# Patient Record
Sex: Male | Born: 1935 | Race: White | Hispanic: No | Smoking: Former smoker
Health system: Southern US, Community
[De-identification: ages and names within clinical notes are randomized; demographics above are authoritative.]

## PROBLEM LIST (undated history)

## (undated) DIAGNOSIS — M1990 Unspecified osteoarthritis, unspecified site: Secondary | ICD-10-CM

## (undated) DIAGNOSIS — M25511 Pain in right shoulder: Secondary | ICD-10-CM

## (undated) DIAGNOSIS — I4891 Unspecified atrial fibrillation: Secondary | ICD-10-CM

## (undated) DIAGNOSIS — C801 Malignant (primary) neoplasm, unspecified: Secondary | ICD-10-CM

## (undated) DIAGNOSIS — I10 Essential (primary) hypertension: Secondary | ICD-10-CM

## (undated) HISTORY — PX: CATARACT EXTRACTION W/ INTRAOCULAR LENS IMPLANT: SHX1309

## (undated) HISTORY — PX: OTHER SURGICAL HISTORY: SHX169

---

## 2012-09-23 ENCOUNTER — Other Ambulatory Visit (HOSPITAL_BASED_OUTPATIENT_CLINIC_OR_DEPARTMENT_OTHER): Payer: Self-pay | Admitting: Family Medicine

## 2012-09-23 DIAGNOSIS — M5412 Radiculopathy, cervical region: Secondary | ICD-10-CM

## 2012-09-26 ENCOUNTER — Ambulatory Visit (HOSPITAL_BASED_OUTPATIENT_CLINIC_OR_DEPARTMENT_OTHER)
Admission: RE | Admit: 2012-09-26 | Discharge: 2012-09-26 | Disposition: A | Discharge status: Home / Self Care | Payer: Medicare Other | Source: Ambulatory Visit | Attending: Family Medicine | Admitting: Family Medicine

## 2012-09-26 DIAGNOSIS — M5412 Radiculopathy, cervical region: Secondary | ICD-10-CM

## 2012-09-30 ENCOUNTER — Encounter (HOSPITAL_BASED_OUTPATIENT_CLINIC_OR_DEPARTMENT_OTHER): Payer: Self-pay | Admitting: *Deleted

## 2012-09-30 ENCOUNTER — Observation Stay (HOSPITAL_COMMUNITY)
Admission: EM | Admit: 2012-09-30 | Discharge: 2012-10-01 | Disposition: A | Discharge status: Home / Self Care | Payer: Medicare Other | Attending: Internal Medicine | Admitting: Internal Medicine

## 2012-09-30 ENCOUNTER — Emergency Department (HOSPITAL_BASED_OUTPATIENT_CLINIC_OR_DEPARTMENT_OTHER)
Admission: EM | Admit: 2012-09-30 | Discharge: 2012-09-30 | Disposition: A | Discharge status: Home / Self Care | Payer: Medicare Other | Attending: Emergency Medicine | Admitting: Emergency Medicine

## 2012-09-30 ENCOUNTER — Encounter (HOSPITAL_COMMUNITY): Payer: Self-pay | Admitting: Emergency Medicine

## 2012-09-30 DIAGNOSIS — I4891 Unspecified atrial fibrillation: Secondary | ICD-10-CM | POA: Insufficient documentation

## 2012-09-30 DIAGNOSIS — R000 Tachycardia, unspecified: Secondary | ICD-10-CM | POA: Insufficient documentation

## 2012-09-30 DIAGNOSIS — Z8679 Personal history of other diseases of the circulatory system: Secondary | ICD-10-CM | POA: Insufficient documentation

## 2012-09-30 DIAGNOSIS — R040 Epistaxis: Secondary | ICD-10-CM | POA: Insufficient documentation

## 2012-09-30 DIAGNOSIS — Z7901 Long term (current) use of anticoagulants: Secondary | ICD-10-CM | POA: Insufficient documentation

## 2012-09-30 DIAGNOSIS — E871 Hypo-osmolality and hyponatremia: Secondary | ICD-10-CM | POA: Insufficient documentation

## 2012-09-30 DIAGNOSIS — R42 Dizziness and giddiness: Secondary | ICD-10-CM | POA: Insufficient documentation

## 2012-09-30 DIAGNOSIS — I10 Essential (primary) hypertension: Secondary | ICD-10-CM | POA: Insufficient documentation

## 2012-09-30 DIAGNOSIS — Z79899 Other long term (current) drug therapy: Secondary | ICD-10-CM | POA: Insufficient documentation

## 2012-09-30 DIAGNOSIS — E860 Dehydration: Secondary | ICD-10-CM | POA: Diagnosis present

## 2012-09-30 DIAGNOSIS — E875 Hyperkalemia: Secondary | ICD-10-CM | POA: Diagnosis present

## 2012-09-30 DIAGNOSIS — M25511 Pain in right shoulder: Secondary | ICD-10-CM

## 2012-09-30 HISTORY — DX: Pain in right shoulder: M25.511

## 2012-09-30 HISTORY — DX: Unspecified atrial fibrillation: I48.91

## 2012-09-30 HISTORY — DX: Unspecified osteoarthritis, unspecified site: M19.90

## 2012-09-30 HISTORY — DX: Malignant (primary) neoplasm, unspecified: C80.1

## 2012-09-30 HISTORY — DX: Essential (primary) hypertension: I10

## 2012-09-30 LAB — BASIC METABOLIC PANEL
GFR calc Af Amer: 84 mL/min — ABNORMAL LOW (ref >90)
GFR calc non Af Amer: 72 mL/min — ABNORMAL LOW (ref >90)
Potassium: 5.3 mEq/L — ABNORMAL HIGH (ref 3.5–5.1)
Sodium: 133 mEq/L — ABNORMAL LOW (ref 135–145)

## 2012-09-30 LAB — PROTIME-INR
INR: 1.79 — ABNORMAL HIGH (ref 0.00–1.49)
Prothrombin Time: 20.2 seconds — ABNORMAL HIGH (ref 11.6–15.2)

## 2012-09-30 LAB — CBC WITH DIFFERENTIAL/PLATELET
Basophils Relative: 0 % (ref 0–1)
Eosinophils Absolute: 0.1 10*3/uL (ref 0.0–0.7)
Hemoglobin: 14.6 g/dL (ref 13.0–17.0)
Lymphs Abs: 2.1 10*3/uL (ref 0.7–4.0)
Lymphs Abs: 2.7 10*3/uL (ref 0.7–4.0)
MCH: 31.1 pg (ref 26.0–34.0)
Monocytes Relative: 10 % (ref 3–12)
Neutro Abs: 9.2 10*3/uL — ABNORMAL HIGH (ref 1.7–7.7)
Neutrophils Relative %: 65 % (ref 43–77)
Neutrophils Relative %: 69 % (ref 43–77)
Platelets: 206 10*3/uL (ref 150–400)
RBC: 4.7 MIL/uL (ref 4.22–5.81)
RBC: 4.84 MIL/uL (ref 4.22–5.81)

## 2012-09-30 MED ORDER — SODIUM CHLORIDE 0.9 % IV SOLN: INTRAVENOUS | Status: DC

## 2012-09-30 MED ORDER — METOPROLOL SUCCINATE ER 25 MG PO TB24
25.0000 mg | ORAL_TABLET | Freq: Every day | ORAL | Status: DC
  Administered 2012-10-01: 25 mg via ORAL
  Filled 2012-09-30: qty 1

## 2012-09-30 MED ORDER — ACETAMINOPHEN 650 MG RE SUPP: 650.0000 mg | Freq: Four times a day (QID) | RECTAL | Status: DC | PRN

## 2012-09-30 MED ORDER — SODIUM CHLORIDE 0.9 % IV SOLN
INTRAVENOUS | Status: DC
  Administered 2012-09-30 – 2012-10-01 (×2): via INTRAVENOUS

## 2012-09-30 MED ORDER — SODIUM CHLORIDE 0.9 % IV SOLN
INTRAVENOUS | Status: DC
  Administered 2012-09-30: 20:00:00 via INTRAVENOUS

## 2012-09-30 MED ORDER — TRAMADOL HCL 50 MG PO TABS: 50.0000 mg | ORAL_TABLET | Freq: Four times a day (QID) | ORAL | Status: DC | PRN

## 2012-09-30 MED ORDER — ONDANSETRON HCL 4 MG/2ML IJ SOLN: 4.0000 mg | Freq: Four times a day (QID) | INTRAMUSCULAR | Status: DC | PRN

## 2012-09-30 MED ORDER — ACETAMINOPHEN 325 MG PO TABS: 650.0000 mg | ORAL_TABLET | Freq: Four times a day (QID) | ORAL | Status: DC | PRN

## 2012-09-30 MED ORDER — SODIUM CHLORIDE 0.9 % IJ SOLN
3.0000 mL | Freq: Two times a day (BID) | INTRAMUSCULAR | Status: DC
  Administered 2012-09-30: 3 mL via INTRAVENOUS

## 2012-09-30 MED ORDER — ONDANSETRON HCL 4 MG PO TABS: 4.0000 mg | ORAL_TABLET | Freq: Four times a day (QID) | ORAL | Status: DC | PRN

## 2012-09-30 MED ORDER — ALBUTEROL SULFATE (5 MG/ML) 0.5% IN NEBU: 2.5000 mg | INHALATION_SOLUTION | RESPIRATORY_TRACT | Status: DC | PRN

## 2012-09-30 MED ORDER — SODIUM CHLORIDE 0.9 % IV SOLN
INTRAVENOUS | Status: DC
  Administered 2012-09-30: 22:00:00 via INTRAVENOUS

## 2012-09-30 MED ORDER — OXYMETAZOLINE HCL 0.05 % NA SOLN
NASAL | Status: AC
  Filled 2012-09-30: qty 15

## 2012-09-30 MED ORDER — OXYMETAZOLINE HCL 0.05 % NA SOLN
3.0000 | Freq: Once | NASAL | Status: AC
  Administered 2012-09-30: 3 via NASAL

## 2012-09-30 MED ORDER — EZETIMIBE-SIMVASTATIN 10-80 MG PO TABS
1.0000 | ORAL_TABLET | Freq: Every day | ORAL | Status: DC
  Administered 2012-10-01: 1 via ORAL
  Filled 2012-09-30 (×2): qty 1

## 2012-09-30 MED ORDER — SODIUM CHLORIDE 0.9 % IV BOLUS (SEPSIS)
500.0000 mL | Freq: Once | INTRAVENOUS | Status: AC
  Administered 2012-09-30: 500 mL via INTRAVENOUS

## 2012-09-30 NOTE — ED Notes (Signed)
MD at bedside. 

## 2012-09-30 NOTE — ED Notes (Signed)
Rhino rocket inserted into left nare by Dr Read Drivers, pt tolerated well.

## 2012-09-30 NOTE — ED Notes (Signed)
Unit Floor RN (Rm [id number]) unavailable for report at this time-- will be available in 15 minutes.

## 2012-09-30 NOTE — ED Notes (Signed)
Pt rec'd Afrin nasal spray by EMS prior to arrival.

## 2012-09-30 NOTE — ED Provider Notes (Signed)
History     CSN: [id number]  Arrival date & time 09/30/12  0555   First MD Initiated Contact with Patient 09/30/12 [phone]      Chief Complaint  Patient presents with  . Epistaxis    (Consider location/radiation/quality/duration/timing/severity/associated sxs/prior treatment) HPI This is an 78 year old male who awoke from sleep about 2 hours ago with left-sided epistaxis. He is not aware of any trauma to his nose. He states the bleeding was severe earlier but it is improved. EMS sprayed Afrin into his nose without relief. He continues to bleed on arrival. He denies pain. He states he is lightheaded but denies chest pain or shortness of breath. He denies nausea or vomiting.  Past Medical History  Diagnosis Date  . Hypertension   . Atrial fibrillation     History reviewed. No pertinent past surgical history.  History reviewed. No pertinent family history.  History  Substance Use Topics  . Smoking status: Not on file  . Smokeless tobacco: Not on file  . Alcohol Use: No      Review of Systems  All other systems reviewed and are negative.    Allergies  Penicillins  Home Medications   Current Outpatient Rx  Name Route Sig Dispense Refill  . CELECOXIB 100 MG PO CAPS Oral Take 100 mg by mouth 2 (two) times daily.    Marland Kitchen EZETIMIBE-SIMVASTATIN 10-10 MG PO TABS Oral Take 1 tablet by mouth at bedtime.    Marland Kitchen METOPROLOL SUCCINATE ER 100 MG PO TB24 Oral Take 100 mg by mouth daily. Take with or immediately following a meal.    . RIVAROXABAN 10 MG PO TABS Oral Take by mouth daily.    . TRAMADOL HCL 50 MG PO TABS Oral Take 50 mg by mouth every 6 (six) hours as needed.    . WARFARIN SODIUM 5 MG PO TABS Oral Take 5 mg by mouth daily.      BP 126/91  Pulse 73  Temp 98.6 F (37 C) (Oral)  Resp 22  SpO2 98%  Physical Exam General: Well-developed, well-nourished male in no acute distress; appearance consistent with age of record HENT: normocephalic, atraumatic; large clot in left  naris with active bleeding Eyes: pupils equal round and reactive to light; extraocular muscles intact; lens implant Neck: supple Heart: regular rate and rhythm; no murmurs Lungs: clear to auscultation bilaterally Abdomen: soft; nondistended; nontender Extremities: No deformity; full range of motion Neurologic: Awake, alert and oriented; motor function intact in all extremities and symmetric; no facial droop Skin: Warm and dry Psychiatric: Normal mood and affect    ED Course  Procedures (including critical care time)   NASAL PACKING After verbal informed consent was obtained a 5.5 cm Rapid Rhino was soaked in normal saline for a minute then placed into the left nostril and the balloon inflated with 5 mL of air. The patient tolerated this well and there were no complications. He appears hemostatic at this time (6:10 AM). Patient coughed up a large clot after placement of packing.   MDM   Nursing notes and vitals signs, including pulse oximetry, reviewed.  Summary of this visit's results, reviewed by myself:  Labs:  Results for orders placed during the hospital encounter of 09/30/12  PROTIME-INR      Component Value Range   Prothrombin Time 20.2 (*) 11.6 - 15.2 seconds   INR 1.79 (*) 0.00 - 1.49  CBC WITH DIFFERENTIAL      Component Value Range   WBC 9.3  4.0 -  10.5 K/uL   RBC 4.70  4.22 - 5.81 MIL/uL   Hemoglobin 14.6  13.0 - 17.0 g/dL   HCT 65.7  84.6 - 96.2 %   MCV 92.8  78.0 - 100.0 fL   MCH 31.1  26.0 - 34.0 pg   MCHC 33.5  30.0 - 36.0 g/dL   RDW 95.2  84.1 - 32.4 %   Platelets 206  150 - 400 K/uL   Neutrophils Relative 65  43 - 77 %   Neutro Abs 6.0  1.7 - 7.7 K/uL   Lymphocytes Relative 23  12 - 46 %   Lymphs Abs 2.1  0.7 - 4.0 K/uL   Monocytes Relative 11  3 - 12 %   Monocytes Absolute 1.0  0.1 - 1.0 K/uL   Eosinophils Relative 1  0 - 5 %   Eosinophils Absolute 0.1  0.0 - 0.7 K/uL   Basophils Relative 0  0 - 1 %   Basophils Absolute 0.0  0.0 - 0.1 K/uL    6:39 AM Hemostatic. Patient states his right nostril feels stuffed up, will administer Afrin.          Hanley Seamen, MD 09/30/12 [phone]

## 2012-09-30 NOTE — H&P (Addendum)
[facility] History and Physical  [name] [name] QMV:[id number] DOB: 07/25/1923 DOA: 09/30/2012  Referring physician: Dr. Cheri Guppy PCP: [name] Balls, MD  Specialists: None  Chief Complaint: Nosebleeding  HPI: [name] [name] is a 78 y.o. male with history of HTN, HDL, A. fib on Xarelto, colon cancer surgery presented to the ED for the second time with nosebleeding. He woke up at approximately 4 AM with bleeding from his left nostril. He described this as spurts of significant amount of blood. This was not controlled by squeezing nose with napkin. No history of trauma or use of heater at home. He presented to [facility]. Left nostril was packed with a Rhino Rocket and patient was discharged home. On returning home he continued to have mild trickle of blood from both nostrils but significantly improved compared to prior ED visit. However he also noticed trickling of blood in his throat with associated gagging. He called the ED and was advised to return and presented to the [facility] long emergency department. He continues to have mild blood on napkin when he sneezes. He complains of headache and lightheadedness. He has not eaten well all day. The hospitalist service is requested to admit him for further evaluation and management. He gives history of similar complaints approximately 3 weeks ago that subsided spontaneously at home.   Review of Systems: All systems reviewed and apart from history of presenting illness, are negative  Past Medical History  Diagnosis Date  . Hypertension   . Atrial fibrillation   . Cancer     colon ca - surgery, no chemo or radiation  . Arthritis   . Right shoulder pain 09/30/12    recently   Past Surgical History  Procedure Date  . Colon surgery for colon cancer approx. 2006  . Cataract extraction w/ intraocular lens implant     b/l   Social History:  reports that he quit smoking about 23 years ago. His smoking use included Cigarettes. He quit  after 35 years of use. He has never used smokeless tobacco. He reports that he does not drink alcohol or use illicit drugs. Patient is widowed. He lives alone and is independent of activities of daily living.  Allergies  Allergen Reactions  . Penicillins     Family History  Problem Relation Age of Onset  . Heart attack Father     Prior to Admission medications   Medication Sig Start Date End Date Taking? Authorizing Provider  CALCIUM PO Take 1 tablet by mouth daily.   Yes Historical Provider, MD  ezetimibe-simvastatin (VYTORIN) 10-80 MG per tablet Take 1 tablet by mouth at bedtime.   Yes Historical Provider, MD  losartan-hydrochlorothiazide (HYZAAR) 100-25 MG per tablet Take 1 tablet by mouth daily. E, calcium, zinc, fish oil   Yes Historical Provider, MD  metoprolol succinate (TOPROL-XL) 25 MG 24 hr tablet Take 25 mg by mouth daily.   Yes Historical Provider, MD  Multiple Vitamins-Minerals (ZINC PO) Take 1 tablet by mouth daily.   Yes Historical Provider, MD  Omega-3 Fatty Acids (FISH OIL PO) Take 1 capsule by mouth daily.   Yes Historical Provider, MD  Rivaroxaban (XARELTO) 20 MG TABS Take 20 mg by mouth daily.   Yes Historical Provider, MD  traMADol (ULTRAM) 50 MG tablet Take 50-100 mg by mouth every 6 (six) hours as needed. For pain   Yes Historical Provider, MD  VITAMIN E PO Take 1 capsule by mouth daily.   Yes Historical Provider, MD   Physical  Exam: Filed Vitals:   09/30/12 1638 09/30/12 2213  BP: 138/59 136/66  Pulse:  117  Temp: 98.2 F (36.8 C) 98.4 F (36.9 C)  TempSrc: Oral Oral  Resp: 18 22  SpO2: 100% 94%     General exam: Moderately built and nourished male patient was lying comfortably on the gurney and is in no obvious distress.  Head, eyes and ENT: Nontraumatic and normocephalic. Left nostril is packed with the Rhino Rocket-mildly soaked with blood. Mild dried blood in the left nostril. No blood-dried or fresh seen in the posterior pharyngeal wall. No  obvious active bleeding visible at this time oral mucosa is dry.   Neck: Supple. No JVD, carotid bruit or thyromegaly.  Lymphatics: No lymphadenopathy.  Respiratory system: Clear. No increased work of breathing.  Cardiovascular system: First and second heart sounds heard, regular. Mildly tachycardic. No JVD, murmurs or gallops.  Gastrointestinal system: Abdomen is nondistended, soft and nontender. Normal bowel sounds heard. No organomegaly or masses appreciated. Midline laparotomy scar.  Central nervous system: Alert and oriented. No focal neurological deficits.  Extremities: Symmetrical 5 x 5 power.  Skin exam: Negative for acute findings.  Musculoskeletal system: Negative exam.  Psychiatry: Pleasant and communicative.   Labs on Admission:  Basic Metabolic Panel:  Lab 09/30/12 1478  NA 133*  K 5.3*  CL 95*  CO2 24  GLUCOSE 117*  BUN 25*  CREATININE 0.97  CALCIUM 9.2  MG --  PHOS --   Liver Function Tests: No results found for this basename: AST:5,ALT:5,ALKPHOS:5,BILITOT:5,PROT:5,ALBUMIN:5 in the last 168 hours No results found for this basename: LIPASE:5,AMYLASE:5 in the last 168 hours No results found for this basename: AMMONIA:5 in the last 168 hours CBC:  Lab 09/30/12 1935 09/30/12 0608  WBC 13.4* 9.3  NEUTROABS 9.2* 6.0  HGB 14.6 14.6  HCT 44.9 43.6  MCV 92.8 92.8  PLT 201 206   Cardiac Enzymes: No results found for this basename: CKTOTAL:5,CKMB:5,CKMBINDEX:5,TROPONINI:5 in the last 168 hours  BNP (last 3 results) No results found for this basename: PROBNP:3 in the last 8760 hours CBG: No results found for this basename: GLUCAP:5 in the last 168 hours  Radiological Exams on Admission: No results found.  EKG: Shows baseline artifact. Possible sinus rhythm (p waves visible in lead V1) with frequent PACs. LAD. Left bundle branch block morphology. QTC 445 ms. No acute changes. No previous EKG to compare.  Assessment/Plan Principal Problem:  *Acute  anterior epistaxis Active Problems:  Dehydration  Hyperkalemia  Hyponatremia  HTN (hypertension)  A-fib   1. Acute anterior epistaxis, bilateral left greater than right: in the context of anticoagulation with Xarelto. Admit to telemetry bed for 23 hour observation and monitor for recurrent bleeding. Keep left nostril Rhino Rocket. Hold Xarelto. Consider ENT consultation on 10/31 AM to determine if he has any underlying cause for epistaxis which can be fixed so that he can resume his anticoagulation. May need to consult emergently if epistaxis worsens. Currently epistaxis seems to have diminished significantly. Hemodynamically stable and H&H stable. Will check orthostatics. 2. Dehydration: Secondary to poor oral intake secondary to problem #1. Brief IV fluid hydration and hold diuretics. 3. Mild hyponatremia and hyperkalemia: Likely secondary to ARB and HCTZ with associated mild dehydration. Hold those medications and briefly hydrate with IV fluids. Followup BMP in a.m. 4. Atrial fibrillation, with mild RVR: Monitor on telemetry. Continue Toprol. IV fluids. Check EKG. Hold anticoagulation secondary to problem #1. 5. Hypertension: Controlled. Continue Toprol.  Code Status: full  Family Communication:  discussed with patient and Ms. Celesta Aver, patient's friend and health care power of attorney-at the bedside  Disposition Plan:  DC home, possibly 10/31   Time spent:  45 minutes   [facility] Pager [phone]  If 7PM-7AM, please contact night-coverage [email or web address] Password [email or web address] 09/30/2012, 10:36 PM

## 2012-09-30 NOTE — ED Notes (Signed)
Pt's room assignment has just been changed--- awaiting approval.

## 2012-09-30 NOTE — ED Notes (Signed)
Pt's sister, Steward Drone, cell # [phone].

## 2012-09-30 NOTE — ED Provider Notes (Addendum)
History     CSN: [id number]  Arrival date & time 09/30/12  1515   First MD Initiated Contact with Patient 09/30/12 1847      Chief Complaint  Patient presents with  . Epistaxis    (Consider location/radiation/quality/duration/timing/severity/associated sxs/prior treatment) Patient is a 78 y.o. male presenting with nosebleeds. The history is provided by the patient and medical records.  Epistaxis    78 year old, male, on xerelto for atrial fibrillation, presents to emergency department complaining of lightheadedness , and weakness, and bleeding from his left nostril.  He had similar symptoms earlier today and was seen in the emergency department.  A Rhino Rocket was placed in his left nostril.  He denies shortness of breath.  He is no other symptoms.  Past Medical History  Diagnosis Date  . Hypertension   . Atrial fibrillation     History reviewed. No pertinent past surgical history.  History reviewed. No pertinent family history.  History  Substance Use Topics  . Smoking status: Not on file  . Smokeless tobacco: Not on file  . Alcohol Use: No      Review of Systems  HENT: Positive for nosebleeds.   Respiratory: Negative for shortness of breath.   Neurological: Positive for weakness and light-headedness.  Hematological: Bruises/bleeds easily.    Allergies  Penicillins  Home Medications   Current Outpatient Rx  Name Route Sig Dispense Refill  . CALCIUM PO Oral Take 1 tablet by mouth daily.    Marland Kitchen EZETIMIBE-SIMVASTATIN 10-80 MG PO TABS Oral Take 1 tablet by mouth at bedtime.    Marland Kitchen LOSARTAN POTASSIUM-HCTZ 100-25 MG PO TABS Oral Take 1 tablet by mouth daily. E, calcium, zinc, fish oil    . METOPROLOL SUCCINATE ER 25 MG PO TB24 Oral Take 25 mg by mouth daily.    Marland Kitchen ZINC PO Oral Take 1 tablet by mouth daily.    Marland Kitchen FISH OIL PO Oral Take 1 capsule by mouth daily.    Marland Kitchen RIVAROXABAN 20 MG PO TABS Oral Take 20 mg by mouth daily.    . TRAMADOL HCL 50 MG PO TABS Oral Take  50-100 mg by mouth every 6 (six) hours as needed. For pain    . VITAMIN E PO Oral Take 1 capsule by mouth daily.      BP 138/59  Temp 98.2 F (36.8 C) (Oral)  Resp 18  SpO2 100%  Physical Exam  Nursing note and vitals reviewed. Constitutional: He is oriented to person, place, and time. He appears well-developed and well-nourished. No distress.  HENT:  Head: Normocephalic and atraumatic.  Mouth/Throat: Oropharynx is clear and moist. No oropharyngeal exudate.       rhinorhocket in left nare with surrounding crusted dark blood.   Op clear.  No bleeding from right nare  Eyes: Conjunctivae normal and EOM are normal.  Neck: Normal range of motion.  Cardiovascular: Normal rate and intact distal pulses.   No murmur heard. Pulmonary/Chest: Effort normal and breath sounds normal. He has no rales.  Abdominal: He exhibits no distension.  Musculoskeletal: Normal range of motion.  Neurological: He is alert and oriented to person, place, and time.  Skin: Skin is warm and dry.  Psychiatric: He has a normal mood and affect. Thought content normal.    ED Course  Procedures (including critical care time) history of epistaxis from left nare treated with Rhino Rocket.  No evidence of active bleeding, male.  He does complain of lightheadedness, or we will perform another CBC, and monitor  him   Labs Reviewed  BASIC METABOLIC PANEL  CBC WITH DIFFERENTIAL   No results found.   No diagnosis found.  9:08 PM Say he is still dizzy and weak with ha.  Says he lives alone.  Does not feel as if he can go home.  9:49 PM Spoke with Dr. Bennie Pierini.  He will admit for symptomatic epistaxis in 36 y male who lives alone.  MDM  Epistaxis, with lightheadedness        Cheri Guppy, MD 09/30/12 1930  Cheri Guppy, MD 09/30/12 2111  Cheri Guppy, MD 09/30/12 2149

## 2012-09-30 NOTE — ED Notes (Signed)
Call placed to [facility] for pt transport home.

## 2012-09-30 NOTE — ED Notes (Addendum)
Pt states that he has had a nosebleed since about 4 am this morning.  Went to [facility] this morning.  They packed it but it was still bleeding and they recommended that he come over here so we could cauderize it.  Pt not currently bleeding.  Has nose tampon in place with dried blood.

## 2012-09-30 NOTE — ED Notes (Signed)
Nosebleed since 4am, denies injury

## 2012-10-01 DIAGNOSIS — I10 Essential (primary) hypertension: Secondary | ICD-10-CM

## 2012-10-01 LAB — BASIC METABOLIC PANEL
GFR calc Af Amer: 74 mL/min — ABNORMAL LOW (ref >90)
GFR calc non Af Amer: 64 mL/min — ABNORMAL LOW (ref >90)
Glucose, Bld: 113 mg/dL — ABNORMAL HIGH (ref 70–99)
Potassium: 3.2 mEq/L — ABNORMAL LOW (ref 3.5–5.1)
Sodium: 136 mEq/L (ref 135–145)

## 2012-10-01 LAB — CBC
Hemoglobin: 13.6 g/dL (ref 13.0–17.0)
RBC: 4.3 MIL/uL (ref 4.22–5.81)

## 2012-10-01 MED ORDER — METOPROLOL SUCCINATE ER 50 MG PO TB24: 50.0000 mg | ORAL_TABLET | Freq: Every day | ORAL | Status: DC

## 2012-10-01 MED ORDER — RIVAROXABAN 20 MG PO TABS: 20.0000 mg | ORAL_TABLET | Freq: Every day | ORAL | Status: DC

## 2012-10-01 MED ORDER — CEPHALEXIN 500 MG PO CAPS
500.0000 mg | ORAL_CAPSULE | Freq: Two times a day (BID) | ORAL | Status: DC
  Administered 2012-10-01: 500 mg via ORAL
  Filled 2012-10-01 (×2): qty 1

## 2012-10-01 MED ORDER — METOPROLOL SUCCINATE ER 25 MG PO TB24
25.0000 mg | ORAL_TABLET | Freq: Once | ORAL | Status: AC
  Administered 2012-10-01: 25 mg via ORAL
  Filled 2012-10-01: qty 1

## 2012-10-01 MED ORDER — POTASSIUM CHLORIDE CRYS ER 20 MEQ PO TBCR
20.0000 meq | EXTENDED_RELEASE_TABLET | Freq: Once | ORAL | Status: AC
  Administered 2012-10-01: 20 meq via ORAL
  Filled 2012-10-01: qty 1

## 2012-10-01 MED ORDER — CEPHALEXIN 500 MG PO CAPS: 500.0000 mg | ORAL_CAPSULE | Freq: Two times a day (BID) | ORAL | Status: DC

## 2012-10-01 NOTE — Discharge Summary (Signed)
Physician Discharge Summary  [name] [name] GMW:[id number] DOB: 1923/03/30 DOA: 09/30/2012  PCP: Almedia Balls, MD  Admit date: 09/30/2012 Discharge date: 10/01/2012  Recommendations for Outpatient Follow-up:  Followup with Almedia Balls, MD (PCP) in 1 week for nose bleed and afib (metoprolol dose increased).  Please followup with Dr. Pollyann Kennedy (ENT) as already arranged on 10/05/2012.  Do not start Xarelto (Rivaroxaban) until 10/05/2012, please discuss with Dr. Pollyann Kennedy if it is safe to resume the medication that day.  Discharge Diagnoses:  Principal Problem:  *Acute anterior epistaxis Active Problems:  Dehydration  Hyperkalemia  Hyponatremia  HTN (hypertension)  A-fib   Discharge Condition: Stable  Diet recommendation: Heart healthy diet.  Filed Weights   09/30/12 2336  Weight: 131.588 kg (290 lb 1.6 oz)    History of present illness:  On admission: "[name] [name] is a 78 y.o. male with history of HTN, HDL, A. fib on Xarelto, colon cancer surgery presented to the ED for the second time with nosebleeding. He woke up at approximately 4 AM with bleeding from his left nostril. He described this as spurts of significant amount of blood. This was not controlled by squeezing nose with napkin. No history of trauma or use of heater at home. [facility]. Left nostril was packed with a Rhino Rocket and patient was discharged home. On returning home he continued to have mild trickle of blood from both nostrils but significantly improved compared to prior ED visit. However he also noticed trickling of blood in his throat with associated gagging. He called the ED and was advised to return and presented to the [facility] long emergency department. He continues to have mild blood on napkin when he sneezes. He complains of headache and lightheadedness. He has not eaten well all day. The hospitalist service is requested to admit him for further evaluation and management. He gives history of  similar complaints approximately 3 weeks ago that subsided spontaneously at home."  Hospital Course:  Acute epistaxis, bilateral left greater than right in the setting of anticoagulation with Xarelto. Discussed with Dr. Pollyann Kennedy, ENT, recommended leaving the [facility] for atleast 5 days and antibiotics covering for staph to prevent toxic shock syndrome while on the [employer]. Start empiric Keflex for 5 days. Patient to hold Xarelto until 10/05/2012, to be resumed after being evaluated by Dr. Pollyann Kennedy. Patient not orthostatic. Hemoglobin has been stable over 24 hours. Patient to followup with Dr. Pollyann Kennedy on 10/05/2012 at 1:00 PM.  Dehydration Secondary to poor oral intake secondary epistaxis. Resolved with IV hydration, resume home diuretics.   Mild hyponatremia and hyperkalemia Resolved with IV hydration. Patient now hypokalemic. Will replace as needed.   Atrial fibrillation, with mild RVR Patient's heart rate not well controlled with activity, HR goes up to 180s. Doubled the dose of metoprolol. Resume anticoagulation after ENT clinic evaluation by Dr. Pollyann Kennedy. Further titration of metoprolol as per PCP.  Hypertension Stable. Continue Toprol and home medications.  Procedures:  As above.  Consultations:  Dr. Pollyann Kennedy, ENT on telephone.  Discharge Exam: Filed Vitals:   10/01/12 1100 10/01/12 1118 10/01/12 1119 10/01/12 1120  BP: 116/70 134/72 127/68 143/91  Pulse: 79   127  Temp: 98.1 F (36.7 C)   98 F (36.7 C)  TempSrc: Oral   Oral  Resp: 16 18 18 24   Height:      Weight:      SpO2: 99% 95% 93% 94%    Discharge Instructions  Discharge Orders    Future  Orders Please Complete By Expires   Diet - low sodium heart healthy      Increase activity slowly      Discharge instructions      Comments:   Followup with KELLY,SAM, MD (PCP) in 1 week for nose bleed and afib (metoprolol dose increased).  Please followup with Dr. Pollyann Kennedy (ENT) as already arranged on 10/05/2012.  Do not start  Xarelto (Rivaroxaban) until 10/05/2012, please discuss with Dr. Pollyann Kennedy if it is safe to resume the medication that day.       Medication List     As of 10/01/2012 12:35 PM    TAKE these medications         CALCIUM PO   Take 1 tablet by mouth daily.      cephALEXin 500 MG capsule   Commonly known as: KEFLEX   Take 1 capsule (500 mg total) by mouth every 12 (twelve) hours.      ezetimibe-simvastatin 10-80 MG per tablet   Commonly known as: VYTORIN   Take 1 tablet by mouth at bedtime.      FISH OIL PO   Take 1 capsule by mouth daily.      losartan-hydrochlorothiazide 100-25 MG per tablet   Commonly known as: HYZAAR   Take 1 tablet by mouth daily. E, calcium, zinc, fish oil      metoprolol succinate 50 MG 24 hr tablet   Commonly known as: TOPROL-XL   Take 1 tablet (50 mg total) by mouth daily. Take with or immediately following a meal.      Rivaroxaban 20 MG Tabs   Commonly known as: XARELTO   Take 1 tablet (20 mg total) by mouth daily. Please resume on 10/05/2012 after ENT evaluation by Dr. Pollyann Kennedy, please discuss with Dr. Pollyann Kennedy prior to taking this medication.      traMADol 50 MG tablet   Commonly known as: ULTRAM   Take 50-100 mg by mouth every 6 (six) hours as needed. For pain      VITAMIN E PO   Take 1 capsule by mouth daily.      ZINC PO   Take 1 tablet by mouth daily.           Follow-up Information    Follow up with Serena Colonel, MD. On 10/05/2012. (1:00 PM, Call Dr. Pollyann Kennedy if any further bleeding.)    Contact information:   [address], [address] [place] [address] [phone]       Follow up with Almedia Balls, MD. Schedule an appointment as soon as possible for a visit today.   Contact information:   [address] [place] [address] [phone]           The results of significant diagnostics from this hospitalization (including imaging, microbiology, ancillary and laboratory) are listed below for  reference.    Significant Diagnostic Studies: No results found.  Microbiology: No results found for this or any previous visit (from the past 240 hour(s)).   Labs: Basic Metabolic Panel:  Lab 10/01/12 6606 09/30/12 1935  NA 136 133*  K 3.2* 5.3*  CL 98 95*  CO2 25 24  GLUCOSE 113* 117*  BUN 22 25*  CREATININE 1.02 0.97  CALCIUM 8.8 9.2  MG -- --  PHOS -- --   Liver Function Tests: No results found for this basename: AST:5,ALT:5,ALKPHOS:5,BILITOT:5,PROT:5,ALBUMIN:5 in the last 168 hours No results found for this basename: LIPASE:5,AMYLASE:5 in the last 168 hours No results  found for this basename: AMMONIA:5 in the last 168 hours CBC:  Lab 10/01/12 0514 09/30/12 1935 09/30/12 0608  WBC 11.6* 13.4* 9.3  NEUTROABS -- 9.2* 6.0  HGB 13.6 14.6 14.6  HCT 40.1 44.9 43.6  MCV 93.3 92.8 92.8  PLT 199 201 206   Cardiac Enzymes: No results found for this basename: CKTOTAL:5,CKMB:5,CKMBINDEX:5,TROPONINI:5 in the last 168 hours BNP: BNP (last 3 results) No results found for this basename: PROBNP:3 in the last 8760 hours CBG: No results found for this basename: GLUCAP:5 in the last 168 hours   Time spent: 25 minutes  Signed:  Elowyn Raupp A  [facility] 10/01/2012, 12:35 PM

## 2012-10-01 NOTE — Progress Notes (Signed)
Up to bathroom, heart rate up to 183.  Dr Betti Cruz informed and new orders noted.

## 2012-10-01 NOTE — Care Management Note (Signed)
    Page 1 of 1   10/01/2012     1:46:44 PM   CARE MANAGEMENT NOTE 10/01/2012  Patient:  [name] [name]   Account Number:  [id number]  Date Initiated:  10/01/2012  Documentation initiated by:  Lorenda Ishihara  Subjective/Objective Assessment:   78 yo male admitted over night with epitaxis. PTA lived at home alone.     Action/Plan:   home when stable   Anticipated DC Date:  10/01/2012   Anticipated DC Plan:  HOME/SELF CARE      DC Planning Services  CM consult      Choice offered to / List presented to:             Status of service:  Completed, signed off Medicare Important Message given?   (If response is "NO", the following Medicare IM given date fields will be blank) Date Medicare IM given:   Date Additional Medicare IM given:    Discharge Disposition:  HOME/SELF CARE  Per UR Regulation:  Reviewed for med. necessity/level of care/duration of stay  If discussed at Long Length of Stay Meetings, dates discussed:    Comments:

## 2012-10-01 NOTE — Progress Notes (Signed)
Pt and POA were given discharge instructions, prescriptions, and follow-up appt. Info. Pt and POA verbalized understanding of all discharge instructions and proper care of rhino rocket. Pt and POA also verbalized understanding of his new medication and administration instructions. Pt and POA had no questions or concerns. Orson Ape D 10/01/2012

## 2012-10-01 NOTE — Progress Notes (Signed)
ekg at 0630 afib with avr (heart rate 101) seen by rapid response Rn,  Asymptomatic vss afebrile.  Pt rate at 170 during ambuation/during bowl movement prior to ekg.  No recommendations from rapid response rn

## 2012-10-01 NOTE — Progress Notes (Signed)
Subjective: No further epistaxis. Patient tachycardic with HR of 180s while ambulating, but improved with rest.  Objective: Vital signs in last 24 hours: Filed Vitals:   09/30/12 2242 09/30/12 2244 09/30/12 2336 10/01/12 0624  BP: 135/65 125/97 156/90 129/66  Pulse: 107 126 116 94  Temp:   99.1 F (37.3 C) 98.5 F (36.9 C)  TempSrc:   Oral Oral  Resp:   18 16  Height:   6' (1.829 m)   Weight:   131.588 kg (290 lb 1.6 oz)   SpO2:   95% 93%   Weight change:   Intake/Output Summary (Last 24 hours) at 10/01/12 1027 Last data filed at 10/01/12 0500  Gross per 24 hour  Intake 396.25 ml  Output      0 ml  Net 396.25 ml    Physical Exam: General: Awake, Oriented, No acute distress. HEENT: EOMI, rhino rocket in place. Neck: Supple CV: Irregularly irregular. Lungs: Clear to ascultation bilaterally Abdomen: Soft, Nontender, Nondistended, +bowel sounds. Ext: Good pulses. Trace edema.  Lab Results: Basic Metabolic Panel:  Lab 10/01/12 1610 09/30/12 1935  NA 136 133*  K 3.2* 5.3*  CL 98 95*  CO2 25 24  GLUCOSE 113* 117*  BUN 22 25*  CREATININE 1.02 0.97  CALCIUM 8.8 9.2  MG -- --  PHOS -- --   Liver Function Tests: No results found for this basename: AST:5,ALT:5,ALKPHOS:5,BILITOT:5,PROT:5,ALBUMIN:5 in the last 168 hours No results found for this basename: LIPASE:5,AMYLASE:5 in the last 168 hours No results found for this basename: AMMONIA:5 in the last 168 hours CBC:  Lab 10/01/12 0514 09/30/12 1935 09/30/12 0608  WBC 11.6* 13.4* 9.3  NEUTROABS -- 9.2* 6.0  HGB 13.6 14.6 14.6  HCT 40.1 44.9 43.6  MCV 93.3 92.8 92.8  PLT 199 201 206   Cardiac Enzymes: No results found for this basename: CKTOTAL:5,CKMB:5,CKMBINDEX:5,TROPONINI:5 in the last 168 hours BNP (last 3 results) No results found for this basename: PROBNP:3 in the last 8760 hours CBG: No results found for this basename: GLUCAP:5 in the last 168 hours No results found for this basename: HGBA1C:5 in the  last 72 hours Other Labs: No components found with this basename: POCBNP:3 No results found for this basename: DDIMER:2 in the last 168 hours No results found for this basename: CHOL:2,HDL:2,LDLCALC:2,TRIG:2,CHOLHDL:2,LDLDIRECT:2 in the last 168 hours No results found for this basename: TSH,T4TOTAL,FREET3,T3FREE,FREET4,THYROIDAB in the last 168 hours No results found for this basename: VITAMINB12:2,FOLATE:2,FERRITIN:2,TIBC:2,IRON:2,RETICCTPCT:2 in the last 168 hours  Micro Results: No results found for this or any previous visit (from the past 240 hour(s)).  Studies/Results: No results found.  Medications: I have reviewed the patient's current medications. Scheduled Meds:   . ezetimibe-simvastatin  1 tablet Oral QHS  . metoprolol succinate  25 mg Oral Daily  . sodium chloride  500 mL Intravenous Once  . sodium chloride  3 mL Intravenous Q12H  . DISCONTD: sodium chloride   Intravenous STAT   Continuous Infusions:   . sodium chloride 75 mL/hr at 09/30/12 2343  . DISCONTD: sodium chloride Stopped (09/30/12 2137)  . DISCONTD: sodium chloride 150 mL/hr at 09/30/12 2218   PRN Meds:.acetaminophen, acetaminophen, albuterol, ondansetron (ZOFRAN) IV, ondansetron, traMADol  Assessment/Plan: Acute anterior epistaxis, bilateral left greater than right in the setting of anticoagulation with Xarelto. Discussed with Dr. Pollyann Kennedy, ENT, recommended leaving the [facility] for atleast 5 days and antibiotics covering for staph to prevent toxic shock syndrome while on the [employer]. Start empiric Keflex. Patient to hold Xarelto until 10/05/2012,  to be resumed after being evaluated by Dr. Pollyann Kennedy. Patient not orthostatic. Hemoglobin has been stable over 24 hours.  Dehydration Secondary to poor oral intake secondary to problem #1. Resolved with IV hydration, resume home diuretics.   Mild hyponatremia and hyperkalemia Resolved with IV hydration. Patient now hypokalemic. Will replace as needed.    Atrial fibrillation, with mild RVR Patient's heart rate not well controlled with activity, HR goes up to 180s. Doubled the dose of metoprolol. Resume anticoagulation after ENT clinic evaluation by Dr. Pollyann Kennedy. Further titration of metoprolol as per PCP.  Hypertension Stable. Continue Toprol and home medications.  Code Status: full  Family Communication: Discussed with patient. Disposition Plan: DC home today.    LOS: 1 day  Corey Caulfield A, MD 10/01/2012, 10:27 AM

## 2019-09-15 ENCOUNTER — Encounter (HOSPITAL_COMMUNITY): Payer: Self-pay | Admitting: Emergency Medicine

## 2019-09-15 ENCOUNTER — Emergency Department (HOSPITAL_COMMUNITY): Payer: Medicare HMO

## 2019-09-15 ENCOUNTER — Other Ambulatory Visit: Payer: Self-pay

## 2019-09-15 ENCOUNTER — Inpatient Hospital Stay (HOSPITAL_COMMUNITY)
Admission: EM | Admit: 2019-09-15 | Discharge: 2019-09-28 | DRG: 674 | Disposition: A | Discharge status: Skilled Nursing Facility | Payer: Medicare HMO | Attending: Surgery | Admitting: Surgery

## 2019-09-15 DIAGNOSIS — Z8249 Family history of ischemic heart disease and other diseases of the circulatory system: Secondary | ICD-10-CM | POA: Diagnosis not present

## 2019-09-15 DIAGNOSIS — S2249XA Multiple fractures of ribs, unspecified side, initial encounter for closed fracture: Secondary | ICD-10-CM

## 2019-09-15 DIAGNOSIS — S37022A Major contusion of left kidney, initial encounter: Secondary | ICD-10-CM | POA: Diagnosis present

## 2019-09-15 DIAGNOSIS — H9190 Unspecified hearing loss, unspecified ear: Secondary | ICD-10-CM | POA: Diagnosis present

## 2019-09-15 DIAGNOSIS — S2242XA Multiple fractures of ribs, left side, initial encounter for closed fracture: Secondary | ICD-10-CM | POA: Diagnosis present

## 2019-09-15 DIAGNOSIS — Z9049 Acquired absence of other specified parts of digestive tract: Secondary | ICD-10-CM

## 2019-09-15 DIAGNOSIS — S2239XA Fracture of one rib, unspecified side, initial encounter for closed fracture: Secondary | ICD-10-CM

## 2019-09-15 DIAGNOSIS — I959 Hypotension, unspecified: Secondary | ICD-10-CM | POA: Diagnosis present

## 2019-09-15 DIAGNOSIS — R58 Hemorrhage, not elsewhere classified: Secondary | ICD-10-CM | POA: Diagnosis present

## 2019-09-15 DIAGNOSIS — Z66 Do not resuscitate: Secondary | ICD-10-CM | POA: Diagnosis present

## 2019-09-15 DIAGNOSIS — Z7901 Long term (current) use of anticoagulants: Secondary | ICD-10-CM

## 2019-09-15 DIAGNOSIS — R159 Full incontinence of feces: Secondary | ICD-10-CM | POA: Diagnosis present

## 2019-09-15 DIAGNOSIS — Z87891 Personal history of nicotine dependence: Secondary | ICD-10-CM

## 2019-09-15 DIAGNOSIS — E785 Hyperlipidemia, unspecified: Secondary | ICD-10-CM | POA: Diagnosis present

## 2019-09-15 DIAGNOSIS — R0600 Dyspnea, unspecified: Secondary | ICD-10-CM | POA: Diagnosis not present

## 2019-09-15 DIAGNOSIS — M1990 Unspecified osteoarthritis, unspecified site: Secondary | ICD-10-CM | POA: Diagnosis present

## 2019-09-15 DIAGNOSIS — S37032A Laceration of left kidney, unspecified degree, initial encounter: Secondary | ICD-10-CM

## 2019-09-15 DIAGNOSIS — Z79891 Long term (current) use of opiate analgesic: Secondary | ICD-10-CM | POA: Diagnosis not present

## 2019-09-15 DIAGNOSIS — N280 Ischemia and infarction of kidney: Secondary | ICD-10-CM

## 2019-09-15 DIAGNOSIS — I4821 Permanent atrial fibrillation: Secondary | ICD-10-CM | POA: Diagnosis present

## 2019-09-15 DIAGNOSIS — D62 Acute posthemorrhagic anemia: Secondary | ICD-10-CM | POA: Diagnosis present

## 2019-09-15 DIAGNOSIS — Z880 Allergy status to penicillin: Secondary | ICD-10-CM | POA: Diagnosis not present

## 2019-09-15 DIAGNOSIS — I714 Abdominal aortic aneurysm, without rupture: Secondary | ICD-10-CM | POA: Diagnosis present

## 2019-09-15 DIAGNOSIS — W19XXXA Unspecified fall, initial encounter: Secondary | ICD-10-CM

## 2019-09-15 DIAGNOSIS — S37062A Major laceration of left kidney, initial encounter: Principal | ICD-10-CM | POA: Diagnosis present

## 2019-09-15 DIAGNOSIS — I10 Essential (primary) hypertension: Secondary | ICD-10-CM | POA: Diagnosis present

## 2019-09-15 DIAGNOSIS — K439 Ventral hernia without obstruction or gangrene: Secondary | ICD-10-CM | POA: Diagnosis present

## 2019-09-15 DIAGNOSIS — S0990XA Unspecified injury of head, initial encounter: Secondary | ICD-10-CM

## 2019-09-15 DIAGNOSIS — Z20828 Contact with and (suspected) exposure to other viral communicable diseases: Secondary | ICD-10-CM | POA: Diagnosis present

## 2019-09-15 DIAGNOSIS — W010XXA Fall on same level from slipping, tripping and stumbling without subsequent striking against object, initial encounter: Secondary | ICD-10-CM | POA: Diagnosis present

## 2019-09-15 DIAGNOSIS — N179 Acute kidney failure, unspecified: Secondary | ICD-10-CM | POA: Diagnosis not present

## 2019-09-15 DIAGNOSIS — Z79899 Other long term (current) drug therapy: Secondary | ICD-10-CM | POA: Diagnosis not present

## 2019-09-15 DIAGNOSIS — R0602 Shortness of breath: Secondary | ICD-10-CM

## 2019-09-15 DIAGNOSIS — Z85038 Personal history of other malignant neoplasm of large intestine: Secondary | ICD-10-CM | POA: Diagnosis not present

## 2019-09-15 DIAGNOSIS — S161XXA Strain of muscle, fascia and tendon at neck level, initial encounter: Secondary | ICD-10-CM

## 2019-09-15 HISTORY — PX: IR EMBO ART  VEN HEMORR LYMPH EXTRAV  INC GUIDE ROADMAPPING: IMG5450

## 2019-09-15 HISTORY — PX: IR US GUIDE VASC ACCESS RIGHT: IMG2390

## 2019-09-15 HISTORY — PX: IR RENAL SUPRASEL UNI S&I MOD SED: IMG655

## 2019-09-15 HISTORY — PX: IR FLUORO GUIDE CV LINE RIGHT: IMG2283

## 2019-09-15 LAB — CBC
HCT: 44.5 % (ref 39.0–52.0)
HCT: 44.9 % (ref 39.0–52.0)
Hemoglobin: 14.8 g/dL (ref 13.0–17.0)
Hemoglobin: 15.1 g/dL (ref 13.0–17.0)
MCH: 32.2 pg (ref 26.0–34.0)
MCH: 32.6 pg (ref 26.0–34.0)
MCHC: 33.3 g/dL (ref 30.0–36.0)
MCHC: 33.6 g/dL (ref 30.0–36.0)
MCV: 96.7 fL (ref 80.0–100.0)
MCV: 97 fL (ref 80.0–100.0)
Platelets: 207 10*3/uL (ref 150–400)
Platelets: 196 10*3/uL (ref 150–400)
RBC: 4.6 MIL/uL (ref 4.22–5.81)
RBC: 4.63 MIL/uL (ref 4.22–5.81)
RDW: 15 % (ref 11.5–15.5)
RDW: 15.3 % (ref 11.5–15.5)
WBC: 13.4 10*3/uL — ABNORMAL HIGH (ref 4.0–10.5)
WBC: 13.7 10*3/uL — ABNORMAL HIGH (ref 4.0–10.5)
nRBC: 0 % (ref 0.0–0.2)
nRBC: 0.1 % (ref 0.0–0.2)

## 2019-09-15 LAB — COMPREHENSIVE METABOLIC PANEL
ALT: 15 U/L (ref 0–44)
AST: 31 U/L (ref 15–41)
Albumin: 3.1 g/dL — ABNORMAL LOW (ref 3.5–5.0)
Alkaline Phosphatase: 51 U/L (ref 38–126)
Anion gap: 13 (ref 5–15)
BUN: 24 mg/dL — ABNORMAL HIGH (ref 8–23)
CO2: 23 mmol/L (ref 22–32)
Calcium: 9.1 mg/dL (ref 8.9–10.3)
Chloride: 101 mmol/L (ref 98–111)
Creatinine, Ser: 1.46 mg/dL — ABNORMAL HIGH (ref 0.61–1.24)
GFR calc Af Amer: 47 mL/min — ABNORMAL LOW (ref >60)
GFR calc non Af Amer: 41 mL/min — ABNORMAL LOW (ref >60)
Glucose, Bld: 150 mg/dL — ABNORMAL HIGH (ref 70–99)
Potassium: 4.2 mmol/L (ref 3.5–5.1)
Sodium: 137 mmol/L (ref 135–145)
Total Bilirubin: 1.2 mg/dL (ref 0.3–1.2)
Total Protein: 6.2 g/dL — ABNORMAL LOW (ref 6.5–8.1)

## 2019-09-15 LAB — CBC WITH DIFFERENTIAL/PLATELET
Abs Immature Granulocytes: 0.07 10*3/uL (ref 0.00–0.07)
Basophils Absolute: 0.1 10*3/uL (ref 0.0–0.1)
Basophils Relative: 1 %
Eosinophils Absolute: 0.1 10*3/uL (ref 0.0–0.5)
Eosinophils Relative: 1 %
HCT: 44.4 % (ref 39.0–52.0)
Hemoglobin: 14.7 g/dL (ref 13.0–17.0)
Immature Granulocytes: 1 %
Lymphocytes Relative: 16 %
Lymphs Abs: 1.6 10*3/uL (ref 0.7–4.0)
MCH: 32.7 pg (ref 26.0–34.0)
MCHC: 33.1 g/dL (ref 30.0–36.0)
MCV: 98.7 fL (ref 80.0–100.0)
Monocytes Absolute: 0.8 10*3/uL (ref 0.1–1.0)
Monocytes Relative: 8 %
Neutro Abs: 7.2 10*3/uL (ref 1.7–7.7)
Neutrophils Relative %: 73 %
Platelets: 231 10*3/uL (ref 150–400)
RBC: 4.5 MIL/uL (ref 4.22–5.81)
RDW: 13.6 % (ref 11.5–15.5)
WBC: 9.8 10*3/uL (ref 4.0–10.5)
nRBC: 0 % (ref 0.0–0.2)

## 2019-09-15 LAB — URINALYSIS, ROUTINE W REFLEX MICROSCOPIC
Bilirubin Urine: NEGATIVE
Glucose, UA: NEGATIVE mg/dL
Ketones, ur: 15 mg/dL — AB
Nitrite: POSITIVE — AB
Protein, ur: 100 mg/dL — AB
Specific Gravity, Urine: 1.01 (ref 1.005–1.030)
pH: 7 (ref 5.0–8.0)

## 2019-09-15 LAB — CK: Total CK: 90 U/L (ref 49–397)

## 2019-09-15 LAB — MAGNESIUM: Magnesium: 2 mg/dL (ref 1.7–2.4)

## 2019-09-15 LAB — LACTIC ACID, PLASMA: Lactic Acid, Venous: 2.2 mmol/L (ref 0.5–1.9)

## 2019-09-15 LAB — SARS CORONAVIRUS 2 BY RT PCR (HOSPITAL ORDER, PERFORMED IN ~~LOC~~ HOSPITAL LAB): SARS Coronavirus 2: NEGATIVE

## 2019-09-15 LAB — TROPONIN I (HIGH SENSITIVITY)
Troponin I (High Sensitivity): 11 ng/L (ref <18)
Troponin I (High Sensitivity): 11 ng/L (ref <18)

## 2019-09-15 LAB — ABO/RH: ABO/RH(D): O POS

## 2019-09-15 LAB — URINALYSIS, MICROSCOPIC (REFLEX): RBC / HPF: >50 RBC/hpf (ref 0–5)

## 2019-09-15 LAB — PROTIME-INR
INR: 2 — ABNORMAL HIGH (ref 0.8–1.2)
Prothrombin Time: 22.8 seconds — ABNORMAL HIGH (ref 11.4–15.2)

## 2019-09-15 LAB — PREPARE RBC (CROSSMATCH)

## 2019-09-15 MED ORDER — IOHEXOL 300 MG/ML  SOLN
100.0000 mL | Freq: Once | INTRAMUSCULAR | Status: AC | PRN
  Administered 2019-09-15: 15 mL via INTRA_ARTERIAL

## 2019-09-15 MED ORDER — PANTOPRAZOLE SODIUM 40 MG IV SOLR
40.0000 mg | Freq: Every day | INTRAVENOUS | Status: DC
  Administered 2019-09-15 – 2019-09-16 (×2): 40 mg via INTRAVENOUS
  Filled 2019-09-15 (×2): qty 40

## 2019-09-15 MED ORDER — BISACODYL 10 MG RE SUPP: 10.0000 mg | Freq: Every day | RECTAL | Status: DC | PRN

## 2019-09-15 MED ORDER — METHOCARBAMOL 1000 MG/10ML IJ SOLN
500.0000 mg | Freq: Four times a day (QID) | INTRAVENOUS | Status: DC | PRN
  Filled 2019-09-15: qty 5

## 2019-09-15 MED ORDER — FENTANYL CITRATE (PF) 100 MCG/2ML IJ SOLN
12.5000 ug | INTRAMUSCULAR | Status: DC | PRN
  Administered 2019-09-16: 03:00:00 12.5 ug via INTRAVENOUS
  Filled 2019-09-15: qty 2

## 2019-09-15 MED ORDER — GELATIN ABSORBABLE 12-7 MM EX MISC
CUTANEOUS | Status: AC
  Filled 2019-09-15: qty 1

## 2019-09-15 MED ORDER — ACETAMINOPHEN 325 MG PO TABS: 650.0000 mg | ORAL_TABLET | ORAL | Status: DC | PRN

## 2019-09-15 MED ORDER — ONDANSETRON HCL 4 MG/2ML IJ SOLN: 4.0000 mg | Freq: Four times a day (QID) | INTRAMUSCULAR | Status: DC | PRN

## 2019-09-15 MED ORDER — ONDANSETRON 4 MG PO TBDP: 4.0000 mg | ORAL_TABLET | Freq: Four times a day (QID) | ORAL | Status: DC | PRN

## 2019-09-15 MED ORDER — SODIUM CHLORIDE 0.9 % IV SOLN
10.0000 mL/h | Freq: Once | INTRAVENOUS | Status: AC
  Administered 2019-09-15: 10 mL/h via INTRAVENOUS

## 2019-09-15 MED ORDER — IOHEXOL 300 MG/ML  SOLN
100.0000 mL | Freq: Once | INTRAMUSCULAR | Status: AC | PRN
  Administered 2019-09-15: 60 mL via INTRA_ARTERIAL

## 2019-09-15 MED ORDER — FENTANYL CITRATE (PF) 100 MCG/2ML IJ SOLN
INTRAMUSCULAR | Status: AC | PRN
  Administered 2019-09-15 (×4): 25 ug via INTRAVENOUS

## 2019-09-15 MED ORDER — DOCUSATE SODIUM 100 MG PO CAPS
100.0000 mg | ORAL_CAPSULE | Freq: Two times a day (BID) | ORAL | Status: DC
  Administered 2019-09-18 – 2019-09-25 (×12): 100 mg via ORAL
  Filled 2019-09-15 (×16): qty 1

## 2019-09-15 MED ORDER — IOHEXOL 300 MG/ML  SOLN
80.0000 mL | Freq: Once | INTRAMUSCULAR | Status: AC | PRN
  Administered 2019-09-15: 12:00:00 80 mL via INTRAVENOUS

## 2019-09-15 MED ORDER — SODIUM CHLORIDE 0.9 % IV SOLN
INTRAVENOUS | Status: AC | PRN
  Administered 2019-09-15: 1000 mL via INTRAVENOUS

## 2019-09-15 MED ORDER — LIDOCAINE HCL 1 % IJ SOLN
INTRAMUSCULAR | Status: AC
  Filled 2019-09-15: qty 20

## 2019-09-15 MED ORDER — TRAMADOL HCL 50 MG PO TABS
50.0000 mg | ORAL_TABLET | Freq: Four times a day (QID) | ORAL | Status: DC | PRN
  Administered 2019-09-16 – 2019-09-23 (×3): 50 mg via ORAL
  Filled 2019-09-15 (×3): qty 1

## 2019-09-15 MED ORDER — METOPROLOL TARTRATE 5 MG/5ML IV SOLN: 5.0000 mg | Freq: Four times a day (QID) | INTRAVENOUS | Status: DC | PRN

## 2019-09-15 MED ORDER — PANTOPRAZOLE SODIUM 40 MG PO TBEC
40.0000 mg | DELAYED_RELEASE_TABLET | Freq: Every day | ORAL | Status: DC
  Administered 2019-09-17 – 2019-09-22 (×6): 40 mg via ORAL
  Filled 2019-09-15 (×6): qty 1

## 2019-09-15 MED ORDER — METOPROLOL TARTRATE 5 MG/5ML IV SOLN
5.0000 mg | Freq: Four times a day (QID) | INTRAVENOUS | Status: DC | PRN
  Administered 2019-09-15 – 2019-09-23 (×2): 5 mg via INTRAVENOUS
  Filled 2019-09-15 (×2): qty 5

## 2019-09-15 MED ORDER — MIDAZOLAM HCL 2 MG/2ML IJ SOLN
INTRAMUSCULAR | Status: AC | PRN
  Administered 2019-09-15 (×4): 0.5 mg via INTRAVENOUS

## 2019-09-15 MED ORDER — LIDOCAINE HCL (PF) 1 % IJ SOLN
INTRAMUSCULAR | Status: AC | PRN
  Administered 2019-09-15: 5 mL

## 2019-09-15 MED ORDER — DEXTROSE-NACL 5-0.9 % IV SOLN
INTRAVENOUS | Status: DC
  Administered 2019-09-15 – 2019-09-19 (×6): via INTRAVENOUS

## 2019-09-15 MED ORDER — FENTANYL CITRATE (PF) 100 MCG/2ML IJ SOLN
INTRAMUSCULAR | Status: AC
  Filled 2019-09-15: qty 2

## 2019-09-15 MED ORDER — MIDAZOLAM HCL 2 MG/2ML IJ SOLN
INTRAMUSCULAR | Status: AC
  Filled 2019-09-15: qty 2

## 2019-09-15 MED ORDER — SODIUM CHLORIDE 0.9 % IV BOLUS
500.0000 mL | Freq: Once | INTRAVENOUS | Status: AC
  Administered 2019-09-15: 10:00:00 500 mL via INTRAVENOUS

## 2019-09-15 MED ORDER — NOREPINEPHRINE 4 MG/250ML-% IV SOLN: 0.0000 ug/min | INTRAVENOUS | Status: DC

## 2019-09-15 NOTE — ED Notes (Addendum)
Dressing clean, dry and intact to R groin. R pedal pulse +2.

## 2019-09-15 NOTE — ED Notes (Signed)
R groin bandage intact. +2 pedal pulses.

## 2019-09-15 NOTE — ED Notes (Signed)
Called for hospital bed so pt may be more comfortable 

## 2019-09-15 NOTE — Procedures (Signed)
Pre-procedure Diagnosis: Left sided renal laceration Post-procedure Diagnosis: Same  Success image guided placement of a R IJ approach non-tunneled triple lumen CVC.   Post left renal arteriogram and percutaneous coil embolization of two segmental branches supplying ill defined areas of contrast extravasation and vessel irregularity of the mid pole of the left kidney.    Complications: None Immediate EBL: None  Keep right leg straight for 4 hrs.    Signed: Iroh Jeschke [id number] 09/15/2019, 4:53 PM

## 2019-09-15 NOTE — ED Notes (Signed)
R groin bandage clean, dry and intact. R pedal pulse remains +2.

## 2019-09-15 NOTE — ED Notes (Signed)
Called CT for update on patient exam.

## 2019-09-15 NOTE — ED Triage Notes (Signed)
Patient arrived via EMS. EDP at bedside. Fall,. Trip over rug, down for approx 1.5 hours. Reports pain in left ribs. Skin tear present bilateral elbow. Manual BP 82/52. Alert and oriented.

## 2019-09-15 NOTE — ED Notes (Signed)
R groin bandage intact. +2 pedal pulses. Pt continues to have frequent PVC's.

## 2019-09-15 NOTE — ED Notes (Signed)
Patient transported to CT 

## 2019-09-15 NOTE — H&P (Addendum)
[facility] Admission Note  [name] [name] 1923-01-18  [id number].    Requesting MD: Veryl Speak Chief Complaint/Reason for Consult: fall  HPI:  [name] [name] is a 85yo male PMH atrial fibrillation on xarelto (last dose 10/13 in PM), who presented to [facility] after suffering a ground level fall this morning around 0700. States that he tripped over his shoes and fell onto a clothes hamper. He struck his left chest then rolled onto the ground. He did also hit his head on the ground but denies headache or LOC. Unable to ambulate after fall. He was found shortly afterwards by a caregiver that checks on him a few times weekly and called EMS.  Patient was worked up by [place] and found to have left rib fractures 7-11 and a left renal laceration with possible active extravasation. CT head and neck negative for acute injury. Per patient his blood pressure typically runs a little low, but now he is persistently with SBP 70-80s. Complaining only of left sided chest pain and mild abdominal pain. Denies nausea, vomiting, SOB. Pain is worse with deep inspiration. Trauma surgery called for admission.  Lives at home alone, has multiple different caregivers that check on him periodically Ambulates with a walker  ROS: Review of Systems  Constitutional: Negative.   HENT: Positive for hearing loss.   Eyes: Negative.   Respiratory: Negative for shortness of breath and wheezing.   Cardiovascular: Negative.   Gastrointestinal: Positive for abdominal pain. Negative for nausea and vomiting.  Genitourinary: Negative.   Musculoskeletal: Positive for back pain.  Skin: Negative.   Neurological: Negative.  Negative for loss of consciousness and headaches.    All systems reviewed and otherwise negative except for as above  Family History  Problem Relation Age of Onset  . Heart attack Father     Past Medical History:  Diagnosis Date  . Arthritis   . Atrial fibrillation ([place])   . Cancer [facility])    colon ca -  surgery, no chemo or radiation  . Hypertension   . Right shoulder pain 09/30/12   recently    Past Surgical History:  Procedure Laterality Date  . CATARACT EXTRACTION W/ INTRAOCULAR LENS IMPLANT     b/l  . colon surgery for colon cancer  approx. 2006    Social History:  reports that he quit smoking about 29 years ago. His smoking use included cigarettes. He quit after 35.00 years of use. He has never used smokeless tobacco. He reports that he does not drink alcohol or use drugs.  Allergies:  Allergies  Allergen Reactions  . Penicillins     Pt reports that he has tolerated oral PCN before but reports that his arm swelled where he received a PCN shot.    (Not in a hospital admission)   Prior to Admission medications   Medication Sig Start Date End Date Taking? Authorizing Provider  CALCIUM PO Take 1 tablet by mouth daily.    [provider]  cephALEXin (KEFLEX) 500 MG capsule Take 1 capsule (500 mg total) by mouth every 12 (twelve) hours. 10/01/12   Bynum Bellows, MD  ezetimibe-simvastatin (VYTORIN) 10-80 MG per tablet Take 1 tablet by mouth at bedtime.    [provider]  losartan-hydrochlorothiazide (HYZAAR) 100-25 MG per tablet Take 1 tablet by mouth daily. E, calcium, zinc, fish oil    [provider]  metoprolol succinate (TOPROL-XL) 50 MG 24 hr tablet Take 1 tablet (50 mg total) by mouth daily. Take with or immediately  following a meal. 10/01/12   Bynum Bellows, MD  Multiple Vitamins-Minerals (ZINC PO) Take 1 tablet by mouth daily.    [provider]  Omega-3 Fatty Acids (FISH OIL PO) Take 1 capsule by mouth daily.    [provider]  Rivaroxaban (XARELTO) 20 MG TABS Take 1 tablet (20 mg total) by mouth daily. Please resume on 10/05/2012 after ENT evaluation by Dr. Constance Holster, please discuss with Dr. Constance Holster prior to taking this medication. 10/01/12   Bynum Bellows, MD  traMADol (ULTRAM) 50 MG tablet Take 50-100 mg by mouth every 6  (six) hours as needed. For pain    [provider]  VITAMIN E PO Take 1 capsule by mouth daily.    [provider]    Blood pressure (!) 76/46, pulse 88, temperature 97.8 F (36.6 C), resp. rate 18, height 6' (1.829 m), weight 131.6 kg, SpO2 97 %. Physical Exam: Physical Exam  Constitutional: He is oriented to person, place, and time and well-developed, well-nourished, and in no distress. No distress.  HENT:  Head: Normocephalic and atraumatic.  Right Ear: External ear normal.  Left Ear: External ear normal.  Nose: Nose normal.  Mouth/Throat: Oropharynx is clear and moist.  Eyes: Pupils are equal, round, and reactive to light. Conjunctivae and EOM are normal. No scleral icterus.  Neck: Normal range of motion. Neck supple. No tracheal deviation present.  Cardiovascular: Normal rate. An irregular rhythm present.  Pulses:      Radial pulses are 2+ on the right side and 2+ on the left side.       Dorsalis pedis pulses are 1+ on the right side and 1+ on the left side.  Pulmonary/Chest: Effort normal and breath sounds normal. No stridor. No respiratory distress. He has no wheezes. He exhibits tenderness.  Left lateral chest wall tender to palpation  Abdominal: Soft. Bowel sounds are normal. He exhibits no distension. There is no hepatosplenomegaly. A hernia is present. Hernia confirmed positive in the ventral area.  Mild diffuse tenderness mostly in the upper abdomen L>R  Musculoskeletal:     Comments: Calves soft and nontender without edema. Small abrasion anterior left knee, nontender. No pain with passive knee and hip ROM bilaterally  Neurological: He is alert and oriented to person, place, and time. No cranial nerve deficit. GCS score is 15.  Moving all 4 extremities, no gross motor deficits  Skin: Skin is warm and dry. He is not diaphoretic.  Psychiatric: Affect normal.  Nursing note and vitals reviewed.    Results for orders placed or performed during the hospital  encounter of 09/15/19 (from the past 48 hour(s))  CBC with Differential     Status: None   Collection Time: 09/15/19  9:22 AM  Result Value Ref Range   WBC 9.8 4.0 - 10.5 K/uL   RBC 4.50 4.22 - 5.81 MIL/uL   Hemoglobin 14.7 13.0 - 17.0 g/dL   HCT 44.4 39.0 - 52.0 %   MCV 98.7 80.0 - 100.0 fL   MCH 32.7 26.0 - 34.0 pg   MCHC 33.1 30.0 - 36.0 g/dL   RDW 13.6 11.5 - 15.5 %   Platelets 231 150 - 400 K/uL   nRBC 0.0 0.0 - 0.2 %   Neutrophils Relative % 73 %   Neutro Abs 7.2 1.7 - 7.7 K/uL   Lymphocytes Relative 16 %   Lymphs Abs 1.6 0.7 - 4.0 K/uL   Monocytes Relative 8 %   Monocytes Absolute 0.8 0.1 -  1.0 K/uL   Eosinophils Relative 1 %   Eosinophils Absolute 0.1 0.0 - 0.5 K/uL   Basophils Relative 1 %   Basophils Absolute 0.1 0.0 - 0.1 K/uL   Immature Granulocytes 1 %   Abs Immature Granulocytes 0.07 0.00 - 0.07 K/uL    Comment: Performed at [facility], [place] [address], [place], [place] [address]  Troponin I (High Sensitivity)     Status: None   Collection Time: 09/15/19  9:22 AM  Result Value Ref Range   Troponin I (High Sensitivity) 11 <18 ng/L    Comment: (NOTE) Elevated high sensitivity troponin I (hsTnI) values and significant  changes across serial measurements may suggest ACS but many other  chronic and acute conditions are known to elevate hsTnI results.  Refer to the "Links" section for chest pain algorithms and additional  guidance. Performed at [facility], [place] [address], [place], [place] [address]   Comprehensive metabolic panel     Status: Abnormal   Collection Time: 09/15/19  9:22 AM  Result Value Ref Range   Sodium 137 135 - 145 mmol/L   Potassium 4.2 3.5 - 5.1 mmol/L   Chloride 101 98 - 111 mmol/L   CO2 23 22 - 32 mmol/L   Glucose, Bld 150 (H) 70 - 99 mg/dL   BUN 24 (H) 8 - 23 mg/dL   Creatinine, Ser 1.46 (H) 0.61 - 1.24 mg/dL   Calcium 9.1 8.9 - 10.3 mg/dL   Total Protein 6.2 (L) 6.5 - 8.1 g/dL   Albumin 3.1 (L) 3.5 - 5.0 g/dL   AST 31  15 - 41 U/L   ALT 15 0 - 44 U/L   Alkaline Phosphatase 51 38 - 126 U/L   Total Bilirubin 1.2 0.3 - 1.2 mg/dL   GFR calc non Af Amer 41 (L) >60 mL/min   GFR calc Af Amer 47 (L) >60 mL/min   Anion gap 13 5 - 15    Comment: Performed at [facility] [address], [place], [place] [address]  Protime-INR     Status: Abnormal   Collection Time: 09/15/19  9:22 AM  Result Value Ref Range   Prothrombin Time 22.8 (H) 11.4 - 15.2 seconds   INR 2.0 (H) 0.8 - 1.2    Comment: (NOTE) INR goal varies based on device and disease states. Performed at [facility], [place] [address] [place], [place] [address]   CK     Status: None   Collection Time: 09/15/19  9:22 AM  Result Value Ref Range   Total CK 90 49 - 397 U/L    Comment: Performed at [facility], [place] [address] [place], [place] [address]  Lactic acid, plasma     Status: Abnormal   Collection Time: 09/15/19  9:24 AM  Result Value Ref Range   Lactic Acid, Venous 2.2 (HH) 0.5 - 1.9 mmol/L    Comment: CRITICAL RESULT CALLED TO, READ BACK BY AND VERIFIED WITH: Jenene Slicker RN [id number] BY A BENNETT Performed at [facility], [place] [address] [place], [place] [address]   Type and screen     Status: None (Preliminary result)   Collection Time: 09/15/19  9:48 AM  Result Value Ref Range   ABO/RH(D) O POS    Antibody Screen NEG    Sample Expiration 09/18/2019,2359    Unit Number [id number]    Blood Component Type RED CELLS,LR    Unit division 00  Status of Unit ISSUED    Transfusion Status OK TO TRANSFUSE    Crossmatch Result Compatible    Unit Number [id number]    Blood Component Type RED CELLS,LR    Unit division 00    Status of Unit ISSUED    Transfusion Status OK TO TRANSFUSE    Crossmatch Result      Compatible Performed at [facility], [address] [place], [place] [address]   ABO/Rh     Status: None   Collection Time: 09/15/19  9:48 AM  Result Value Ref Range   ABO/RH(D)      O  POS Performed at [facility] [address], [place], [place] [address]   Urinalysis, Routine w reflex microscopic     Status: Abnormal   Collection Time: 09/15/19 10:21 AM  Result Value Ref Range   Color, Urine RED (A) YELLOW    Comment: BIOCHEMICALS MAY BE AFFECTED BY COLOR   APPearance TURBID (A) CLEAR   Specific Gravity, Urine 1.010 1.005 - 1.030   pH 7.0 5.0 - 8.0   Glucose, UA NEGATIVE NEGATIVE mg/dL   Hgb urine dipstick LARGE (A) NEGATIVE   Bilirubin Urine NEGATIVE NEGATIVE   Ketones, ur 15 (A) NEGATIVE mg/dL   Protein, ur 100 (A) NEGATIVE mg/dL   Nitrite POSITIVE (A) NEGATIVE   Leukocytes,Ua TRACE (A) NEGATIVE    Comment: Performed at [facility], [place] [address], [place], [place] [address]  Urinalysis, Microscopic (reflex)     Status: Abnormal   Collection Time: 09/15/19 10:21 AM  Result Value Ref Range   RBC / HPF >50 0 - 5 RBC/hpf   WBC, UA 6-10 0 - 5 WBC/hpf   Bacteria, UA RARE (A) NONE SEEN   Squamous Epithelial / LPF 0-5 0 - 5    Comment: Performed at [facility], [place] [address] [place], [place] [address]  Troponin I (High Sensitivity)     Status: None   Collection Time: 09/15/19 11:22 AM  Result Value Ref Range   Troponin I (High Sensitivity) 11 <18 ng/L    Comment: (NOTE) Elevated high sensitivity troponin I (hsTnI) values and significant  changes across serial measurements may suggest ACS but many other  chronic and acute conditions are known to elevate hsTnI results.  Refer to the "Links" section for chest pain algorithms and additional  guidance. Performed at [facility], [place] [address], [place], [place] [address]   Prepare RBC     Status: None   Collection Time: 09/15/19  1:30 PM  Result Value Ref Range   Order Confirmation      ORDER PROCESSED BY BLOOD BANK Performed at [facility], [place] [address] [place], [place] [address]    Ct Head Wo Contrast  Result Date: 09/15/2019 CLINICAL DATA:  Trauma, fall EXAM: CT  HEAD WITHOUT CONTRAST CT CERVICAL SPINE WITHOUT CONTRAST TECHNIQUE: Multidetector CT imaging of the head and cervical spine was performed following the standard protocol without intravenous contrast. Multiplanar CT image reconstructions of the cervical spine were also generated. COMPARISON:  None. FINDINGS: CT HEAD FINDINGS Brain: No evidence of acute infarction, hemorrhage, hydrocephalus, extra-axial collection or mass lesion/mass effect. This periventricular and deep white matter hypodensity with global volume loss. Vascular: No hyperdense vessel or unexpected calcification. Skull: Normal. Negative for fracture or focal lesion. Sinuses/Orbits: No acute finding. Other: None. CT CERVICAL SPINE FINDINGS Alignment: Degenerative straightening of the normal cervical lordosis. Skull base and vertebrae:  No acute fracture. No primary bone lesion or focal pathologic process. Soft tissues and spinal canal: No prevertebral fluid or swelling. No visible canal hematoma. Disc levels: Severe multilevel disc degenerative disease and partial ankylosis of the cervical spine. Upper chest: Negative. Other: Aortic atherosclerosis. IMPRESSION: 1. No acute intracranial pathology. Small-vessel white matter disease and global volume loss in keeping with advanced patient age. 2. No fracture or static subluxation of the cervical spine. Severe multilevel disc degenerative disease. 3.  Aortic Atherosclerosis (ICD10-I70.0). Electronically Signed   By: Eddie Candle M.D.   On: 09/15/2019 12:20   Ct Chest W Contrast  Result Date: 09/15/2019 CLINICAL DATA:  Chest trauma, fall, pain in ribs and back, history of colon cancer EXAM: CT CHEST, ABDOMEN, AND PELVIS WITH CONTRAST TECHNIQUE: Multidetector CT imaging of the chest, abdomen and pelvis was performed following the standard protocol during bolus administration of intravenous contrast. CONTRAST:  43mL OMNIPAQUE IOHEXOL 300 MG/ML  SOLN COMPARISON:  None. FINDINGS: CT CHEST FINDINGS  Cardiovascular: Aortic atherosclerosis. Cardiomegaly. Three-vessel coronary artery calcifications. No pericardial effusion. Mediastinum/Nodes: No enlarged mediastinal, hilar, or axillary lymph nodes. Thyroid gland, trachea, and esophagus demonstrate no significant findings. Lungs/Pleura: Lungs are clear. Large left-sided diaphragmatic eventration. Trace left pleural effusion. Musculoskeletal: No chest wall mass or suspicious bone lesions identified. There are minimally displaced fractures of the lateral and posterior left seventh through eleventh ribs. CT ABDOMEN PELVIS FINDINGS Hepatobiliary: No solid liver abnormality is seen. Gallstones. No gallbladder wall thickening, or biliary dilatation. Pancreas: Unremarkable. No pancreatic ductal dilatation or surrounding inflammatory changes. Spleen: Normal in size without significant abnormality. Adrenals/Urinary Tract: Adrenal glands are unremarkable. There is a laceration and large hematoma of the inferior pole of the left kidney with multiple internal foci of contrast attenuation concerning for active extravasation (series 3, image 63, 69). There is extensive fluid and contrast in the perirenal fascia and retroperitoneal soft tissues overlying the psoas. Bladder is unremarkable. Stomach/Bowel: Stomach is within normal limits. No evidence of bowel wall thickening, distention, or inflammatory changes. Evidence of prior sigmoid resection and reanastomosis Vascular/Lymphatic: Aortic atherosclerosis. Infrarenal abdominal aortic aneurysm measuring 4.2 x 4.1 cm. No enlarged abdominal or pelvic lymph nodes. Reproductive: No mass or other abnormality. Other: There is a midline ventral hernia containing nonobstructed loops of small bowel, hernia sac measuring 11.2 x 3.7 cm and neck measuring 4.8 cm (series 3, image 93, series 7, image 112). No abdominopelvic ascites. Musculoskeletal: No acute or significant osseous findings. IMPRESSION: 1. There is a laceration and large  hematoma of the inferior pole of the left kidney with multiple internal foci of contrast attenuation concerning for active extravasation (series 3, image 63, 69). There is extensive fluid and contrast in the perirenal fascia and retroperitoneal soft tissues overlying the psoas. 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 2. There are minimally displaced fractures of the overlying lateral and posterior left seventh through eleventh ribs. 3.  Trace left pleural effusion. 4. 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 5. There is a midline ventral hernia containing nonobstructed loops of small bowel, hernia sac measuring 11.2 x 3.7 cm and neck measuring 4.8 cm (series 3, image 93, series 7, image 112). 6. Infrarenal abdominal aortic aneurysm measuring 4.2 x 4.1 cm. Aortic Atherosclerosis (ICD10-I70.0). 7.  Cardiomegaly and coronary artery disease. 8.  Evidence of prior sigmoid colon resection and reanastomosis. 9.  Cholelithiasis. These results were  called by telephone at the time of interpretation on 09/15/2019 at 12:44 pm to provider Veryl Speak , who verbally acknowledged these results. Electronically Signed   By: Eddie Candle M.D.   On: 09/15/2019 12:49   Ct Cervical Spine Wo Contrast  Result Date: 09/15/2019 CLINICAL DATA:  Trauma, fall EXAM: CT HEAD WITHOUT CONTRAST CT CERVICAL SPINE WITHOUT CONTRAST TECHNIQUE: Multidetector CT imaging of the head and cervical spine was performed following the standard protocol without intravenous contrast. Multiplanar CT image reconstructions of the cervical spine were also generated. COMPARISON:  None. FINDINGS: CT HEAD FINDINGS Brain: No evidence of acute infarction, hemorrhage, hydrocephalus, extra-axial collection or mass lesion/mass effect. This periventricular and deep white matter hypodensity with global volume loss.  Vascular: No hyperdense vessel or unexpected calcification. Skull: Normal. Negative for fracture or focal lesion. Sinuses/Orbits: No acute finding. Other: None. CT CERVICAL SPINE FINDINGS Alignment: Degenerative straightening of the normal cervical lordosis. Skull base and vertebrae: No acute fracture. No primary bone lesion or focal pathologic process. Soft tissues and spinal canal: No prevertebral fluid or swelling. No visible canal hematoma. Disc levels: Severe multilevel disc degenerative disease and partial ankylosis of the cervical spine. Upper chest: Negative. Other: Aortic atherosclerosis. IMPRESSION: 1. No acute intracranial pathology. Small-vessel white matter disease and global volume loss in keeping with advanced patient age. 2. No fracture or static subluxation of the cervical spine. Severe multilevel disc degenerative disease. 3.  Aortic Atherosclerosis (ICD10-I70.0). Electronically Signed   By: Eddie Candle M.D.   On: 09/15/2019 12:20   Ct Abdomen Pelvis W Contrast  Result Date: 09/15/2019 CLINICAL DATA:  Chest trauma, fall, pain in ribs and back, history of colon cancer EXAM: CT CHEST, ABDOMEN, AND PELVIS WITH CONTRAST TECHNIQUE: Multidetector CT imaging of the chest, abdomen and pelvis was performed following the standard protocol during bolus administration of intravenous contrast. CONTRAST:  37mL OMNIPAQUE IOHEXOL 300 MG/ML  SOLN COMPARISON:  None. FINDINGS: CT CHEST FINDINGS Cardiovascular: Aortic atherosclerosis. Cardiomegaly. Three-vessel coronary artery calcifications. No pericardial effusion. Mediastinum/Nodes: No enlarged mediastinal, hilar, or axillary lymph nodes. Thyroid gland, trachea, and esophagus demonstrate no significant findings. Lungs/Pleura: Lungs are clear. Large left-sided diaphragmatic eventration. Trace left pleural effusion. Musculoskeletal: No chest wall mass or suspicious bone lesions identified. There are minimally displaced fractures of the lateral and posterior  left seventh through eleventh ribs. CT ABDOMEN PELVIS FINDINGS Hepatobiliary: No solid liver abnormality is seen. Gallstones. No gallbladder wall thickening, or biliary dilatation. Pancreas: Unremarkable. No pancreatic ductal dilatation or surrounding inflammatory changes. Spleen: Normal in size without significant abnormality. Adrenals/Urinary Tract: Adrenal glands are unremarkable. There is a laceration and large hematoma of the inferior pole of the left kidney with multiple internal foci of contrast attenuation concerning for active extravasation (series 3, image 63, 69). There is extensive fluid and contrast in the perirenal fascia and retroperitoneal soft tissues overlying the psoas. Bladder is unremarkable. Stomach/Bowel: Stomach is within normal limits. No evidence of bowel wall thickening, distention, or inflammatory changes. Evidence of prior sigmoid resection and reanastomosis Vascular/Lymphatic: Aortic atherosclerosis. Infrarenal abdominal aortic aneurysm measuring 4.2 x 4.1 cm. No enlarged abdominal or pelvic lymph nodes. Reproductive: No mass or other abnormality. Other: There is a midline ventral hernia containing nonobstructed loops of small bowel, hernia sac measuring 11.2 x 3.7 cm and neck measuring 4.8 cm (series 3, image 93, series 7, image 112). No abdominopelvic ascites. Musculoskeletal: No acute or significant osseous findings. IMPRESSION: 1. There is a laceration and large hematoma of the inferior pole of the  left kidney with multiple internal foci of contrast attenuation concerning for active extravasation (series 3, image 63, 69). There is extensive fluid and contrast in the perirenal fascia and retroperitoneal soft tissues overlying the psoas. 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 2. There are minimally displaced fractures of the overlying lateral and posterior left seventh through eleventh ribs. 3.  Trace left pleural effusion. 4. Large eventration of the left  hemidiaphragm, likely chronic and incidental, traumatic elevation of the diaphragm for example due to phrenic nerve injury less favored. 5. There is a midline ventral hernia containing nonobstructed loops of small bowel, hernia sac measuring 11.2 x 3.7 cm and neck measuring 4.8 cm (series 3, image 93, series 7, image 112). 6. Infrarenal abdominal aortic aneurysm measuring 4.2 x 4.1 cm. Aortic Atherosclerosis (ICD10-I70.0). 7.  Cardiomegaly and coronary artery disease. 8.  Evidence of prior sigmoid colon resection and reanastomosis. 9.  Cholelithiasis. These results were called by telephone at the time of interpretation on 09/15/2019 at 12:44 pm to provider Veryl Speak , who verbally acknowledged these results. Electronically Signed   By: Eddie Candle M.D.   On: 09/15/2019 12:49   Dg Chest Port 1 View  Result Date: 09/15/2019 CLINICAL DATA:  Fall with chest pain EXAM: PORTABLE CHEST 1 VIEW COMPARISON:  Chest CT report 12/26/2009 FINDINGS: Low volume chest with generalized interstitial coarsening. There is asymmetric elevation of the left diaphragm not described on prior study. Borderline heart size given portable technique and rightward rotation. Aortic tortuosity. IMPRESSION: 1. Low volume chest interstitial coarsening, question pulmonary fibrosis. 2. Prominent elevation of the left diaphragm. 3. No definite acute finding. Electronically Signed   By: Monte Fantasia M.D.   On: 09/15/2019 10:06      Assessment/Plan Fall L rib fxs 7-11 - pain control and pulm toilet L kidney lac with possible active extravasation and large hematoma - IR for possible embolization Hypotension - likely 2/2 blood loss from above, although initial Hgb 14.7. Getting 2 units PRBCs, monitor Elevated creatinine - Cr 1.46, unsure of baseline. IVF, monitor Atrial fibrillation - on xarelto (last dose 10/13 in PM) - hold xarelto HTN HLD H/o colon cancer s/p partial colectomy ~2006 Ventral hernia Infrarenal AAA  4.2x4.1cm Arthritis - take tramadol PRN at home  ID - none VTE - SCDs FEN - IVF, NPO Foley - none Follow up - TBD  Plan - Admit to ICU. IR for possible embolization. Hold xarelto.  Wellington Hampshire, [facility] 09/15/2019, 1:23 PM Please see Amion for pager number during day hours 7:00am-4:30pm

## 2019-09-15 NOTE — ED Notes (Signed)
R groin dressing intact. +2 pedal pulse RLE.

## 2019-09-15 NOTE — ED Notes (Addendum)
Dr. Bobbye Morton at bedside explaining procedure.

## 2019-09-15 NOTE — ED Notes (Signed)
Pt saturation ~77% RA sleeping. Pt put on supplemental O2 @ 2LPM via [place].

## 2019-09-15 NOTE — ED Notes (Signed)
Patient returned from [place]. No distress observed.

## 2019-09-15 NOTE — ED Notes (Signed)
EDP at bedside with trauma consult MD.

## 2019-09-15 NOTE — ED Notes (Signed)
Patient request that family friend Iran Planas sign blood consent after verbal consent. Reports being unable to see signature pad.

## 2019-09-15 NOTE — ED Notes (Signed)
R groin site clean, dry and intact. Dressing in place. Pedal pulse RLE +2.

## 2019-09-15 NOTE — Consult Note (Signed)
Chief Complaint: Patient was seen in consultation today for renal embolization.  Supervising Physician: Sandi Mariscal  Patient Status: [facility] - ED  History of Present Illness: [name] [name] is a 85 y.o. male with a past medical history significant for arthritis, colon cancer, HTN and a.fib on Xarelto (last dose 10/13 PM) who presented to [facility] ED via EMS this morning after a fall at home. Per patient he tripped over a rug and hit his left chest on a laundry basket around 7 am, he fell to the ground and hit his head. He was unable to get up for approximately 1.5 hours and was found by a caregiver who called EMS.   In the ED he was found to have left rib fractures 7-11 and a left renal laceration with possible active extravasation, CT head/neck negative for acute injury. He was also noted to be significantly hypotensive. He was seen by trauma surgery and IR has been consulted for a possible renal angiogram with embolization.  Patient seen with family friend [name] [name] at bedside, he is hard of hearing but is otherwise a good historian. He is able to tell me that he is having left sided chest pain after hitting his chest on a laundry basket and some minimal abdominal pain on the left side as well, but denies any other complaints at this time. He states understanding of the procedure and gives verbal consent to proceed, he requests that family friend [name] [name] sign his consent form as he has trouble seeing and holding a pen.   Past Medical History:  Diagnosis Date   Arthritis    Atrial fibrillation ([place])    Cancer ([place])    colon ca - surgery, no chemo or radiation   Hypertension    Right shoulder pain 09/30/12   recently    Past Surgical History:  Procedure Laterality Date   CATARACT EXTRACTION W/ INTRAOCULAR LENS IMPLANT     b/l   colon surgery for colon cancer  approx. 2006    Allergies: Penicillins  Medications: Prior to Admission medications   Medication Sig Start Date End Date  Taking? Authorizing Provider  CALCIUM PO Take 1 tablet by mouth daily.    [provider]  cephALEXin (KEFLEX) 500 MG capsule Take 1 capsule (500 mg total) by mouth every 12 (twelve) hours. 10/01/12   Bynum Bellows, MD  ezetimibe-simvastatin (VYTORIN) 10-80 MG per tablet Take 1 tablet by mouth at bedtime.    [provider]  losartan-hydrochlorothiazide (HYZAAR) 100-25 MG per tablet Take 1 tablet by mouth daily. E, calcium, zinc, fish oil    [provider]  metoprolol succinate (TOPROL-XL) 50 MG 24 hr tablet Take 1 tablet (50 mg total) by mouth daily. Take with or immediately following a meal. 10/01/12   Bynum Bellows, MD  Multiple Vitamins-Minerals (ZINC PO) Take 1 tablet by mouth daily.    [provider]  Omega-3 Fatty Acids (FISH OIL PO) Take 1 capsule by mouth daily.    [provider]  Rivaroxaban (XARELTO) 20 MG TABS Take 1 tablet (20 mg total) by mouth daily. Please resume on 10/05/2012 after ENT evaluation by Dr. Constance Holster, please discuss with Dr. Constance Holster prior to taking this medication. 10/01/12   Bynum Bellows, MD  traMADol (ULTRAM) 50 MG tablet Take 50-100 mg by mouth every 6 (six) hours as needed. For pain    [provider]  VITAMIN E PO Take 1 capsule by mouth daily.    [provider]  Family History  Problem Relation Age of Onset   Heart attack Father     Social History   Socioeconomic History   Marital status: Widowed    Spouse name: Not on file   Number of children: Not on file   Years of education: Not on file   Highest education level: Not on file  Occupational History   Not on file  Social Needs   Financial resource strain: Not on file   Food insecurity    Worry: Not on file    Inability: Not on file   Transportation needs    Medical: Not on file    Non-medical: Not on file  Tobacco Use   Smoking status: Former Smoker    Years: 35.00    Types: Cigarettes    Quit date: 09/30/1989      Years since quitting: 29.9   Smokeless tobacco: Never Used  Substance and Sexual Activity   Alcohol use: No    Comment: quit drinking about 27 years ago   Drug use: No   Sexual activity: Not on file  Lifestyle   Physical activity    Days per week: Not on file    Minutes per session: Not on file   Stress: Not on file  Relationships   Social connections    Talks on phone: Not on file    Gets together: Not on file    Attends religious service: Not on file    Active member of club or organization: Not on file    Attends meetings of clubs or organizations: Not on file    Relationship status: Not on file  Other Topics Concern   Not on file  Social History Narrative   Not on file     Review of Systems: A 12 point ROS discussed and pertinent positives are indicated in the HPI above.  All other systems are negative.  Review of Systems  Respiratory: Negative for cough and shortness of breath.   Cardiovascular: Positive for chest pain.  Gastrointestinal: Positive for abdominal pain. Negative for diarrhea, nausea and vomiting.  Musculoskeletal: Positive for back pain.  Skin: Positive for wound.  Neurological: Negative for dizziness, light-headedness and headaches.    Vital Signs: BP (!) 88/61    Pulse 87    Temp 97.8 F (36.6 C)    Resp (!) 26    Ht 6' (1.829 m)    Wt 290 lb 2 oz (131.6 kg)    SpO2 97%    BMI 39.35 kg/m   Physical Exam Vitals signs and nursing note reviewed.  Constitutional:      General: He is not in acute distress.    Comments: Alert, able to provide good history, interactive.   HENT:     Head: Normocephalic.     Mouth/Throat:     Mouth: Mucous membranes are moist.     Pharynx: Oropharynx is clear. No oropharyngeal exudate or posterior oropharyngeal erythema.  Cardiovascular:     Rate and Rhythm: Normal rate and regular rhythm.  Pulmonary:     Effort: Pulmonary effort is normal.     Breath sounds: Normal breath sounds.  Abdominal:      General: There is no distension.     Palpations: Abdomen is soft.     Tenderness: There is abdominal tenderness (Minimal over LUQ).  Skin:    General: Skin is warm and dry.     Coloration: Skin is pale.  Neurological:     Mental Status: He is  alert and oriented to person, place, and time.     Motor: Weakness (baseline per patient) present.  Psychiatric:        Mood and Affect: Mood normal.        Behavior: Behavior normal.        Thought Content: Thought content normal.        Judgment: Judgment normal.      MD Evaluation Airway: WNL Heart: WNL Abdomen: WNL Chest/ Lungs: WNL ASA  Classification: 3 Mallampati/Airway Score: Two   Imaging: Ct Head Wo Contrast  Result Date: 09/15/2019 CLINICAL DATA:  Trauma, fall EXAM: CT HEAD WITHOUT CONTRAST CT CERVICAL SPINE WITHOUT CONTRAST TECHNIQUE: Multidetector CT imaging of the head and cervical spine was performed following the standard protocol without intravenous contrast. Multiplanar CT image reconstructions of the cervical spine were also generated. COMPARISON:  None. FINDINGS: CT HEAD FINDINGS Brain: No evidence of acute infarction, hemorrhage, hydrocephalus, extra-axial collection or mass lesion/mass effect. This periventricular and deep white matter hypodensity with global volume loss. Vascular: No hyperdense vessel or unexpected calcification. Skull: Normal. Negative for fracture or focal lesion. Sinuses/Orbits: No acute finding. Other: None. CT CERVICAL SPINE FINDINGS Alignment: Degenerative straightening of the normal cervical lordosis. Skull base and vertebrae: No acute fracture. No primary bone lesion or focal pathologic process. Soft tissues and spinal canal: No prevertebral fluid or swelling. No visible canal hematoma. Disc levels: Severe multilevel disc degenerative disease and partial ankylosis of the cervical spine. Upper chest: Negative. Other: Aortic atherosclerosis. IMPRESSION: 1. No acute intracranial pathology. Small-vessel  white matter disease and global volume loss in keeping with advanced patient age. 2. No fracture or static subluxation of the cervical spine. Severe multilevel disc degenerative disease. 3.  Aortic Atherosclerosis (ICD10-I70.0). Electronically Signed   By: Eddie Candle M.D.   On: 09/15/2019 12:20   Ct Chest W Contrast  Result Date: 09/15/2019 CLINICAL DATA:  Chest trauma, fall, pain in ribs and back, history of colon cancer EXAM: CT CHEST, ABDOMEN, AND PELVIS WITH CONTRAST TECHNIQUE: Multidetector CT imaging of the chest, abdomen and pelvis was performed following the standard protocol during bolus administration of intravenous contrast. CONTRAST:  80mL OMNIPAQUE IOHEXOL 300 MG/ML  SOLN COMPARISON:  None. FINDINGS: CT CHEST FINDINGS Cardiovascular: Aortic atherosclerosis. Cardiomegaly. Three-vessel coronary artery calcifications. No pericardial effusion. Mediastinum/Nodes: No enlarged mediastinal, hilar, or axillary lymph nodes. Thyroid gland, trachea, and esophagus demonstrate no significant findings. Lungs/Pleura: Lungs are clear. Large left-sided diaphragmatic eventration. Trace left pleural effusion. Musculoskeletal: No chest wall mass or suspicious bone lesions identified. There are minimally displaced fractures of the lateral and posterior left seventh through eleventh ribs. CT ABDOMEN PELVIS FINDINGS Hepatobiliary: No solid liver abnormality is seen. Gallstones. No gallbladder wall thickening, or biliary dilatation. Pancreas: Unremarkable. No pancreatic ductal dilatation or surrounding inflammatory changes. Spleen: Normal in size without significant abnormality. Adrenals/Urinary Tract: Adrenal glands are unremarkable. There is a laceration and large hematoma of the inferior pole of the left kidney with multiple internal foci of contrast attenuation concerning for active extravasation (series 3, image 63, 69). There is extensive fluid and contrast in the perirenal fascia and retroperitoneal soft tissues  overlying the psoas. Bladder is unremarkable. Stomach/Bowel: Stomach is within normal limits. No evidence of bowel wall thickening, distention, or inflammatory changes. Evidence of prior sigmoid resection and reanastomosis Vascular/Lymphatic: Aortic atherosclerosis. Infrarenal abdominal aortic aneurysm measuring 4.2 x 4.1 cm. No enlarged abdominal or pelvic lymph nodes. Reproductive: No mass or other abnormality. Other: There is a midline ventral hernia containing nonobstructed  loops of small bowel, hernia sac measuring 11.2 x 3.7 cm and neck measuring 4.8 cm (series 3, image 93, series 7, image 112). No abdominopelvic ascites. Musculoskeletal: No acute or significant osseous findings. IMPRESSION: 1. There is a laceration and large hematoma of the inferior pole of the left kidney with multiple internal foci of contrast attenuation concerning for active extravasation (series 3, image 63, 69). There is extensive fluid and contrast in the perirenal fascia and retroperitoneal soft tissues overlying the psoas. 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 2. There are minimally displaced fractures of the overlying lateral and posterior left seventh through eleventh ribs. 3.  Trace left pleural effusion. 4. 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 5. There is a midline ventral hernia containing nonobstructed loops of small bowel, hernia sac measuring 11.2 x 3.7 cm and neck measuring 4.8 cm (series 3, image 93, series 7, image 112). 6. Infrarenal abdominal aortic aneurysm measuring 4.2 x 4.1 cm. Aortic Atherosclerosis (ICD10-I70.0). 7.  Cardiomegaly and coronary artery disease. 8.  Evidence of prior sigmoid colon resection and reanastomosis. 9.  Cholelithiasis. These results were called by telephone at the time of interpretation on 09/15/2019 at 12:44 pm to provider Veryl Speak ,  who verbally acknowledged these results. Electronically Signed   By: Eddie Candle M.D.   On: 09/15/2019 12:49   Ct Cervical Spine Wo Contrast  Result Date: 09/15/2019 CLINICAL DATA:  Trauma, fall EXAM: CT HEAD WITHOUT CONTRAST CT CERVICAL SPINE WITHOUT CONTRAST TECHNIQUE: Multidetector CT imaging of the head and cervical spine was performed following the standard protocol without intravenous contrast. Multiplanar CT image reconstructions of the cervical spine were also generated. COMPARISON:  None. FINDINGS: CT HEAD FINDINGS Brain: No evidence of acute infarction, hemorrhage, hydrocephalus, extra-axial collection or mass lesion/mass effect. This periventricular and deep white matter hypodensity with global volume loss. Vascular: No hyperdense vessel or unexpected calcification. Skull: Normal. Negative for fracture or focal lesion. Sinuses/Orbits: No acute finding. Other: None. CT CERVICAL SPINE FINDINGS Alignment: Degenerative straightening of the normal cervical lordosis. Skull base and vertebrae: No acute fracture. No primary bone lesion or focal pathologic process. Soft tissues and spinal canal: No prevertebral fluid or swelling. No visible canal hematoma. Disc levels: Severe multilevel disc degenerative disease and partial ankylosis of the cervical spine. Upper chest: Negative. Other: Aortic atherosclerosis. IMPRESSION: 1. No acute intracranial pathology. Small-vessel white matter disease and global volume loss in keeping with advanced patient age. 2. No fracture or static subluxation of the cervical spine. Severe multilevel disc degenerative disease. 3.  Aortic Atherosclerosis (ICD10-I70.0). Electronically Signed   By: Eddie Candle M.D.   On: 09/15/2019 12:20   Ct Abdomen Pelvis W Contrast  Result Date: 09/15/2019 CLINICAL DATA:  Chest trauma, fall, pain in ribs and back, history of colon cancer EXAM: CT CHEST, ABDOMEN, AND PELVIS WITH CONTRAST TECHNIQUE: Multidetector CT imaging of the chest, abdomen  and pelvis was performed following the standard protocol during bolus administration of intravenous contrast. CONTRAST:  48mL OMNIPAQUE IOHEXOL 300 MG/ML  SOLN COMPARISON:  None. FINDINGS: CT CHEST FINDINGS Cardiovascular: Aortic atherosclerosis. Cardiomegaly. Three-vessel coronary artery calcifications. No pericardial effusion. Mediastinum/Nodes: No enlarged mediastinal, hilar, or axillary lymph nodes. Thyroid gland, trachea, and esophagus demonstrate no significant findings. Lungs/Pleura: Lungs are clear. Large left-sided diaphragmatic eventration. Trace left pleural effusion. Musculoskeletal: No chest wall mass or suspicious bone lesions identified. There are  minimally displaced fractures of the lateral and posterior left seventh through eleventh ribs. CT ABDOMEN PELVIS FINDINGS Hepatobiliary: No solid liver abnormality is seen. Gallstones. No gallbladder wall thickening, or biliary dilatation. Pancreas: Unremarkable. No pancreatic ductal dilatation or surrounding inflammatory changes. Spleen: Normal in size without significant abnormality. Adrenals/Urinary Tract: Adrenal glands are unremarkable. There is a laceration and large hematoma of the inferior pole of the left kidney with multiple internal foci of contrast attenuation concerning for active extravasation (series 3, image 63, 69). There is extensive fluid and contrast in the perirenal fascia and retroperitoneal soft tissues overlying the psoas. Bladder is unremarkable. Stomach/Bowel: Stomach is within normal limits. No evidence of bowel wall thickening, distention, or inflammatory changes. Evidence of prior sigmoid resection and reanastomosis Vascular/Lymphatic: Aortic atherosclerosis. Infrarenal abdominal aortic aneurysm measuring 4.2 x 4.1 cm. No enlarged abdominal or pelvic lymph nodes. Reproductive: No mass or other abnormality. Other: There is a midline ventral hernia containing nonobstructed loops of small bowel, hernia sac measuring 11.2 x 3.7 cm  and neck measuring 4.8 cm (series 3, image 93, series 7, image 112). No abdominopelvic ascites. Musculoskeletal: No acute or significant osseous findings. IMPRESSION: 1. There is a laceration and large hematoma of the inferior pole of the left kidney with multiple internal foci of contrast attenuation concerning for active extravasation (series 3, image 63, 69). There is extensive fluid and contrast in the perirenal fascia and retroperitoneal soft tissues overlying the psoas. 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 2. There are minimally displaced fractures of the overlying lateral and posterior left seventh through eleventh ribs. 3.  Trace left pleural effusion. 4. 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 5. There is a midline ventral hernia containing nonobstructed loops of small bowel, hernia sac measuring 11.2 x 3.7 cm and neck measuring 4.8 cm (series 3, image 93, series 7, image 112). 6. Infrarenal abdominal aortic aneurysm measuring 4.2 x 4.1 cm. Aortic Atherosclerosis (ICD10-I70.0). 7.  Cardiomegaly and coronary artery disease. 8.  Evidence of prior sigmoid colon resection and reanastomosis. 9.  Cholelithiasis. These results were called by telephone at the time of interpretation on 09/15/2019 at 12:44 pm to provider Veryl Speak , who verbally acknowledged these results. Electronically Signed   By: Eddie Candle M.D.   On: 09/15/2019 12:49   Dg Chest Port 1 View  Result Date: 09/15/2019 CLINICAL DATA:  Fall with chest pain EXAM: PORTABLE CHEST 1 VIEW COMPARISON:  Chest CT report 12/26/2009 FINDINGS: Low volume chest with generalized interstitial coarsening. There is asymmetric elevation of the left diaphragm not described on prior study. Borderline heart size given portable technique and rightward rotation. Aortic tortuosity. IMPRESSION: 1. Low volume chest  interstitial coarsening, question pulmonary fibrosis. 2. Prominent elevation of the left diaphragm. 3. No definite acute finding. Electronically Signed   By: Monte Fantasia M.D.   On: 09/15/2019 10:06    Labs:  CBC: Recent Labs    09/15/19 0922  WBC 9.8  HGB 14.7  HCT 44.4  PLT 231    COAGS: Recent Labs    09/15/19 0922  INR 2.0*    BMP: Recent Labs    09/15/19 0922  NA 137  K 4.2  CL 101  CO2 23  GLUCOSE 150*  BUN 24*  CALCIUM 9.1  CREATININE 1.46*  GFRNONAA 41*  GFRAA 47*    LIVER FUNCTION TESTS: Recent Labs  09/15/19 0922  BILITOT 1.2  AST 31  ALT 15  ALKPHOS 51  PROT 6.2*  ALBUMIN 3.1*    TUMOR MARKERS: No results for input(s): AFPTM, CEA, CA199, CHROMGRNA in the last 8760 hours.  Assessment and Plan:  85 y/o M with history of a.fib on Xarelto (LD 10/31 PM) who presented to [facility] ED via EMS s/p trip and fall with strike to left chest from laundry basket, down ~2 hours before being discovered by caregiver and being brought to ED. Noted to have left rib fractures 7-11 and a left renal laceration with possible active extravasation - IR has been consulted for renal angiogram with possible embolization which has been approved to be performed emergently today by Dr. Pascal Lux.  Patient significantly hypotensive in ED and upon arrival to IR, BP initially 88/61 mmHg and most recently improved to 116/83 mmHg. Per trauma surgery note patient to receive 2 units PRBCs which has not yet been started per ED RN. Per discussion with Dr. Pascal Lux we will place line for access during procedure and trauma surgery with plans to likely begin pressors.  The Risks and benefits of embolization were discussed with the patient and family friend, [name] [name], including, but not limited to bleeding, infection, vascular injury, post operative pain, or contrast induced renal failure.  This procedure involves the use of X-rays and because of the nature of the planned procedure, it is possible  that we will have prolonged use of X-ray fluoroscopy.  Potential radiation risks to you include (but are not limited to) the following: - A slightly elevated risk for cancer several years later in life. This risk is typically less than 0.5% percent. This risk is low in comparison to the normal incidence of human cancer, which is 33% for women and 50% for men according to the [employer]. - Radiation induced injury can include skin redness, resembling a rash, tissue breakdown / ulcers and hair loss (which can be temporary or permanent).  The likelihood of either of these occurring depends on the difficulty of the procedure and whether you are sensitive to radiation due to previous procedures, disease, or genetic conditions.  IF your procedure requires a prolonged use of radiation, you will be notified and given written instructions for further action. It is your responsibility to monitor the irradiated area for the 2 weeks following the procedure and to notify your physician if you are concerned that you have suffered a radiation induced injury.   All of the patient's questions were answered, patient is agreeable to proceed. Consent signed and in chart.  Thank you for this interesting consult.  I greatly enjoyed meeting Keatin Shorey and look forward to participating in their care.  A copy of this report was sent to the requesting provider on this date.  Electronically Signed: Joaquim Nam, PA-C 09/15/2019, 2:01 PM   I spent a total of 39 Minutesin face to face in clinical consultation, greater than 50% of which was counseling/coordinating care for renal angiogram with possible embolization.

## 2019-09-15 NOTE — Sedation Documentation (Signed)
SPO2 unable to be monitored. Patient in afib and monitor not reading 02 sats because of it

## 2019-09-15 NOTE — ED Notes (Signed)
Mr Sabra Heck  family friend) on phone with patients son to decide on options of treatment. Bobbye Morton, MD at bedside.

## 2019-09-15 NOTE — ED Provider Notes (Signed)
[facility] Provider Note   CSN: [id number] Arrival date & time: 09/15/19  [id number]     History   Chief Complaint Chief Complaint  Patient presents with  . Fall    HPI Ganon Brisbin is a 85 y.o. male.     Patient is a 85 year old male with past medical history of atrial fibrillation on Xarelto, hypertension, and colon cancer.  Patient brought here for evaluation of fall.  Patient states that he tripped over a rug in his home this morning when he fell forward and injured his elbows and left ribs.  He does report striking his head but denies any loss of consciousness or bleeding.  He was found by his caregivers who come to the house twice daily.  Patient states his fall occurred at approximately 7:00 this morning and was on the ground until approximately 830.  He was found by EMS to be hypotensive.  The history is provided by the patient.  Fall This is a new problem. The current episode started 1 to 2 hours ago. The problem occurs constantly. The problem has not changed since onset.Associated symptoms include chest pain. Pertinent negatives include no abdominal pain, no headaches and no shortness of breath. Nothing aggravates the symptoms. Nothing relieves the symptoms. He has tried nothing for the symptoms.    Past Medical History:  Diagnosis Date  . Arthritis   . Atrial fibrillation   . Cancer    colon ca - surgery, no chemo or radiation  . Hypertension   . Right shoulder pain 09/30/12   recently    Patient Active Problem List   Diagnosis Date Noted  . Acute anterior epistaxis 09/30/2012  . Dehydration 09/30/2012  . Hyperkalemia 09/30/2012  . Hyponatremia 09/30/2012  . HTN (hypertension) 09/30/2012  . A-fib ([employer]) 09/30/2012    Past Surgical History:  Procedure Laterality Date  . CATARACT EXTRACTION W/ INTRAOCULAR LENS IMPLANT     b/l  . colon surgery for colon cancer  approx. 2006        Home Medications    Prior to  Admission medications   Medication Sig Start Date End Date Taking? Authorizing Provider  CALCIUM PO Take 1 tablet by mouth daily.    [provider]  cephALEXin (KEFLEX) 500 MG capsule Take 1 capsule (500 mg total) by mouth every 12 (twelve) hours. 10/01/12   Bynum Bellows, MD  ezetimibe-simvastatin (VYTORIN) 10-80 MG per tablet Take 1 tablet by mouth at bedtime.    [provider]  losartan-hydrochlorothiazide (HYZAAR) 100-25 MG per tablet Take 1 tablet by mouth daily. E, calcium, zinc, fish oil    [provider]  metoprolol succinate (TOPROL-XL) 50 MG 24 hr tablet Take 1 tablet (50 mg total) by mouth daily. Take with or immediately following a meal. 10/01/12   Bynum Bellows, MD  Multiple Vitamins-Minerals (ZINC PO) Take 1 tablet by mouth daily.    [provider]  Omega-3 Fatty Acids (FISH OIL PO) Take 1 capsule by mouth daily.    [provider]  Rivaroxaban (XARELTO) 20 MG TABS Take 1 tablet (20 mg total) by mouth daily. Please resume on 10/05/2012 after ENT evaluation by Dr. Constance Holster, please discuss with Dr. Constance Holster prior to taking this medication. 10/01/12   Bynum Bellows, MD  traMADol (ULTRAM) 50 MG tablet Take 50-100 mg by mouth every 6 (six) hours as needed. For pain    [provider]  VITAMIN E PO Take 1 capsule by  mouth daily.    [provider]    Family History Family History  Problem Relation Age of Onset  . Heart attack Father     Social History Social History   Tobacco Use  . Smoking status: Former Smoker    Years: 35.00    Types: Cigarettes    Quit date: 09/30/1989    Years since quitting: 29.9  . Smokeless tobacco: Never Used  Substance Use Topics  . Alcohol use: No    Comment: quit drinking about 27 years ago  . Drug use: No     Allergies   Penicillins   Review of Systems Review of Systems  Respiratory: Negative for shortness of breath.   Cardiovascular: Positive for chest pain.   Gastrointestinal: Negative for abdominal pain.  Neurological: Negative for headaches.  All other systems reviewed and are negative.    Physical Exam Updated Vital Signs BP (!) 82/59 (BP Location: Right Arm)   Temp 97.6 F (36.4 C) (Oral)   Resp 16   Physical Exam Vitals signs and nursing note reviewed.  Constitutional:      General: He is not in acute distress.    Appearance: He is well-developed. He is not diaphoretic.  HENT:     Head: Normocephalic and atraumatic.  Neck:     Musculoskeletal: Normal range of motion and neck supple.  Cardiovascular:     Rate and Rhythm: Normal rate and regular rhythm.     Heart sounds: No murmur. No friction rub.  Pulmonary:     Effort: Pulmonary effort is normal. No respiratory distress.     Breath sounds: Normal breath sounds. No wheezing or rales.     Comments: There is ttp to the left lateral ribs, but no crepitus.  Breath sounds are equal. Abdominal:     General: Bowel sounds are normal. There is no distension.     Palpations: Abdomen is soft.     Tenderness: There is no abdominal tenderness.  Musculoskeletal: Normal range of motion.  Skin:    General: Skin is warm and dry.  Neurological:     Mental Status: He is alert and oriented to person, place, and time.     Coordination: Coordination normal.      ED Treatments / Results  Labs (all labs ordered are listed, but only abnormal results are displayed) Labs Reviewed  CBC WITH DIFFERENTIAL/PLATELET  COMPREHENSIVE METABOLIC PANEL  PROTIME-INR  LACTIC ACID, PLASMA  LACTIC ACID, PLASMA  TROPONIN I (HIGH SENSITIVITY)    EKG EKG Interpretation  Date/Time:  Wednesday September 15 2019 09:52:37 EDT Ventricular Rate:  77 PR Interval:    QRS Duration: 121 QT Interval:  411 QTC Calculation: 466 R Axis:   -55 Text Interpretation:  Atrial fibrillation Nonspecific IVCD with LAD Confirmed by Veryl Speak [phone]) on 09/15/2019 9:55:35 AM   Radiology No results found.   Procedures Procedures (including critical care time)  Medications Ordered in ED Medications  sodium chloride 0.9 % bolus 500 mL (has no administration in time range)     Initial Impression / Assessment and Plan / ED Course  I have reviewed the triage vital signs and the nursing notes.  Pertinent labs & imaging results that were available during my care of the patient were reviewed by me and considered in my medical decision making (see chart for details).  Patient is a 85 year old male brought by EMS after a fall from standing height.  Patient tells me he tripped over a rug this morning and  fell to the ground.  He was found by a caretaker on the floor then brought here.    Patient was somewhat hypotensive during transport and was also hypotensive upon arrival.  His physical examination was essentially unremarkable with the exception of tenderness to the left lateral rib cage.  There was no crepitus and breath sounds were present bilaterally.  Portable chest x-ray obtained revealed no evidence for pneumothorax or obvious thoracic injury.  Patient then hydrated with normal saline and blood pressure initially improved to the [id number] systolic.  Laboratory studies returned essentially unremarkable including white count and hemoglobin.  Due to the fact the patient was taking Xarelto, CT scans were obtained of the head, cervical spine, chest, abdomen, and pelvis.  This revealed multiple left-sided rib fractures as well as a renal laceration that appeared to be actively bleeding based on the results of the CT scan.  Arrangements were made for transfusion.  I then spoke with trauma surgery who has come to evaluate the patient.  They are making arrangements for the patient to go to interventional radiology for possible embolization of the renal laceration.  CRITICAL CARE Performed by: Veryl Speak Total critical care time: 45 minutes Critical care time was exclusive of separately billable procedures  and treating other patients. Critical care was necessary to treat or prevent imminent or life-threatening deterioration. Critical care was time spent personally by me on the following activities: development of treatment plan with patient and/or surrogate as well as nursing, discussions with consultants, evaluation of patient's response to treatment, examination of patient, obtaining history from patient or surrogate, ordering and performing treatments and interventions, ordering and review of laboratory studies, ordering and review of radiographic studies, pulse oximetry and re-evaluation of patient's condition.   Final Clinical Impressions(s) / ED Diagnoses   Final diagnoses:  None    ED Discharge Orders    None       Veryl Speak, MD 09/15/19 [id number]

## 2019-09-15 NOTE — ED Notes (Signed)
Pt transported to IR 

## 2019-09-15 NOTE — ED Notes (Signed)
[place] [phone] Dr. Stark Jock paged by Levada Dy

## 2019-09-15 NOTE — Sedation Documentation (Signed)
SPO2 still unable to be monitored due to low perfusion possibly from patient being in afib.

## 2019-09-16 ENCOUNTER — Inpatient Hospital Stay (HOSPITAL_COMMUNITY): Payer: Medicare HMO

## 2019-09-16 LAB — TYPE AND SCREEN
ABO/RH(D): O POS
Antibody Screen: NEGATIVE
Unit division: 0
Unit division: 0

## 2019-09-16 LAB — CBC
HCT: 38.6 % — ABNORMAL LOW (ref 39.0–52.0)
HCT: 38.6 % — ABNORMAL LOW (ref 39.0–52.0)
HCT: 35.5 % — ABNORMAL LOW (ref 39.0–52.0)
HCT: 35.3 % — ABNORMAL LOW (ref 39.0–52.0)
Hemoglobin: 13.4 g/dL (ref 13.0–17.0)
Hemoglobin: 12.8 g/dL — ABNORMAL LOW (ref 13.0–17.0)
Hemoglobin: 11.9 g/dL — ABNORMAL LOW (ref 13.0–17.0)
Hemoglobin: 11.7 g/dL — ABNORMAL LOW (ref 13.0–17.0)
MCH: 32.5 pg (ref 26.0–34.0)
MCH: 31.5 pg (ref 26.0–34.0)
MCH: 31.6 pg (ref 26.0–34.0)
MCH: 31.6 pg (ref 26.0–34.0)
MCHC: 34.7 g/dL (ref 30.0–36.0)
MCHC: 33.2 g/dL (ref 30.0–36.0)
MCHC: 33.5 g/dL (ref 30.0–36.0)
MCHC: 33.1 g/dL (ref 30.0–36.0)
MCV: 93.7 fL (ref 80.0–100.0)
MCV: 95.1 fL (ref 80.0–100.0)
MCV: 94.4 fL (ref 80.0–100.0)
MCV: 95.4 fL (ref 80.0–100.0)
Platelets: 189 10*3/uL (ref 150–400)
Platelets: 200 10*3/uL (ref 150–400)
Platelets: 181 10*3/uL (ref 150–400)
Platelets: 177 10*3/uL (ref 150–400)
RBC: 4.12 MIL/uL — ABNORMAL LOW (ref 4.22–5.81)
RBC: 4.06 MIL/uL — ABNORMAL LOW (ref 4.22–5.81)
RBC: 3.76 MIL/uL — ABNORMAL LOW (ref 4.22–5.81)
RBC: 3.7 MIL/uL — ABNORMAL LOW (ref 4.22–5.81)
RDW: 15.2 % (ref 11.5–15.5)
RDW: 15.1 % (ref 11.5–15.5)
RDW: 15.2 % (ref 11.5–15.5)
RDW: 15.2 % (ref 11.5–15.5)
WBC: 14.7 10*3/uL — ABNORMAL HIGH (ref 4.0–10.5)
WBC: 17.2 10*3/uL — ABNORMAL HIGH (ref 4.0–10.5)
WBC: 17.5 10*3/uL — ABNORMAL HIGH (ref 4.0–10.5)
WBC: 17.5 10*3/uL — ABNORMAL HIGH (ref 4.0–10.5)
nRBC: 0 % (ref 0.0–0.2)
nRBC: 0 % (ref 0.0–0.2)
nRBC: 0 % (ref 0.0–0.2)
nRBC: 0 % (ref 0.0–0.2)

## 2019-09-16 LAB — BASIC METABOLIC PANEL
Anion gap: 12 (ref 5–15)
BUN: 29 mg/dL — ABNORMAL HIGH (ref 8–23)
CO2: 21 mmol/L — ABNORMAL LOW (ref 22–32)
Calcium: 8.5 mg/dL — ABNORMAL LOW (ref 8.9–10.3)
Chloride: 103 mmol/L (ref 98–111)
Creatinine, Ser: 1.86 mg/dL — ABNORMAL HIGH (ref 0.61–1.24)
GFR calc Af Amer: 35 mL/min — ABNORMAL LOW (ref >60)
GFR calc non Af Amer: 30 mL/min — ABNORMAL LOW (ref >60)
Glucose, Bld: 155 mg/dL — ABNORMAL HIGH (ref 70–99)
Potassium: 3.6 mmol/L (ref 3.5–5.1)
Sodium: 136 mmol/L (ref 135–145)

## 2019-09-16 LAB — BPAM RBC
Blood Product Expiration Date: 202011172359
Blood Product Expiration Date: 202011172359
ISSUE DATE / TIME: 202010141318
ISSUE DATE / TIME: 202010141318
Unit Type and Rh: 5100
Unit Type and Rh: 5100

## 2019-09-16 LAB — GLUCOSE, CAPILLARY: Glucose-Capillary: 122 mg/dL — ABNORMAL HIGH (ref 70–99)

## 2019-09-16 LAB — MRSA PCR SCREENING: MRSA by PCR: POSITIVE — AB

## 2019-09-16 MED ORDER — ATORVASTATIN CALCIUM 80 MG PO TABS
80.0000 mg | ORAL_TABLET | Freq: Every day | ORAL | Status: DC
  Administered 2019-09-17 – 2019-09-27 (×11): 80 mg via ORAL
  Filled 2019-09-16 (×13): qty 1

## 2019-09-16 MED ORDER — B COMPLEX-C PO TABS
1.0000 | ORAL_TABLET | Freq: Every day | ORAL | Status: DC
  Administered 2019-09-16 – 2019-09-28 (×13): 1 via ORAL
  Filled 2019-09-16 (×13): qty 1

## 2019-09-16 MED ORDER — LOSARTAN POTASSIUM 50 MG PO TABS
100.0000 mg | ORAL_TABLET | Freq: Every day | ORAL | Status: DC
  Administered 2019-09-16: 100 mg via ORAL
  Filled 2019-09-16: qty 2

## 2019-09-16 MED ORDER — METOPROLOL SUCCINATE ER 25 MG PO TB24
25.0000 mg | ORAL_TABLET | Freq: Every day | ORAL | Status: DC
  Administered 2019-09-16 – 2019-09-28 (×13): 25 mg via ORAL
  Filled 2019-09-16 (×13): qty 1

## 2019-09-16 MED ORDER — CHLORHEXIDINE GLUCONATE CLOTH 2 % EX PADS
6.0000 | MEDICATED_PAD | Freq: Every day | CUTANEOUS | Status: DC
  Administered 2019-09-16 – 2019-09-28 (×10): 6 via TOPICAL

## 2019-09-16 MED ORDER — METHOCARBAMOL 500 MG PO TABS: 500.0000 mg | ORAL_TABLET | Freq: Three times a day (TID) | ORAL | Status: DC

## 2019-09-16 MED ORDER — METHOCARBAMOL 500 MG PO TABS
500.0000 mg | ORAL_TABLET | Freq: Three times a day (TID) | ORAL | Status: DC | PRN
  Administered 2019-09-16: 500 mg via ORAL
  Filled 2019-09-16: qty 1

## 2019-09-16 MED ORDER — MUPIROCIN 2 % EX OINT
1.0000 "application " | TOPICAL_OINTMENT | Freq: Two times a day (BID) | CUTANEOUS | Status: AC
  Administered 2019-09-16 – 2019-09-20 (×10): 1 via NASAL
  Filled 2019-09-16 (×4): qty 22

## 2019-09-16 MED ORDER — HYDROCHLOROTHIAZIDE 25 MG PO TABS
25.0000 mg | ORAL_TABLET | Freq: Every day | ORAL | Status: DC
  Filled 2019-09-16: qty 1

## 2019-09-16 MED ORDER — LOSARTAN POTASSIUM-HCTZ 100-25 MG PO TABS: 1.0000 | ORAL_TABLET | Freq: Every day | ORAL | Status: DC

## 2019-09-16 NOTE — Progress Notes (Signed)
[facility] Progress Note     Subjective: CC: pain in left side Patient with some pain in left ribs and flank. Denies nausea. Wanting to get on the bedpan to have a BM. Denies SOB, no IS in room.   Objective: Vital signs in last 24 hours: Temp:  [96 F (35.6 C)-98.6 F (37 C)] 97.8 F (36.6 C) (10/15 0800) Pulse Rate:  [54-124] 124 (10/15 0800) Resp:  [10-32] 28 (10/15 0800) BP: (73-139)/(46-107) 137/88 (10/15 0700) SpO2:  [85 %-100 %] 93 % (10/15 0800) Weight:  [112.3 kg-131.6 kg] 112.3 kg (10/15 0315)    Intake/Output from previous day: 10/14 0701 - 10/15 0700 In: 1182.5 [I.V.:867.5; Blood:315] Out: -  Intake/Output this shift: No intake/output data recorded.  PE: Gen:  Alert, NAD, pleasant Card:  Sinus tachycardia, pedal pulses 2+ BL Pulm:  Normal effort, clear to auscultation bilaterally Abd: Soft, non-tender, non-distended, +BS Skin: warm and dry, no rashes  Neuro: A&Ox4, speech clear  Lab Results:  Recent Labs    09/16/19 0246 09/16/19 0751  WBC 14.7* 17.2*  HGB 13.4 12.8*  HCT 38.6* 38.6*  PLT 189 200   BMET Recent Labs    09/15/19 0922 09/16/19 0341  NA 137 136  K 4.2 3.6  CL 101 103  CO2 23 21*  GLUCOSE 150* 155*  BUN 24* 29*  CREATININE 1.46* 1.86*  CALCIUM 9.1 8.5*   PT/INR Recent Labs    09/15/19 0922  LABPROT 22.8*  INR 2.0*   CMP     Component Value Date/Time   NA 136 09/16/2019 0341   K 3.6 09/16/2019 0341   CL 103 09/16/2019 0341   CO2 21 (L) 09/16/2019 0341   GLUCOSE 155 (H) 09/16/2019 0341   BUN 29 (H) 09/16/2019 0341   CREATININE 1.86 (H) 09/16/2019 0341   CALCIUM 8.5 (L) 09/16/2019 0341   PROT 6.2 (L) 09/15/2019 0922   ALBUMIN 3.1 (L) 09/15/2019 0922   AST 31 09/15/2019 0922   ALT 15 09/15/2019 0922   ALKPHOS 51 09/15/2019 0922   BILITOT 1.2 09/15/2019 0922   GFRNONAA 30 (L) 09/16/2019 0341   GFRAA 35 (L) 09/16/2019 0341   Lipase  No results found for: LIPASE     Studies/Results: Ct Head Wo  Contrast  Result Date: 09/15/2019 CLINICAL DATA:  Trauma, fall EXAM: CT HEAD WITHOUT CONTRAST CT CERVICAL SPINE WITHOUT CONTRAST TECHNIQUE: Multidetector CT imaging of the head and cervical spine was performed following the standard protocol without intravenous contrast. Multiplanar CT image reconstructions of the cervical spine were also generated. COMPARISON:  None. FINDINGS: CT HEAD FINDINGS Brain: No evidence of acute infarction, hemorrhage, hydrocephalus, extra-axial collection or mass lesion/mass effect. This periventricular and deep white matter hypodensity with global volume loss. Vascular: No hyperdense vessel or unexpected calcification. Skull: Normal. Negative for fracture or focal lesion. Sinuses/Orbits: No acute finding. Other: None. CT CERVICAL SPINE FINDINGS Alignment: Degenerative straightening of the normal cervical lordosis. Skull base and vertebrae: No acute fracture. No primary bone lesion or focal pathologic process. Soft tissues and spinal canal: No prevertebral fluid or swelling. No visible canal hematoma. Disc levels: Severe multilevel disc degenerative disease and partial ankylosis of the cervical spine. Upper chest: Negative. Other: Aortic atherosclerosis. IMPRESSION: 1. No acute intracranial pathology. Small-vessel white matter disease and global volume loss in keeping with advanced patient age. 2. No fracture or static subluxation of the cervical spine. Severe multilevel disc degenerative disease. 3.  Aortic Atherosclerosis (ICD10-I70.0). Electronically Signed   By: Cristie Hem  Laqueta Carina M.D.   On: 09/15/2019 12:20   Ct Chest W Contrast  Result Date: 09/15/2019 CLINICAL DATA:  Chest trauma, fall, pain in ribs and back, history of colon cancer EXAM: CT CHEST, ABDOMEN, AND PELVIS WITH CONTRAST TECHNIQUE: Multidetector CT imaging of the chest, abdomen and pelvis was performed following the standard protocol during bolus administration of intravenous contrast. CONTRAST:  55m OMNIPAQUE IOHEXOL  300 MG/ML  SOLN COMPARISON:  None. FINDINGS: CT CHEST FINDINGS Cardiovascular: Aortic atherosclerosis. Cardiomegaly. Three-vessel coronary artery calcifications. No pericardial effusion. Mediastinum/Nodes: No enlarged mediastinal, hilar, or axillary lymph nodes. Thyroid gland, trachea, and esophagus demonstrate no significant findings. Lungs/Pleura: Lungs are clear. Large left-sided diaphragmatic eventration. Trace left pleural effusion. Musculoskeletal: No chest wall mass or suspicious bone lesions identified. There are minimally displaced fractures of the lateral and posterior left seventh through eleventh ribs. CT ABDOMEN PELVIS FINDINGS Hepatobiliary: No solid liver abnormality is seen. Gallstones. No gallbladder wall thickening, or biliary dilatation. Pancreas: Unremarkable. No pancreatic ductal dilatation or surrounding inflammatory changes. Spleen: Normal in size without significant abnormality. Adrenals/Urinary Tract: Adrenal glands are unremarkable. There is a laceration and large hematoma of the inferior pole of the left kidney with multiple internal foci of contrast attenuation concerning for active extravasation (series 3, image 63, 69). There is extensive fluid and contrast in the perirenal fascia and retroperitoneal soft tissues overlying the psoas. Bladder is unremarkable. Stomach/Bowel: Stomach is within normal limits. No evidence of bowel wall thickening, distention, or inflammatory changes. Evidence of prior sigmoid resection and reanastomosis Vascular/Lymphatic: Aortic atherosclerosis. Infrarenal abdominal aortic aneurysm measuring 4.2 x 4.1 cm. No enlarged abdominal or pelvic lymph nodes. Reproductive: No mass or other abnormality. Other: There is a midline ventral hernia containing nonobstructed loops of small bowel, hernia sac measuring 11.2 x 3.7 cm and neck measuring 4.8 cm (series 3, image 93, series 7, image 112). No abdominopelvic ascites. Musculoskeletal: No acute or significant osseous  findings. IMPRESSION: 1. There is a laceration and large hematoma of the inferior pole of the left kidney with multiple internal foci of contrast attenuation concerning for active extravasation (series 3, image 63, 69). There is extensive fluid and contrast in the perirenal fascia and retroperitoneal soft tissues overlying the psoas. 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 2. There are minimally displaced fractures of the overlying lateral and posterior left seventh through eleventh ribs. 3.  Trace left pleural effusion. 4. 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 5. There is a midline ventral hernia containing nonobstructed loops of small bowel, hernia sac measuring 11.2 x 3.7 cm and neck measuring 4.8 cm (series 3, image 93, series 7, image 112). 6. Infrarenal abdominal aortic aneurysm measuring 4.2 x 4.1 cm. Aortic Atherosclerosis (ICD10-I70.0). 7.  Cardiomegaly and coronary artery disease. 8.  Evidence of prior sigmoid colon resection and reanastomosis. 9.  Cholelithiasis. These results were called by telephone at the time of interpretation on 09/15/2019 at 12:44 pm to provider DVeryl Speak, who verbally acknowledged these results. Electronically Signed   By: AEddie CandleM.D.   On: 09/15/2019 12:49   Ct Cervical Spine Wo Contrast  Result Date: 09/15/2019 CLINICAL DATA:  Trauma, fall EXAM: CT HEAD WITHOUT CONTRAST CT CERVICAL SPINE WITHOUT CONTRAST TECHNIQUE: Multidetector CT imaging of the head and cervical spine was performed following the standard protocol without intravenous contrast. Multiplanar CT image reconstructions of the cervical spine were also  generated. COMPARISON:  None. FINDINGS: CT HEAD FINDINGS Brain: No evidence of acute infarction, hemorrhage, hydrocephalus, extra-axial collection or mass lesion/mass effect. This periventricular and deep  white matter hypodensity with global volume loss. Vascular: No hyperdense vessel or unexpected calcification. Skull: Normal. Negative for fracture or focal lesion. Sinuses/Orbits: No acute finding. Other: None. CT CERVICAL SPINE FINDINGS Alignment: Degenerative straightening of the normal cervical lordosis. Skull base and vertebrae: No acute fracture. No primary bone lesion or focal pathologic process. Soft tissues and spinal canal: No prevertebral fluid or swelling. No visible canal hematoma. Disc levels: Severe multilevel disc degenerative disease and partial ankylosis of the cervical spine. Upper chest: Negative. Other: Aortic atherosclerosis. IMPRESSION: 1. No acute intracranial pathology. Small-vessel white matter disease and global volume loss in keeping with advanced patient age. 2. No fracture or static subluxation of the cervical spine. Severe multilevel disc degenerative disease. 3.  Aortic Atherosclerosis (ICD10-I70.0). Electronically Signed   By: Eddie Candle M.D.   On: 09/15/2019 12:20   Ct Abdomen Pelvis W Contrast  Result Date: 09/15/2019 CLINICAL DATA:  Chest trauma, fall, pain in ribs and back, history of colon cancer EXAM: CT CHEST, ABDOMEN, AND PELVIS WITH CONTRAST TECHNIQUE: Multidetector CT imaging of the chest, abdomen and pelvis was performed following the standard protocol during bolus administration of intravenous contrast. CONTRAST:  22m OMNIPAQUE IOHEXOL 300 MG/ML  SOLN COMPARISON:  None. FINDINGS: CT CHEST FINDINGS Cardiovascular: Aortic atherosclerosis. Cardiomegaly. Three-vessel coronary artery calcifications. No pericardial effusion. Mediastinum/Nodes: No enlarged mediastinal, hilar, or axillary lymph nodes. Thyroid gland, trachea, and esophagus demonstrate no significant findings. Lungs/Pleura: Lungs are clear. Large left-sided diaphragmatic eventration. Trace left pleural effusion. Musculoskeletal: No chest wall mass or suspicious bone lesions identified. There are minimally  displaced fractures of the lateral and posterior left seventh through eleventh ribs. CT ABDOMEN PELVIS FINDINGS Hepatobiliary: No solid liver abnormality is seen. Gallstones. No gallbladder wall thickening, or biliary dilatation. Pancreas: Unremarkable. No pancreatic ductal dilatation or surrounding inflammatory changes. Spleen: Normal in size without significant abnormality. Adrenals/Urinary Tract: Adrenal glands are unremarkable. There is a laceration and large hematoma of the inferior pole of the left kidney with multiple internal foci of contrast attenuation concerning for active extravasation (series 3, image 63, 69). There is extensive fluid and contrast in the perirenal fascia and retroperitoneal soft tissues overlying the psoas. Bladder is unremarkable. Stomach/Bowel: Stomach is within normal limits. No evidence of bowel wall thickening, distention, or inflammatory changes. Evidence of prior sigmoid resection and reanastomosis Vascular/Lymphatic: Aortic atherosclerosis. Infrarenal abdominal aortic aneurysm measuring 4.2 x 4.1 cm. No enlarged abdominal or pelvic lymph nodes. Reproductive: No mass or other abnormality. Other: There is a midline ventral hernia containing nonobstructed loops of small bowel, hernia sac measuring 11.2 x 3.7 cm and neck measuring 4.8 cm (series 3, image 93, series 7, image 112). No abdominopelvic ascites. Musculoskeletal: No acute or significant osseous findings. IMPRESSION: 1. There is a laceration and large hematoma of the inferior pole of the left kidney with multiple internal foci of contrast attenuation concerning for active extravasation (series 3, image 63, 69). There is extensive fluid and contrast in the perirenal fascia and retroperitoneal soft tissues overlying the psoas. 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 2. There are minimally displaced fractures of the overlying lateral and posterior left seventh through eleventh ribs. 3.  Trace left  pleural effusion. 4. Large eventration of the left hemidiaphragm, likely chronic and incidental, traumatic elevation of the diaphragm for example due to phrenic nerve  injury less favored. 5. There is a midline ventral hernia containing nonobstructed loops of small bowel, hernia sac measuring 11.2 x 3.7 cm and neck measuring 4.8 cm (series 3, image 93, series 7, image 112). 6. Infrarenal abdominal aortic aneurysm measuring 4.2 x 4.1 cm. Aortic Atherosclerosis (ICD10-I70.0). 7.  Cardiomegaly and coronary artery disease. 8.  Evidence of prior sigmoid colon resection and reanastomosis. 9.  Cholelithiasis. These results were called by telephone at the time of interpretation on 09/15/2019 at 12:44 pm to provider Veryl Speak , who verbally acknowledged these results. Electronically Signed   By: Eddie Candle M.D.   On: 09/15/2019 12:49   Ir Angiogram Renal Uni Selective  Result Date: 09/15/2019 INDICATION: Fall on anticoagulation, now with left-sided renal fractures and left renal laceration. Request made for left renal arteriogram and potential embolization. Request also made for placement of a image guided central venous catheter for durable intravenous access. EXAM: 1. ULTRASOUND GUIDANCE FOR VENOUS ACCESS 2. FLUOROSCOPIC GUIDED PLACEMENT OF A NON TUNNELED TRIPLE-LUMEN CENTRAL VENOUS CATHETER. 3. ULTRASOUND GUIDANCE FOR ARTERIAL ACCESS 4. FLUSH ABDOMINAL AORTOGRAM 5. SELECTIVE LEFT RENAL ARTERIOGRAM 6. SUB SELECTIVE LEFT ARTERIOGRAM AND PERCUTANEOUS COIL EMBOLIZATION COMPARISON:  CT the chest, abdomen and pelvis - earlier same day MEDICATIONS: NONE ANESTHESIA/SEDATION: Moderate (conscious) sedation was employed during this procedure. A total of Versed 2 mg and Fentanyl 100 mcg was administered intravenously. Moderate Sedation Time: 85 minutes. The patient's level of consciousness and vital signs were monitored continuously by radiology nursing throughout the procedure under my direct supervision. CONTRAST:  75 cc  Omnipaque 300 FLUOROSCOPY TIME:  24 minutes (3,953 mGy) COMPLICATIONS: None immediate. PROCEDURE: Informed consent was obtained from the patient following explanation of the procedure, risks, benefits and alternatives. The patient understands, agrees and consents for the procedure. All questions were addressed. A time out was performed prior to the initiation of the procedure. Maximal barrier sterile technique utilized including caps, mask, sterile gowns, sterile gloves, large sterile drape, hand hygiene, and Betadine prep. Attention was initially paid towards placement of a non tunneled central venous catheter. Under direct ultrasound guidance, the left internal jugular vein was accessed with a micropuncture kit. A micropuncture wire was utilized for measurement purposes. Under fluoroscopic guidance, a peel-away sheath was placed and a 15 cm triple-lumen non tunneled central venous catheter was inserted with tip terminating at the level the superior cavoatrial junction. Postprocedural spot fluoroscopic radiographic image was obtained. The central venous catheter was secured at the neck entrance site within interrupted suture. A dressing was applied. _________________________________________________________ The right femoral head was marked fluoroscopically. Under ultrasound guidance, the right common femoral artery was accessed with a micropuncture kit after the overlying soft tissues were anesthetized with 1% lidocaine. An ultrasound image was saved for documentation purposes. The micropuncture sheath was exchanged for a 5 [place] vascular sheath over a Bentson wire. A closure arteriogram was performed through the side of the sheath confirming access within the right common femoral artery. Over a Bentson wire, a Mickelson catheter was advanced to the level of the inferior aspect of the thoracic aorta where it was back bled and flushed. The catheter was then utilized to select the left renal artery and a selective  left renal arteriogram was performed. Next, with the use of a fathom 14 microcatheter, a regular Renegade microcatheter was advanced into a midpole division of the left renal artery. Sub selective arteriogram was performed and the vessel was percutaneously coil embolized with multiple overlapping 2 mm, 3 mm and 4 mm  diameter interlock coils. Next, the microcatheter was utilized to an additional midpole division of the left renal artery and a sub selective arteriogram was performed. Next, the vessel was percutaneously coil embolized with multiple overlapping 4 mm, 5 mm and 6 mm interlock coils. The microcatheter was retracted to the level of the main left renal artery and selective left arteriogram was performed. The microcatheter was removed and a completion arteriogram was performed via the [facility] catheter. Next, prolonged efforts were made to cannulate the known tiny accessory left renal artery however this ultimately proved difficult. As such, a flush aortogram was performed demonstrating patency of the accessory left renal artery. Unfortunately, despite prolonged efforts, the tiny accessory left renal artery could not be selectively cannulated, likely secondary to a combination of suspected narrowing involving the vessel's origin (as was demonstrated on preceding CT) as well as the patient's aneurysmal and tortuous abdominal aorta. At this point, the procedure was terminated. All wires, catheters and sheaths were removed patient. Hemostasis was achieved at the right groin access site with deployment of an ExoSeal closure device and manual compression. A dressing applied. The patient tolerated the procedure well without immediate postprocedural complication. FINDINGS: Selective left renal arteriogram demonstrates marked irregularity and contrast extravasation arising from several midpole subsegmental renal arteries. As such, both co-dominant midpole segmental arteries were sub selective and percutaneously coil  embolized. Completion arteriogram demonstrates a technically excellent result without evidence of residual contrast extravasation or vessel irregularity. Completion left renal arteriogram demonstrates patency of several superior pole segmental divisions without additional area contrast extravasation or vessel irregularity. Flush abdominal aortogram demonstrates patency of the known accessory tiny left renal artery. Despite prolonged efforts, the accessory left renal artery could not be selectively cannulated, likely secondary to a combination of suspected narrowing of the vessel's origin (as was demonstrated on preceding CT), as well as the patient's aneurysmal and tortuous abdominal aorta. IMPRESSION: 1. Technically successful percutaneous coil embolization of two mid pole segmental arteries supplying ill-defined areas of contrast extravasation and vessel irregularity. 2. Attempted, though unsuccessful, cannulation of the known tiny left accessory renal artery likely to secondary to a combination of suspected narrowing of the vessel's origin (as was demonstrated on preceding CT) as well as the patient's aneurysmal and tortuous abdominal aorta. 3. Successful image guided placement of a non tunneled right triple-lumen central venous catheter with tip terminating within the superior cavoatrial junction. The central venous catheter is ready for immediate use. Electronically Signed   By: Sandi Mariscal M.D.   On: 09/15/2019 16:52   Ir Fluoro Guide Cv Line Right  Result Date: 09/15/2019 INDICATION: Fall on anticoagulation, now with left-sided renal fractures and left renal laceration. Request made for left renal arteriogram and potential embolization. Request also made for placement of a image guided central venous catheter for durable intravenous access. EXAM: 1. ULTRASOUND GUIDANCE FOR VENOUS ACCESS 2. FLUOROSCOPIC GUIDED PLACEMENT OF A NON TUNNELED TRIPLE-LUMEN CENTRAL VENOUS CATHETER. 3. ULTRASOUND GUIDANCE FOR  ARTERIAL ACCESS 4. FLUSH ABDOMINAL AORTOGRAM 5. SELECTIVE LEFT RENAL ARTERIOGRAM 6. SUB SELECTIVE LEFT ARTERIOGRAM AND PERCUTANEOUS COIL EMBOLIZATION COMPARISON:  CT the chest, abdomen and pelvis - earlier same day MEDICATIONS: NONE ANESTHESIA/SEDATION: Moderate (conscious) sedation was employed during this procedure. A total of Versed 2 mg and Fentanyl 100 mcg was administered intravenously. Moderate Sedation Time: 85 minutes. The patient's level of consciousness and vital signs were monitored continuously by radiology nursing throughout the procedure under my direct supervision. CONTRAST:  75 cc Omnipaque 300 FLUOROSCOPY TIME:  24 minutes (  5,284 mGy) COMPLICATIONS: None immediate. PROCEDURE: Informed consent was obtained from the patient following explanation of the procedure, risks, benefits and alternatives. The patient understands, agrees and consents for the procedure. All questions were addressed. A time out was performed prior to the initiation of the procedure. Maximal barrier sterile technique utilized including caps, mask, sterile gowns, sterile gloves, large sterile drape, hand hygiene, and Betadine prep. Attention was initially paid towards placement of a non tunneled central venous catheter. Under direct ultrasound guidance, the left internal jugular vein was accessed with a micropuncture kit. A micropuncture wire was utilized for measurement purposes. Under fluoroscopic guidance, a peel-away sheath was placed and a 15 cm triple-lumen non tunneled central venous catheter was inserted with tip terminating at the level the superior cavoatrial junction. Postprocedural spot fluoroscopic radiographic image was obtained. The central venous catheter was secured at the neck entrance site within interrupted suture. A dressing was applied. _________________________________________________________ The right femoral head was marked fluoroscopically. Under ultrasound guidance, the right common femoral artery was  accessed with a micropuncture kit after the overlying soft tissues were anesthetized with 1% lidocaine. An ultrasound image was saved for documentation purposes. The micropuncture sheath was exchanged for a 5 [place] vascular sheath over a Bentson wire. A closure arteriogram was performed through the side of the sheath confirming access within the right common femoral artery. Over a Bentson wire, a Mickelson catheter was advanced to the level of the inferior aspect of the thoracic aorta where it was back bled and flushed. The catheter was then utilized to select the left renal artery and a selective left renal arteriogram was performed. Next, with the use of a fathom 14 microcatheter, a regular Renegade microcatheter was advanced into a midpole division of the left renal artery. Sub selective arteriogram was performed and the vessel was percutaneously coil embolized with multiple overlapping 2 mm, 3 mm and 4 mm diameter interlock coils. Next, the microcatheter was utilized to an additional midpole division of the left renal artery and a sub selective arteriogram was performed. Next, the vessel was percutaneously coil embolized with multiple overlapping 4 mm, 5 mm and 6 mm interlock coils. The microcatheter was retracted to the level of the main left renal artery and selective left arteriogram was performed. The microcatheter was removed and a completion arteriogram was performed via the [facility] catheter. Next, prolonged efforts were made to cannulate the known tiny accessory left renal artery however this ultimately proved difficult. As such, a flush aortogram was performed demonstrating patency of the accessory left renal artery. Unfortunately, despite prolonged efforts, the tiny accessory left renal artery could not be selectively cannulated, likely secondary to a combination of suspected narrowing involving the vessel's origin (as was demonstrated on preceding CT) as well as the patient's aneurysmal and tortuous  abdominal aorta. At this point, the procedure was terminated. All wires, catheters and sheaths were removed patient. Hemostasis was achieved at the right groin access site with deployment of an ExoSeal closure device and manual compression. A dressing applied. The patient tolerated the procedure well without immediate postprocedural complication. FINDINGS: Selective left renal arteriogram demonstrates marked irregularity and contrast extravasation arising from several midpole subsegmental renal arteries. As such, both co-dominant midpole segmental arteries were sub selective and percutaneously coil embolized. Completion arteriogram demonstrates a technically excellent result without evidence of residual contrast extravasation or vessel irregularity. Completion left renal arteriogram demonstrates patency of several superior pole segmental divisions without additional area contrast extravasation or vessel irregularity. Flush abdominal aortogram demonstrates patency  of the known accessory tiny left renal artery. Despite prolonged efforts, the accessory left renal artery could not be selectively cannulated, likely secondary to a combination of suspected narrowing of the vessel's origin (as was demonstrated on preceding CT), as well as the patient's aneurysmal and tortuous abdominal aorta. IMPRESSION: 1. Technically successful percutaneous coil embolization of two mid pole segmental arteries supplying ill-defined areas of contrast extravasation and vessel irregularity. 2. Attempted, though unsuccessful, cannulation of the known tiny left accessory renal artery likely to secondary to a combination of suspected narrowing of the vessel's origin (as was demonstrated on preceding CT) as well as the patient's aneurysmal and tortuous abdominal aorta. 3. Successful image guided placement of a non tunneled right triple-lumen central venous catheter with tip terminating within the superior cavoatrial junction. The central venous  catheter is ready for immediate use. Electronically Signed   By: Sandi Mariscal M.D.   On: 09/15/2019 16:52   Ir US Guide Vasc Access Right  Result Date: 09/15/2019 INDICATION: Fall on anticoagulation, now with left-sided renal fractures and left renal laceration. Request made for left renal arteriogram and potential embolization. Request also made for placement of a image guided central venous catheter for durable intravenous access. EXAM: 1. ULTRASOUND GUIDANCE FOR VENOUS ACCESS 2. FLUOROSCOPIC GUIDED PLACEMENT OF A NON TUNNELED TRIPLE-LUMEN CENTRAL VENOUS CATHETER. 3. ULTRASOUND GUIDANCE FOR ARTERIAL ACCESS 4. FLUSH ABDOMINAL AORTOGRAM 5. SELECTIVE LEFT RENAL ARTERIOGRAM 6. SUB SELECTIVE LEFT ARTERIOGRAM AND PERCUTANEOUS COIL EMBOLIZATION COMPARISON:  CT the chest, abdomen and pelvis - earlier same day MEDICATIONS: NONE ANESTHESIA/SEDATION: Moderate (conscious) sedation was employed during this procedure. A total of Versed 2 mg and Fentanyl 100 mcg was administered intravenously. Moderate Sedation Time: 85 minutes. The patient's level of consciousness and vital signs were monitored continuously by radiology nursing throughout the procedure under my direct supervision. CONTRAST:  75 cc Omnipaque 300 FLUOROSCOPY TIME:  24 minutes (2,025 mGy) COMPLICATIONS: None immediate. PROCEDURE: Informed consent was obtained from the patient following explanation of the procedure, risks, benefits and alternatives. The patient understands, agrees and consents for the procedure. All questions were addressed. A time out was performed prior to the initiation of the procedure. Maximal barrier sterile technique utilized including caps, mask, sterile gowns, sterile gloves, large sterile drape, hand hygiene, and Betadine prep. Attention was initially paid towards placement of a non tunneled central venous catheter. Under direct ultrasound guidance, the left internal jugular vein was accessed with a micropuncture kit. A micropuncture  wire was utilized for measurement purposes. Under fluoroscopic guidance, a peel-away sheath was placed and a 15 cm triple-lumen non tunneled central venous catheter was inserted with tip terminating at the level the superior cavoatrial junction. Postprocedural spot fluoroscopic radiographic image was obtained. The central venous catheter was secured at the neck entrance site within interrupted suture. A dressing was applied. _________________________________________________________ The right femoral head was marked fluoroscopically. Under ultrasound guidance, the right common femoral artery was accessed with a micropuncture kit after the overlying soft tissues were anesthetized with 1% lidocaine. An ultrasound image was saved for documentation purposes. The micropuncture sheath was exchanged for a 5 [place] vascular sheath over a Bentson wire. A closure arteriogram was performed through the side of the sheath confirming access within the right common femoral artery. Over a Bentson wire, a Mickelson catheter was advanced to the level of the inferior aspect of the thoracic aorta where it was back bled and flushed. The catheter was then utilized to select the left renal artery and a selective left  renal arteriogram was performed. Next, with the use of a fathom 14 microcatheter, a regular Renegade microcatheter was advanced into a midpole division of the left renal artery. Sub selective arteriogram was performed and the vessel was percutaneously coil embolized with multiple overlapping 2 mm, 3 mm and 4 mm diameter interlock coils. Next, the microcatheter was utilized to an additional midpole division of the left renal artery and a sub selective arteriogram was performed. Next, the vessel was percutaneously coil embolized with multiple overlapping 4 mm, 5 mm and 6 mm interlock coils. The microcatheter was retracted to the level of the main left renal artery and selective left arteriogram was performed. The microcatheter  was removed and a completion arteriogram was performed via the [facility] catheter. Next, prolonged efforts were made to cannulate the known tiny accessory left renal artery however this ultimately proved difficult. As such, a flush aortogram was performed demonstrating patency of the accessory left renal artery. Unfortunately, despite prolonged efforts, the tiny accessory left renal artery could not be selectively cannulated, likely secondary to a combination of suspected narrowing involving the vessel's origin (as was demonstrated on preceding CT) as well as the patient's aneurysmal and tortuous abdominal aorta. At this point, the procedure was terminated. All wires, catheters and sheaths were removed patient. Hemostasis was achieved at the right groin access site with deployment of an ExoSeal closure device and manual compression. A dressing applied. The patient tolerated the procedure well without immediate postprocedural complication. FINDINGS: Selective left renal arteriogram demonstrates marked irregularity and contrast extravasation arising from several midpole subsegmental renal arteries. As such, both co-dominant midpole segmental arteries were sub selective and percutaneously coil embolized. Completion arteriogram demonstrates a technically excellent result without evidence of residual contrast extravasation or vessel irregularity. Completion left renal arteriogram demonstrates patency of several superior pole segmental divisions without additional area contrast extravasation or vessel irregularity. Flush abdominal aortogram demonstrates patency of the known accessory tiny left renal artery. Despite prolonged efforts, the accessory left renal artery could not be selectively cannulated, likely secondary to a combination of suspected narrowing of the vessel's origin (as was demonstrated on preceding CT), as well as the patient's aneurysmal and tortuous abdominal aorta. IMPRESSION: 1. Technically successful  percutaneous coil embolization of two mid pole segmental arteries supplying ill-defined areas of contrast extravasation and vessel irregularity. 2. Attempted, though unsuccessful, cannulation of the known tiny left accessory renal artery likely to secondary to a combination of suspected narrowing of the vessel's origin (as was demonstrated on preceding CT) as well as the patient's aneurysmal and tortuous abdominal aorta. 3. Successful image guided placement of a non tunneled right triple-lumen central venous catheter with tip terminating within the superior cavoatrial junction. The central venous catheter is ready for immediate use. Electronically Signed   By: Sandi Mariscal M.D.   On: 09/15/2019 16:52   Ir US Guide Vasc Access Right  Result Date: 09/15/2019 INDICATION: Fall on anticoagulation, now with left-sided renal fractures and left renal laceration. Request made for left renal arteriogram and potential embolization. Request also made for placement of a image guided central venous catheter for durable intravenous access. EXAM: 1. ULTRASOUND GUIDANCE FOR VENOUS ACCESS 2. FLUOROSCOPIC GUIDED PLACEMENT OF A NON TUNNELED TRIPLE-LUMEN CENTRAL VENOUS CATHETER. 3. ULTRASOUND GUIDANCE FOR ARTERIAL ACCESS 4. FLUSH ABDOMINAL AORTOGRAM 5. SELECTIVE LEFT RENAL ARTERIOGRAM 6. SUB SELECTIVE LEFT ARTERIOGRAM AND PERCUTANEOUS COIL EMBOLIZATION COMPARISON:  CT the chest, abdomen and pelvis - earlier same day MEDICATIONS: NONE ANESTHESIA/SEDATION: Moderate (conscious) sedation was employed during this procedure.  A total of Versed 2 mg and Fentanyl 100 mcg was administered intravenously. Moderate Sedation Time: 85 minutes. The patient's level of consciousness and vital signs were monitored continuously by radiology nursing throughout the procedure under my direct supervision. CONTRAST:  75 cc Omnipaque 300 FLUOROSCOPY TIME:  24 minutes (3,500 mGy) COMPLICATIONS: None immediate. PROCEDURE: Informed consent was obtained from the  patient following explanation of the procedure, risks, benefits and alternatives. The patient understands, agrees and consents for the procedure. All questions were addressed. A time out was performed prior to the initiation of the procedure. Maximal barrier sterile technique utilized including caps, mask, sterile gowns, sterile gloves, large sterile drape, hand hygiene, and Betadine prep. Attention was initially paid towards placement of a non tunneled central venous catheter. Under direct ultrasound guidance, the left internal jugular vein was accessed with a micropuncture kit. A micropuncture wire was utilized for measurement purposes. Under fluoroscopic guidance, a peel-away sheath was placed and a 15 cm triple-lumen non tunneled central venous catheter was inserted with tip terminating at the level the superior cavoatrial junction. Postprocedural spot fluoroscopic radiographic image was obtained. The central venous catheter was secured at the neck entrance site within interrupted suture. A dressing was applied. _________________________________________________________ The right femoral head was marked fluoroscopically. Under ultrasound guidance, the right common femoral artery was accessed with a micropuncture kit after the overlying soft tissues were anesthetized with 1% lidocaine. An ultrasound image was saved for documentation purposes. The micropuncture sheath was exchanged for a 5 [place] vascular sheath over a Bentson wire. A closure arteriogram was performed through the side of the sheath confirming access within the right common femoral artery. Over a Bentson wire, a Mickelson catheter was advanced to the level of the inferior aspect of the thoracic aorta where it was back bled and flushed. The catheter was then utilized to select the left renal artery and a selective left renal arteriogram was performed. Next, with the use of a fathom 14 microcatheter, a regular Renegade microcatheter was advanced into  a midpole division of the left renal artery. Sub selective arteriogram was performed and the vessel was percutaneously coil embolized with multiple overlapping 2 mm, 3 mm and 4 mm diameter interlock coils. Next, the microcatheter was utilized to an additional midpole division of the left renal artery and a sub selective arteriogram was performed. Next, the vessel was percutaneously coil embolized with multiple overlapping 4 mm, 5 mm and 6 mm interlock coils. The microcatheter was retracted to the level of the main left renal artery and selective left arteriogram was performed. The microcatheter was removed and a completion arteriogram was performed via the [facility] catheter. Next, prolonged efforts were made to cannulate the known tiny accessory left renal artery however this ultimately proved difficult. As such, a flush aortogram was performed demonstrating patency of the accessory left renal artery. Unfortunately, despite prolonged efforts, the tiny accessory left renal artery could not be selectively cannulated, likely secondary to a combination of suspected narrowing involving the vessel's origin (as was demonstrated on preceding CT) as well as the patient's aneurysmal and tortuous abdominal aorta. At this point, the procedure was terminated. All wires, catheters and sheaths were removed patient. Hemostasis was achieved at the right groin access site with deployment of an ExoSeal closure device and manual compression. A dressing applied. The patient tolerated the procedure well without immediate postprocedural complication. FINDINGS: Selective left renal arteriogram demonstrates marked irregularity and contrast extravasation arising from several midpole subsegmental renal arteries. As such, both co-dominant midpole  segmental arteries were sub selective and percutaneously coil embolized. Completion arteriogram demonstrates a technically excellent result without evidence of residual contrast extravasation or  vessel irregularity. Completion left renal arteriogram demonstrates patency of several superior pole segmental divisions without additional area contrast extravasation or vessel irregularity. Flush abdominal aortogram demonstrates patency of the known accessory tiny left renal artery. Despite prolonged efforts, the accessory left renal artery could not be selectively cannulated, likely secondary to a combination of suspected narrowing of the vessel's origin (as was demonstrated on preceding CT), as well as the patient's aneurysmal and tortuous abdominal aorta. IMPRESSION: 1. Technically successful percutaneous coil embolization of two mid pole segmental arteries supplying ill-defined areas of contrast extravasation and vessel irregularity. 2. Attempted, though unsuccessful, cannulation of the known tiny left accessory renal artery likely to secondary to a combination of suspected narrowing of the vessel's origin (as was demonstrated on preceding CT) as well as the patient's aneurysmal and tortuous abdominal aorta. 3. Successful image guided placement of a non tunneled right triple-lumen central venous catheter with tip terminating within the superior cavoatrial junction. The central venous catheter is ready for immediate use. Electronically Signed   By: Sandi Mariscal M.D.   On: 09/15/2019 16:52   Dg Chest Port 1 View  Result Date: 09/15/2019 CLINICAL DATA:  Fall with chest pain EXAM: PORTABLE CHEST 1 VIEW COMPARISON:  Chest CT report 12/26/2009 FINDINGS: Low volume chest with generalized interstitial coarsening. There is asymmetric elevation of the left diaphragm not described on prior study. Borderline heart size given portable technique and rightward rotation. Aortic tortuosity. IMPRESSION: 1. Low volume chest interstitial coarsening, question pulmonary fibrosis. 2. Prominent elevation of the left diaphragm. 3. No definite acute finding. Electronically Signed   By: Monte Fantasia M.D.   On: 09/15/2019 10:06    [place] Guide Roadmapping  Result Date: 09/15/2019 INDICATION: Fall on anticoagulation, now with left-sided renal fractures and left renal laceration. Request made for left renal arteriogram and potential embolization. Request also made for placement of a image guided central venous catheter for durable intravenous access. EXAM: 1. ULTRASOUND GUIDANCE FOR VENOUS ACCESS 2. FLUOROSCOPIC GUIDED PLACEMENT OF A NON TUNNELED TRIPLE-LUMEN CENTRAL VENOUS CATHETER. 3. ULTRASOUND GUIDANCE FOR ARTERIAL ACCESS 4. FLUSH ABDOMINAL AORTOGRAM 5. SELECTIVE LEFT RENAL ARTERIOGRAM 6. SUB SELECTIVE LEFT ARTERIOGRAM AND PERCUTANEOUS COIL EMBOLIZATION COMPARISON:  CT the chest, abdomen and pelvis - earlier same day MEDICATIONS: NONE ANESTHESIA/SEDATION: Moderate (conscious) sedation was employed during this procedure. A total of Versed 2 mg and Fentanyl 100 mcg was administered intravenously. Moderate Sedation Time: 85 minutes. The patient's level of consciousness and vital signs were monitored continuously by radiology nursing throughout the procedure under my direct supervision. CONTRAST:  75 cc Omnipaque 300 FLUOROSCOPY TIME:  24 minutes (1,694 mGy) COMPLICATIONS: None immediate. PROCEDURE: Informed consent was obtained from the patient following explanation of the procedure, risks, benefits and alternatives. The patient understands, agrees and consents for the procedure. All questions were addressed. A time out was performed prior to the initiation of the procedure. Maximal barrier sterile technique utilized including caps, mask, sterile gowns, sterile gloves, large sterile drape, hand hygiene, and Betadine prep. Attention was initially paid towards placement of a non tunneled central venous catheter. Under direct ultrasound guidance, the left internal jugular vein was accessed with a micropuncture kit. A micropuncture wire was utilized for measurement purposes. Under fluoroscopic guidance, a  peel-away sheath was placed and a 15 cm triple-lumen non tunneled central venous  catheter was inserted with tip terminating at the level the superior cavoatrial junction. Postprocedural spot fluoroscopic radiographic image was obtained. The central venous catheter was secured at the neck entrance site within interrupted suture. A dressing was applied. _________________________________________________________ The right femoral head was marked fluoroscopically. Under ultrasound guidance, the right common femoral artery was accessed with a micropuncture kit after the overlying soft tissues were anesthetized with 1% lidocaine. An ultrasound image was saved for documentation purposes. The micropuncture sheath was exchanged for a 5 [place] vascular sheath over a Bentson wire. A closure arteriogram was performed through the side of the sheath confirming access within the right common femoral artery. Over a Bentson wire, a Mickelson catheter was advanced to the level of the inferior aspect of the thoracic aorta where it was back bled and flushed. The catheter was then utilized to select the left renal artery and a selective left renal arteriogram was performed. Next, with the use of a fathom 14 microcatheter, a regular Renegade microcatheter was advanced into a midpole division of the left renal artery. Sub selective arteriogram was performed and the vessel was percutaneously coil embolized with multiple overlapping 2 mm, 3 mm and 4 mm diameter interlock coils. Next, the microcatheter was utilized to an additional midpole division of the left renal artery and a sub selective arteriogram was performed. Next, the vessel was percutaneously coil embolized with multiple overlapping 4 mm, 5 mm and 6 mm interlock coils. The microcatheter was retracted to the level of the main left renal artery and selective left arteriogram was performed. The microcatheter was removed and a completion arteriogram was performed via the [facility]  catheter. Next, prolonged efforts were made to cannulate the known tiny accessory left renal artery however this ultimately proved difficult. As such, a flush aortogram was performed demonstrating patency of the accessory left renal artery. Unfortunately, despite prolonged efforts, the tiny accessory left renal artery could not be selectively cannulated, likely secondary to a combination of suspected narrowing involving the vessel's origin (as was demonstrated on preceding CT) as well as the patient's aneurysmal and tortuous abdominal aorta. At this point, the procedure was terminated. All wires, catheters and sheaths were removed patient. Hemostasis was achieved at the right groin access site with deployment of an ExoSeal closure device and manual compression. A dressing applied. The patient tolerated the procedure well without immediate postprocedural complication. FINDINGS: Selective left renal arteriogram demonstrates marked irregularity and contrast extravasation arising from several midpole subsegmental renal arteries. As such, both co-dominant midpole segmental arteries were sub selective and percutaneously coil embolized. Completion arteriogram demonstrates a technically excellent result without evidence of residual contrast extravasation or vessel irregularity. Completion left renal arteriogram demonstrates patency of several superior pole segmental divisions without additional area contrast extravasation or vessel irregularity. Flush abdominal aortogram demonstrates patency of the known accessory tiny left renal artery. Despite prolonged efforts, the accessory left renal artery could not be selectively cannulated, likely secondary to a combination of suspected narrowing of the vessel's origin (as was demonstrated on preceding CT), as well as the patient's aneurysmal and tortuous abdominal aorta. IMPRESSION: 1. Technically successful percutaneous coil embolization of two mid pole segmental arteries  supplying ill-defined areas of contrast extravasation and vessel irregularity. 2. Attempted, though unsuccessful, cannulation of the known tiny left accessory renal artery likely to secondary to a combination of suspected narrowing of the vessel's origin (as was demonstrated on preceding CT) as well as the patient's aneurysmal and tortuous abdominal aorta. 3. Successful image guided placement of a  non tunneled right triple-lumen central venous catheter with tip terminating within the superior cavoatrial junction. The central venous catheter is ready for immediate use. Electronically Signed   By: Sandi Mariscal M.D.   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 Keep on bedrest for now. If hgb remains fairly stable and VSS may be able to mobilize and transfer tomorrow. Repeat CXR today, IS!!!  LOS: 1 day    Brigid Re , [facility] Surgery 09/16/2019, 9:06 AM Please see Amion for pager number during day hours 7:00am-4:30pm

## 2019-09-16 NOTE — Progress Notes (Signed)
Referring Physician(s): ED patient  Supervising Physician: Jacqulynn Cadet  Patient Status:  [facility] - In-pt  Chief Complaint:  Renal laceration S/P embolization yesterday by Dr. Pascal Lux  Subjective:  [name] is doing ok. He is sitting up in bed. No complaints other than he has to use the bathroom.  Allergies: Penicillins  Medications: Prior to Admission medications   Medication Sig Start Date End Date Taking? Authorizing Provider  atorvastatin (LIPITOR) 80 MG tablet Take 80 mg by mouth daily. 07/28/19  Yes [provider]  B Complex-C (B-COMPLEX WITH VITAMIN C) tablet Take 1 tablet by mouth daily.   Yes [provider]  Bioflavonoid Products (ESTER C PO) Take 1 tablet by mouth daily.   Yes [provider]  losartan-hydrochlorothiazide (HYZAAR) 100-25 MG per tablet Take 1 tablet by mouth daily.    Yes [provider]  metoprolol succinate (TOPROL-XL) 50 MG 24 hr tablet Take 1 tablet (50 mg total) by mouth daily. Take with or immediately following a meal. Patient taking differently: Take 25 mg by mouth daily. Take with or immediately following a meal. 10/01/12  Yes Bynum Bellows, MD  Multiple Vitamin (MULTIVITAMIN WITH MINERALS) TABS tablet Take 1 tablet by mouth daily.   Yes [provider]  Multiple Vitamins-Minerals (PRESERVISION AREDS 2 PO) Take 1 tablet by mouth daily.   Yes [provider]  Rivaroxaban (XARELTO) 20 MG TABS Take 1 tablet (20 mg total) by mouth daily. Please resume on 10/05/2012 after ENT evaluation by Dr. Constance Holster, please discuss with Dr. Constance Holster prior to taking this medication. 10/01/12  Yes Bynum Bellows, MD  traMADol (ULTRAM) 50 MG tablet Take 50-100 mg by mouth every 6 (six) hours as needed for moderate pain.    Yes [provider]     Vital Signs: BP 137/88    Pulse (!) 124    Temp 97.8 F (36.6 C) (Oral)    Resp (!) 28    Ht 6' (1.829 m)    Wt 112.3 kg    SpO2 93%    BMI 33.58 kg/m    Physical Exam Awake and alert Right groin stick ok, no hematoma.  Imaging: Ct Head Wo Contrast  Result Date: 09/15/2019 CLINICAL DATA:  Trauma, fall EXAM: CT HEAD WITHOUT CONTRAST CT CERVICAL SPINE WITHOUT CONTRAST TECHNIQUE: Multidetector CT imaging of the head and cervical spine was performed following the standard protocol without intravenous contrast. Multiplanar CT image reconstructions of the cervical spine were also generated. COMPARISON:  None. FINDINGS: CT HEAD FINDINGS Brain: No evidence of acute infarction, hemorrhage, hydrocephalus, extra-axial collection or mass lesion/mass effect. This periventricular and deep white matter hypodensity with global volume loss. Vascular: No hyperdense vessel or unexpected calcification. Skull: Normal. Negative for fracture or focal lesion. Sinuses/Orbits: No acute finding. Other: None. CT CERVICAL SPINE FINDINGS Alignment: Degenerative straightening of the normal cervical lordosis. Skull base and vertebrae: No acute fracture. No primary bone lesion or focal pathologic process. Soft tissues and spinal canal: No prevertebral fluid or swelling. No visible canal hematoma. Disc levels: Severe multilevel disc degenerative disease and partial ankylosis of the cervical spine. Upper chest: Negative. Other: Aortic atherosclerosis. IMPRESSION: 1. No acute intracranial pathology. Small-vessel white matter disease and global volume loss in keeping with advanced patient age. 2. No fracture or static subluxation of the cervical spine. Severe multilevel disc degenerative disease. 3.  Aortic Atherosclerosis (ICD10-I70.0). Electronically Signed   By: Eddie Candle M.D.   On: 09/15/2019 12:20   Ct  Chest W Contrast  Result Date: 09/15/2019 CLINICAL DATA:  Chest trauma, fall, pain in ribs and back, history of colon cancer EXAM: CT CHEST, ABDOMEN, AND PELVIS WITH CONTRAST TECHNIQUE: Multidetector CT imaging of the chest, abdomen and pelvis was performed following the standard  protocol during bolus administration of intravenous contrast. CONTRAST:  91m OMNIPAQUE IOHEXOL 300 MG/ML  SOLN COMPARISON:  None. FINDINGS: CT CHEST FINDINGS Cardiovascular: Aortic atherosclerosis. Cardiomegaly. Three-vessel coronary artery calcifications. No pericardial effusion. Mediastinum/Nodes: No enlarged mediastinal, hilar, or axillary lymph nodes. Thyroid gland, trachea, and esophagus demonstrate no significant findings. Lungs/Pleura: Lungs are clear. Large left-sided diaphragmatic eventration. Trace left pleural effusion. Musculoskeletal: No chest wall mass or suspicious bone lesions identified. There are minimally displaced fractures of the lateral and posterior left seventh through eleventh ribs. CT ABDOMEN PELVIS FINDINGS Hepatobiliary: No solid liver abnormality is seen. Gallstones. No gallbladder wall thickening, or biliary dilatation. Pancreas: Unremarkable. No pancreatic ductal dilatation or surrounding inflammatory changes. Spleen: Normal in size without significant abnormality. Adrenals/Urinary Tract: Adrenal glands are unremarkable. There is a laceration and large hematoma of the inferior pole of the left kidney with multiple internal foci of contrast attenuation concerning for active extravasation (series 3, image 63, 69). There is extensive fluid and contrast in the perirenal fascia and retroperitoneal soft tissues overlying the psoas. Bladder is unremarkable. Stomach/Bowel: Stomach is within normal limits. No evidence of bowel wall thickening, distention, or inflammatory changes. Evidence of prior sigmoid resection and reanastomosis Vascular/Lymphatic: Aortic atherosclerosis. Infrarenal abdominal aortic aneurysm measuring 4.2 x 4.1 cm. No enlarged abdominal or pelvic lymph nodes. Reproductive: No mass or other abnormality. Other: There is a midline ventral hernia containing nonobstructed loops of small bowel, hernia sac measuring 11.2 x 3.7 cm and neck measuring 4.8 cm (series 3, image 93,  series 7, image 112). No abdominopelvic ascites. Musculoskeletal: No acute or significant osseous findings. IMPRESSION: 1. There is a laceration and large hematoma of the inferior pole of the left kidney with multiple internal foci of contrast attenuation concerning for active extravasation (series 3, image 63, 69). There is extensive fluid and contrast in the perirenal fascia and retroperitoneal soft tissues overlying the psoas. 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 2. There are minimally displaced fractures of the overlying lateral and posterior left seventh through eleventh ribs. 3.  Trace left pleural effusion. 4. 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 5. There is a midline ventral hernia containing nonobstructed loops of small bowel, hernia sac measuring 11.2 x 3.7 cm and neck measuring 4.8 cm (series 3, image 93, series 7, image 112). 6. Infrarenal abdominal aortic aneurysm measuring 4.2 x 4.1 cm. Aortic Atherosclerosis (ICD10-I70.0). 7.  Cardiomegaly and coronary artery disease. 8.  Evidence of prior sigmoid colon resection and reanastomosis. 9.  Cholelithiasis. These results were called by telephone at the time of interpretation on 09/15/2019 at 12:44 pm to provider DVeryl Speak, who verbally acknowledged these results. Electronically Signed   By: AEddie CandleM.D.   On: 09/15/2019 12:49   Ct Cervical Spine Wo Contrast  Result Date: 09/15/2019 CLINICAL DATA:  Trauma, fall EXAM: CT HEAD WITHOUT CONTRAST CT CERVICAL SPINE WITHOUT CONTRAST TECHNIQUE: Multidetector CT imaging of the head and cervical spine was performed following the standard protocol without intravenous contrast. Multiplanar CT image reconstructions of the cervical spine were also generated. COMPARISON:  None. FINDINGS: CT HEAD FINDINGS Brain: No  evidence of acute infarction, hemorrhage,  hydrocephalus, extra-axial collection or mass lesion/mass effect. This periventricular and deep white matter hypodensity with global volume loss. Vascular: No hyperdense vessel or unexpected calcification. Skull: Normal. Negative for fracture or focal lesion. Sinuses/Orbits: No acute finding. Other: None. CT CERVICAL SPINE FINDINGS Alignment: Degenerative straightening of the normal cervical lordosis. Skull base and vertebrae: No acute fracture. No primary bone lesion or focal pathologic process. Soft tissues and spinal canal: No prevertebral fluid or swelling. No visible canal hematoma. Disc levels: Severe multilevel disc degenerative disease and partial ankylosis of the cervical spine. Upper chest: Negative. Other: Aortic atherosclerosis. IMPRESSION: 1. No acute intracranial pathology. Small-vessel white matter disease and global volume loss in keeping with advanced patient age. 2. No fracture or static subluxation of the cervical spine. Severe multilevel disc degenerative disease. 3.  Aortic Atherosclerosis (ICD10-I70.0). Electronically Signed   By: Eddie Candle M.D.   On: 09/15/2019 12:20   Ct Abdomen Pelvis W Contrast  Result Date: 09/15/2019 CLINICAL DATA:  Chest trauma, fall, pain in ribs and back, history of colon cancer EXAM: CT CHEST, ABDOMEN, AND PELVIS WITH CONTRAST TECHNIQUE: Multidetector CT imaging of the chest, abdomen and pelvis was performed following the standard protocol during bolus administration of intravenous contrast. CONTRAST:  60m OMNIPAQUE IOHEXOL 300 MG/ML  SOLN COMPARISON:  None. FINDINGS: CT CHEST FINDINGS Cardiovascular: Aortic atherosclerosis. Cardiomegaly. Three-vessel coronary artery calcifications. No pericardial effusion. Mediastinum/Nodes: No enlarged mediastinal, hilar, or axillary lymph nodes. Thyroid gland, trachea, and esophagus demonstrate no significant findings. Lungs/Pleura: Lungs are clear. Large left-sided diaphragmatic eventration. Trace left pleural effusion.  Musculoskeletal: No chest wall mass or suspicious bone lesions identified. There are minimally displaced fractures of the lateral and posterior left seventh through eleventh ribs. CT ABDOMEN PELVIS FINDINGS Hepatobiliary: No solid liver abnormality is seen. Gallstones. No gallbladder wall thickening, or biliary dilatation. Pancreas: Unremarkable. No pancreatic ductal dilatation or surrounding inflammatory changes. Spleen: Normal in size without significant abnormality. Adrenals/Urinary Tract: Adrenal glands are unremarkable. There is a laceration and large hematoma of the inferior pole of the left kidney with multiple internal foci of contrast attenuation concerning for active extravasation (series 3, image 63, 69). There is extensive fluid and contrast in the perirenal fascia and retroperitoneal soft tissues overlying the psoas. Bladder is unremarkable. Stomach/Bowel: Stomach is within normal limits. No evidence of bowel wall thickening, distention, or inflammatory changes. Evidence of prior sigmoid resection and reanastomosis Vascular/Lymphatic: Aortic atherosclerosis. Infrarenal abdominal aortic aneurysm measuring 4.2 x 4.1 cm. No enlarged abdominal or pelvic lymph nodes. Reproductive: No mass or other abnormality. Other: There is a midline ventral hernia containing nonobstructed loops of small bowel, hernia sac measuring 11.2 x 3.7 cm and neck measuring 4.8 cm (series 3, image 93, series 7, image 112). No abdominopelvic ascites. Musculoskeletal: No acute or significant osseous findings. IMPRESSION: 1. There is a laceration and large hematoma of the inferior pole of the left kidney with multiple internal foci of contrast attenuation concerning for active extravasation (series 3, image 63, 69). There is extensive fluid and contrast in the perirenal fascia and retroperitoneal soft tissues overlying the psoas. 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 2. There are minimally displaced fractures  of the overlying lateral and posterior left seventh through eleventh ribs. 3.  Trace left pleural effusion. 4. 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 5. There is a midline ventral hernia  containing nonobstructed loops of small bowel, hernia sac measuring 11.2 x 3.7 cm and neck measuring 4.8 cm (series 3, image 93, series 7, image 112). 6. Infrarenal abdominal aortic aneurysm measuring 4.2 x 4.1 cm. Aortic Atherosclerosis (ICD10-I70.0). 7.  Cardiomegaly and coronary artery disease. 8.  Evidence of prior sigmoid colon resection and reanastomosis. 9.  Cholelithiasis. These results were called by telephone at the time of interpretation on 09/15/2019 at 12:44 pm to provider Veryl Speak , who verbally acknowledged these results. Electronically Signed   By: Eddie Candle M.D.   On: 09/15/2019 12:49   Ir Angiogram Renal Uni Selective  Result Date: 09/15/2019 INDICATION: Fall on anticoagulation, now with left-sided renal fractures and left renal laceration. Request made for left renal arteriogram and potential embolization. Request also made for placement of a image guided central venous catheter for durable intravenous access. EXAM: 1. ULTRASOUND GUIDANCE FOR VENOUS ACCESS 2. FLUOROSCOPIC GUIDED PLACEMENT OF A NON TUNNELED TRIPLE-LUMEN CENTRAL VENOUS CATHETER. 3. ULTRASOUND GUIDANCE FOR ARTERIAL ACCESS 4. FLUSH ABDOMINAL AORTOGRAM 5. SELECTIVE LEFT RENAL ARTERIOGRAM 6. SUB SELECTIVE LEFT ARTERIOGRAM AND PERCUTANEOUS COIL EMBOLIZATION COMPARISON:  CT the chest, abdomen and pelvis - earlier same day MEDICATIONS: NONE ANESTHESIA/SEDATION: Moderate (conscious) sedation was employed during this procedure. A total of Versed 2 mg and Fentanyl 100 mcg was administered intravenously. Moderate Sedation Time: 85 minutes. The patient's level of consciousness and vital signs were monitored continuously by  radiology nursing throughout the procedure under my direct supervision. CONTRAST:  75 cc Omnipaque 300 FLUOROSCOPY TIME:  24 minutes (3,382 mGy) COMPLICATIONS: None immediate. PROCEDURE: Informed consent was obtained from the patient following explanation of the procedure, risks, benefits and alternatives. The patient understands, agrees and consents for the procedure. All questions were addressed. A time out was performed prior to the initiation of the procedure. Maximal barrier sterile technique utilized including caps, mask, sterile gowns, sterile gloves, large sterile drape, hand hygiene, and Betadine prep. Attention was initially paid towards placement of a non tunneled central venous catheter. Under direct ultrasound guidance, the left internal jugular vein was accessed with a micropuncture kit. A micropuncture wire was utilized for measurement purposes. Under fluoroscopic guidance, a peel-away sheath was placed and a 15 cm triple-lumen non tunneled central venous catheter was inserted with tip terminating at the level the superior cavoatrial junction. Postprocedural spot fluoroscopic radiographic image was obtained. The central venous catheter was secured at the neck entrance site within interrupted suture. A dressing was applied. _________________________________________________________ The right femoral head was marked fluoroscopically. Under ultrasound guidance, the right common femoral artery was accessed with a micropuncture kit after the overlying soft tissues were anesthetized with 1% lidocaine. An ultrasound image was saved for documentation purposes. The micropuncture sheath was exchanged for a 5 [place] vascular sheath over a Bentson wire. A closure arteriogram was performed through the side of the sheath confirming access within the right common femoral artery. Over a Bentson wire, a Mickelson catheter was advanced to the level of the inferior aspect of the thoracic aorta where it was back bled and  flushed. The catheter was then utilized to select the left renal artery and a selective left renal arteriogram was performed. Next, with the use of a fathom 14 microcatheter, a regular Renegade microcatheter was advanced into a midpole division of the left renal artery. Sub selective arteriogram was performed and the vessel was percutaneously coil embolized with multiple overlapping 2 mm, 3 mm and 4 mm diameter interlock coils. Next, the microcatheter was utilized to an  additional midpole division of the left renal artery and a sub selective arteriogram was performed. Next, the vessel was percutaneously coil embolized with multiple overlapping 4 mm, 5 mm and 6 mm interlock coils. The microcatheter was retracted to the level of the main left renal artery and selective left arteriogram was performed. The microcatheter was removed and a completion arteriogram was performed via the [facility] catheter. Next, prolonged efforts were made to cannulate the known tiny accessory left renal artery however this ultimately proved difficult. As such, a flush aortogram was performed demonstrating patency of the accessory left renal artery. Unfortunately, despite prolonged efforts, the tiny accessory left renal artery could not be selectively cannulated, likely secondary to a combination of suspected narrowing involving the vessel's origin (as was demonstrated on preceding CT) as well as the patient's aneurysmal and tortuous abdominal aorta. At this point, the procedure was terminated. All wires, catheters and sheaths were removed patient. Hemostasis was achieved at the right groin access site with deployment of an ExoSeal closure device and manual compression. A dressing applied. The patient tolerated the procedure well without immediate postprocedural complication. FINDINGS: Selective left renal arteriogram demonstrates marked irregularity and contrast extravasation arising from several midpole subsegmental renal arteries. As  such, both co-dominant midpole segmental arteries were sub selective and percutaneously coil embolized. Completion arteriogram demonstrates a technically excellent result without evidence of residual contrast extravasation or vessel irregularity. Completion left renal arteriogram demonstrates patency of several superior pole segmental divisions without additional area contrast extravasation or vessel irregularity. Flush abdominal aortogram demonstrates patency of the known accessory tiny left renal artery. Despite prolonged efforts, the accessory left renal artery could not be selectively cannulated, likely secondary to a combination of suspected narrowing of the vessel's origin (as was demonstrated on preceding CT), as well as the patient's aneurysmal and tortuous abdominal aorta. IMPRESSION: 1. Technically successful percutaneous coil embolization of two mid pole segmental arteries supplying ill-defined areas of contrast extravasation and vessel irregularity. 2. Attempted, though unsuccessful, cannulation of the known tiny left accessory renal artery likely to secondary to a combination of suspected narrowing of the vessel's origin (as was demonstrated on preceding CT) as well as the patient's aneurysmal and tortuous abdominal aorta. 3. Successful image guided placement of a non tunneled right triple-lumen central venous catheter with tip terminating within the superior cavoatrial junction. The central venous catheter is ready for immediate use. Electronically Signed   By: Sandi Mariscal M.D.   On: 09/15/2019 16:52   Ir Fluoro Guide Cv Line Right  Result Date: 09/15/2019 INDICATION: Fall on anticoagulation, now with left-sided renal fractures and left renal laceration. Request made for left renal arteriogram and potential embolization. Request also made for placement of a image guided central venous catheter for durable intravenous access. EXAM: 1. ULTRASOUND GUIDANCE FOR VENOUS ACCESS 2. FLUOROSCOPIC GUIDED  PLACEMENT OF A NON TUNNELED TRIPLE-LUMEN CENTRAL VENOUS CATHETER. 3. ULTRASOUND GUIDANCE FOR ARTERIAL ACCESS 4. FLUSH ABDOMINAL AORTOGRAM 5. SELECTIVE LEFT RENAL ARTERIOGRAM 6. SUB SELECTIVE LEFT ARTERIOGRAM AND PERCUTANEOUS COIL EMBOLIZATION COMPARISON:  CT the chest, abdomen and pelvis - earlier same day MEDICATIONS: NONE ANESTHESIA/SEDATION: Moderate (conscious) sedation was employed during this procedure. A total of Versed 2 mg and Fentanyl 100 mcg was administered intravenously. Moderate Sedation Time: 85 minutes. The patient's level of consciousness and vital signs were monitored continuously by radiology nursing throughout the procedure under my direct supervision. CONTRAST:  75 cc Omnipaque 300 FLUOROSCOPY TIME:  24 minutes (4,098 mGy) COMPLICATIONS: None immediate. PROCEDURE: Informed consent was obtained  from the patient following explanation of the procedure, risks, benefits and alternatives. The patient understands, agrees and consents for the procedure. All questions were addressed. A time out was performed prior to the initiation of the procedure. Maximal barrier sterile technique utilized including caps, mask, sterile gowns, sterile gloves, large sterile drape, hand hygiene, and Betadine prep. Attention was initially paid towards placement of a non tunneled central venous catheter. Under direct ultrasound guidance, the left internal jugular vein was accessed with a micropuncture kit. A micropuncture wire was utilized for measurement purposes. Under fluoroscopic guidance, a peel-away sheath was placed and a 15 cm triple-lumen non tunneled central venous catheter was inserted with tip terminating at the level the superior cavoatrial junction. Postprocedural spot fluoroscopic radiographic image was obtained. The central venous catheter was secured at the neck entrance site within interrupted suture. A dressing was applied. _________________________________________________________ The right femoral head  was marked fluoroscopically. Under ultrasound guidance, the right common femoral artery was accessed with a micropuncture kit after the overlying soft tissues were anesthetized with 1% lidocaine. An ultrasound image was saved for documentation purposes. The micropuncture sheath was exchanged for a 5 [place] vascular sheath over a Bentson wire. A closure arteriogram was performed through the side of the sheath confirming access within the right common femoral artery. Over a Bentson wire, a Mickelson catheter was advanced to the level of the inferior aspect of the thoracic aorta where it was back bled and flushed. The catheter was then utilized to select the left renal artery and a selective left renal arteriogram was performed. Next, with the use of a fathom 14 microcatheter, a regular Renegade microcatheter was advanced into a midpole division of the left renal artery. Sub selective arteriogram was performed and the vessel was percutaneously coil embolized with multiple overlapping 2 mm, 3 mm and 4 mm diameter interlock coils. Next, the microcatheter was utilized to an additional midpole division of the left renal artery and a sub selective arteriogram was performed. Next, the vessel was percutaneously coil embolized with multiple overlapping 4 mm, 5 mm and 6 mm interlock coils. The microcatheter was retracted to the level of the main left renal artery and selective left arteriogram was performed. The microcatheter was removed and a completion arteriogram was performed via the [facility] catheter. Next, prolonged efforts were made to cannulate the known tiny accessory left renal artery however this ultimately proved difficult. As such, a flush aortogram was performed demonstrating patency of the accessory left renal artery. Unfortunately, despite prolonged efforts, the tiny accessory left renal artery could not be selectively cannulated, likely secondary to a combination of suspected narrowing involving the vessel's  origin (as was demonstrated on preceding CT) as well as the patient's aneurysmal and tortuous abdominal aorta. At this point, the procedure was terminated. All wires, catheters and sheaths were removed patient. Hemostasis was achieved at the right groin access site with deployment of an ExoSeal closure device and manual compression. A dressing applied. The patient tolerated the procedure well without immediate postprocedural complication. FINDINGS: Selective left renal arteriogram demonstrates marked irregularity and contrast extravasation arising from several midpole subsegmental renal arteries. As such, both co-dominant midpole segmental arteries were sub selective and percutaneously coil embolized. Completion arteriogram demonstrates a technically excellent result without evidence of residual contrast extravasation or vessel irregularity. Completion left renal arteriogram demonstrates patency of several superior pole segmental divisions without additional area contrast extravasation or vessel irregularity. Flush abdominal aortogram demonstrates patency of the known accessory tiny left renal artery. Despite prolonged  efforts, the accessory left renal artery could not be selectively cannulated, likely secondary to a combination of suspected narrowing of the vessel's origin (as was demonstrated on preceding CT), as well as the patient's aneurysmal and tortuous abdominal aorta. IMPRESSION: 1. Technically successful percutaneous coil embolization of two mid pole segmental arteries supplying ill-defined areas of contrast extravasation and vessel irregularity. 2. Attempted, though unsuccessful, cannulation of the known tiny left accessory renal artery likely to secondary to a combination of suspected narrowing of the vessel's origin (as was demonstrated on preceding CT) as well as the patient's aneurysmal and tortuous abdominal aorta. 3. Successful image guided placement of a non tunneled right triple-lumen central  venous catheter with tip terminating within the superior cavoatrial junction. The central venous catheter is ready for immediate use. Electronically Signed   By: Sandi Mariscal M.D.   On: 09/15/2019 16:52   Ir US Guide Vasc Access Right  Result Date: 09/15/2019 INDICATION: Fall on anticoagulation, now with left-sided renal fractures and left renal laceration. Request made for left renal arteriogram and potential embolization. Request also made for placement of a image guided central venous catheter for durable intravenous access. EXAM: 1. ULTRASOUND GUIDANCE FOR VENOUS ACCESS 2. FLUOROSCOPIC GUIDED PLACEMENT OF A NON TUNNELED TRIPLE-LUMEN CENTRAL VENOUS CATHETER. 3. ULTRASOUND GUIDANCE FOR ARTERIAL ACCESS 4. FLUSH ABDOMINAL AORTOGRAM 5. SELECTIVE LEFT RENAL ARTERIOGRAM 6. SUB SELECTIVE LEFT ARTERIOGRAM AND PERCUTANEOUS COIL EMBOLIZATION COMPARISON:  CT the chest, abdomen and pelvis - earlier same day MEDICATIONS: NONE ANESTHESIA/SEDATION: Moderate (conscious) sedation was employed during this procedure. A total of Versed 2 mg and Fentanyl 100 mcg was administered intravenously. Moderate Sedation Time: 85 minutes. The patient's level of consciousness and vital signs were monitored continuously by radiology nursing throughout the procedure under my direct supervision. CONTRAST:  75 cc Omnipaque 300 FLUOROSCOPY TIME:  24 minutes (1,638 mGy) COMPLICATIONS: None immediate. PROCEDURE: Informed consent was obtained from the patient following explanation of the procedure, risks, benefits and alternatives. The patient understands, agrees and consents for the procedure. All questions were addressed. A time out was performed prior to the initiation of the procedure. Maximal barrier sterile technique utilized including caps, mask, sterile gowns, sterile gloves, large sterile drape, hand hygiene, and Betadine prep. Attention was initially paid towards placement of a non tunneled central venous catheter. Under direct ultrasound  guidance, the left internal jugular vein was accessed with a micropuncture kit. A micropuncture wire was utilized for measurement purposes. Under fluoroscopic guidance, a peel-away sheath was placed and a 15 cm triple-lumen non tunneled central venous catheter was inserted with tip terminating at the level the superior cavoatrial junction. Postprocedural spot fluoroscopic radiographic image was obtained. The central venous catheter was secured at the neck entrance site within interrupted suture. A dressing was applied. _________________________________________________________ The right femoral head was marked fluoroscopically. Under ultrasound guidance, the right common femoral artery was accessed with a micropuncture kit after the overlying soft tissues were anesthetized with 1% lidocaine. An ultrasound image was saved for documentation purposes. The micropuncture sheath was exchanged for a 5 [place] vascular sheath over a Bentson wire. A closure arteriogram was performed through the side of the sheath confirming access within the right common femoral artery. Over a Bentson wire, a Mickelson catheter was advanced to the level of the inferior aspect of the thoracic aorta where it was back bled and flushed. The catheter was then utilized to select the left renal artery and a selective left renal arteriogram was performed. Next, with the use of a  fathom 14 microcatheter, a regular Renegade microcatheter was advanced into a midpole division of the left renal artery. Sub selective arteriogram was performed and the vessel was percutaneously coil embolized with multiple overlapping 2 mm, 3 mm and 4 mm diameter interlock coils. Next, the microcatheter was utilized to an additional midpole division of the left renal artery and a sub selective arteriogram was performed. Next, the vessel was percutaneously coil embolized with multiple overlapping 4 mm, 5 mm and 6 mm interlock coils. The microcatheter was retracted to the level  of the main left renal artery and selective left arteriogram was performed. The microcatheter was removed and a completion arteriogram was performed via the [facility] catheter. Next, prolonged efforts were made to cannulate the known tiny accessory left renal artery however this ultimately proved difficult. As such, a flush aortogram was performed demonstrating patency of the accessory left renal artery. Unfortunately, despite prolonged efforts, the tiny accessory left renal artery could not be selectively cannulated, likely secondary to a combination of suspected narrowing involving the vessel's origin (as was demonstrated on preceding CT) as well as the patient's aneurysmal and tortuous abdominal aorta. At this point, the procedure was terminated. All wires, catheters and sheaths were removed patient. Hemostasis was achieved at the right groin access site with deployment of an ExoSeal closure device and manual compression. A dressing applied. The patient tolerated the procedure well without immediate postprocedural complication. FINDINGS: Selective left renal arteriogram demonstrates marked irregularity and contrast extravasation arising from several midpole subsegmental renal arteries. As such, both co-dominant midpole segmental arteries were sub selective and percutaneously coil embolized. Completion arteriogram demonstrates a technically excellent result without evidence of residual contrast extravasation or vessel irregularity. Completion left renal arteriogram demonstrates patency of several superior pole segmental divisions without additional area contrast extravasation or vessel irregularity. Flush abdominal aortogram demonstrates patency of the known accessory tiny left renal artery. Despite prolonged efforts, the accessory left renal artery could not be selectively cannulated, likely secondary to a combination of suspected narrowing of the vessel's origin (as was demonstrated on preceding CT), as well as  the patient's aneurysmal and tortuous abdominal aorta. IMPRESSION: 1. Technically successful percutaneous coil embolization of two mid pole segmental arteries supplying ill-defined areas of contrast extravasation and vessel irregularity. 2. Attempted, though unsuccessful, cannulation of the known tiny left accessory renal artery likely to secondary to a combination of suspected narrowing of the vessel's origin (as was demonstrated on preceding CT) as well as the patient's aneurysmal and tortuous abdominal aorta. 3. Successful image guided placement of a non tunneled right triple-lumen central venous catheter with tip terminating within the superior cavoatrial junction. The central venous catheter is ready for immediate use. Electronically Signed   By: Sandi Mariscal M.D.   On: 09/15/2019 16:52   Ir US Guide Vasc Access Right  Result Date: 09/15/2019 INDICATION: Fall on anticoagulation, now with left-sided renal fractures and left renal laceration. Request made for left renal arteriogram and potential embolization. Request also made for placement of a image guided central venous catheter for durable intravenous access. EXAM: 1. ULTRASOUND GUIDANCE FOR VENOUS ACCESS 2. FLUOROSCOPIC GUIDED PLACEMENT OF A NON TUNNELED TRIPLE-LUMEN CENTRAL VENOUS CATHETER. 3. ULTRASOUND GUIDANCE FOR ARTERIAL ACCESS 4. FLUSH ABDOMINAL AORTOGRAM 5. SELECTIVE LEFT RENAL ARTERIOGRAM 6. SUB SELECTIVE LEFT ARTERIOGRAM AND PERCUTANEOUS COIL EMBOLIZATION COMPARISON:  CT the chest, abdomen and pelvis - earlier same day MEDICATIONS: NONE ANESTHESIA/SEDATION: Moderate (conscious) sedation was employed during this procedure. A total of Versed 2 mg and Fentanyl 100 mcg  was administered intravenously. Moderate Sedation Time: 85 minutes. The patient's level of consciousness and vital signs were monitored continuously by radiology nursing throughout the procedure under my direct supervision. CONTRAST:  75 cc Omnipaque 300 FLUOROSCOPY TIME:  24 minutes  (8,315 mGy) COMPLICATIONS: None immediate. PROCEDURE: Informed consent was obtained from the patient following explanation of the procedure, risks, benefits and alternatives. The patient understands, agrees and consents for the procedure. All questions were addressed. A time out was performed prior to the initiation of the procedure. Maximal barrier sterile technique utilized including caps, mask, sterile gowns, sterile gloves, large sterile drape, hand hygiene, and Betadine prep. Attention was initially paid towards placement of a non tunneled central venous catheter. Under direct ultrasound guidance, the left internal jugular vein was accessed with a micropuncture kit. A micropuncture wire was utilized for measurement purposes. Under fluoroscopic guidance, a peel-away sheath was placed and a 15 cm triple-lumen non tunneled central venous catheter was inserted with tip terminating at the level the superior cavoatrial junction. Postprocedural spot fluoroscopic radiographic image was obtained. The central venous catheter was secured at the neck entrance site within interrupted suture. A dressing was applied. _________________________________________________________ The right femoral head was marked fluoroscopically. Under ultrasound guidance, the right common femoral artery was accessed with a micropuncture kit after the overlying soft tissues were anesthetized with 1% lidocaine. An ultrasound image was saved for documentation purposes. The micropuncture sheath was exchanged for a 5 [place] vascular sheath over a Bentson wire. A closure arteriogram was performed through the side of the sheath confirming access within the right common femoral artery. Over a Bentson wire, a Mickelson catheter was advanced to the level of the inferior aspect of the thoracic aorta where it was back bled and flushed. The catheter was then utilized to select the left renal artery and a selective left renal arteriogram was performed. Next,  with the use of a fathom 14 microcatheter, a regular Renegade microcatheter was advanced into a midpole division of the left renal artery. Sub selective arteriogram was performed and the vessel was percutaneously coil embolized with multiple overlapping 2 mm, 3 mm and 4 mm diameter interlock coils. Next, the microcatheter was utilized to an additional midpole division of the left renal artery and a sub selective arteriogram was performed. Next, the vessel was percutaneously coil embolized with multiple overlapping 4 mm, 5 mm and 6 mm interlock coils. The microcatheter was retracted to the level of the main left renal artery and selective left arteriogram was performed. The microcatheter was removed and a completion arteriogram was performed via the [facility] catheter. Next, prolonged efforts were made to cannulate the known tiny accessory left renal artery however this ultimately proved difficult. As such, a flush aortogram was performed demonstrating patency of the accessory left renal artery. Unfortunately, despite prolonged efforts, the tiny accessory left renal artery could not be selectively cannulated, likely secondary to a combination of suspected narrowing involving the vessel's origin (as was demonstrated on preceding CT) as well as the patient's aneurysmal and tortuous abdominal aorta. At this point, the procedure was terminated. All wires, catheters and sheaths were removed patient. Hemostasis was achieved at the right groin access site with deployment of an ExoSeal closure device and manual compression. A dressing applied. The patient tolerated the procedure well without immediate postprocedural complication. FINDINGS: Selective left renal arteriogram demonstrates marked irregularity and contrast extravasation arising from several midpole subsegmental renal arteries. As such, both co-dominant midpole segmental arteries were sub selective and percutaneously coil embolized. Completion  arteriogram  demonstrates a technically excellent result without evidence of residual contrast extravasation or vessel irregularity. Completion left renal arteriogram demonstrates patency of several superior pole segmental divisions without additional area contrast extravasation or vessel irregularity. Flush abdominal aortogram demonstrates patency of the known accessory tiny left renal artery. Despite prolonged efforts, the accessory left renal artery could not be selectively cannulated, likely secondary to a combination of suspected narrowing of the vessel's origin (as was demonstrated on preceding CT), as well as the patient's aneurysmal and tortuous abdominal aorta. IMPRESSION: 1. Technically successful percutaneous coil embolization of two mid pole segmental arteries supplying ill-defined areas of contrast extravasation and vessel irregularity. 2. Attempted, though unsuccessful, cannulation of the known tiny left accessory renal artery likely to secondary to a combination of suspected narrowing of the vessel's origin (as was demonstrated on preceding CT) as well as the patient's aneurysmal and tortuous abdominal aorta. 3. Successful image guided placement of a non tunneled right triple-lumen central venous catheter with tip terminating within the superior cavoatrial junction. The central venous catheter is ready for immediate use. Electronically Signed   By: Sandi Mariscal M.D.   On: 09/15/2019 16:52   Dg Chest Port 1 View  Result Date: 09/16/2019 CLINICAL DATA:  Rib fractures -left EXAM: PORTABLE CHEST 1 VIEW COMPARISON:  09/15/2019 FINDINGS: RIGHT-sided central line tip overlies the superior vena cava. There is persistent cardiomegaly, stable in configuration. Dense atherosclerotic calcification of the thoracic aorta. Stable elevation of the LEFT hemidiaphragm. Interstitial thickening, likely related to mild edema. Distended bowel loops beneath the hemidiaphragms. IMPRESSION: 1. Stable cardiomegaly and mild edema. 2.  Stable elevation of the LEFT hemidiaphragm. 3. Distended bowel loops beneath the hemidiaphragms. Electronically Signed   By: Nolon Nations M.D.   On: 09/16/2019 09:56   Dg Chest Port 1 View  Result Date: 09/15/2019 CLINICAL DATA:  Fall with chest pain EXAM: PORTABLE CHEST 1 VIEW COMPARISON:  Chest CT report 12/26/2009 FINDINGS: Low volume chest with generalized interstitial coarsening. There is asymmetric elevation of the left diaphragm not described on prior study. Borderline heart size given portable technique and rightward rotation. Aortic tortuosity. IMPRESSION: 1. Low volume chest interstitial coarsening, question pulmonary fibrosis. 2. Prominent elevation of the left diaphragm. 3. No definite acute finding. Electronically Signed   By: Monte Fantasia M.D.   On: 09/15/2019 10:06   [place] Guide Roadmapping  Result Date: 09/15/2019 INDICATION: Fall on anticoagulation, now with left-sided renal fractures and left renal laceration. Request made for left renal arteriogram and potential embolization. Request also made for placement of a image guided central venous catheter for durable intravenous access. EXAM: 1. ULTRASOUND GUIDANCE FOR VENOUS ACCESS 2. FLUOROSCOPIC GUIDED PLACEMENT OF A NON TUNNELED TRIPLE-LUMEN CENTRAL VENOUS CATHETER. 3. ULTRASOUND GUIDANCE FOR ARTERIAL ACCESS 4. FLUSH ABDOMINAL AORTOGRAM 5. SELECTIVE LEFT RENAL ARTERIOGRAM 6. SUB SELECTIVE LEFT ARTERIOGRAM AND PERCUTANEOUS COIL EMBOLIZATION COMPARISON:  CT the chest, abdomen and pelvis - earlier same day MEDICATIONS: NONE ANESTHESIA/SEDATION: Moderate (conscious) sedation was employed during this procedure. A total of Versed 2 mg and Fentanyl 100 mcg was administered intravenously. Moderate Sedation Time: 85 minutes. The patient's level of consciousness and vital signs were monitored continuously by radiology nursing throughout the procedure under my direct supervision. CONTRAST:  75 cc Omnipaque  300 FLUOROSCOPY TIME:  24 minutes (4,166 mGy) COMPLICATIONS: None immediate. PROCEDURE: Informed consent was obtained from the patient following explanation of the procedure, risks, benefits and alternatives. The patient understands, agrees and  consents for the procedure. All questions were addressed. A time out was performed prior to the initiation of the procedure. Maximal barrier sterile technique utilized including caps, mask, sterile gowns, sterile gloves, large sterile drape, hand hygiene, and Betadine prep. Attention was initially paid towards placement of a non tunneled central venous catheter. Under direct ultrasound guidance, the left internal jugular vein was accessed with a micropuncture kit. A micropuncture wire was utilized for measurement purposes. Under fluoroscopic guidance, a peel-away sheath was placed and a 15 cm triple-lumen non tunneled central venous catheter was inserted with tip terminating at the level the superior cavoatrial junction. Postprocedural spot fluoroscopic radiographic image was obtained. The central venous catheter was secured at the neck entrance site within interrupted suture. A dressing was applied. _________________________________________________________ The right femoral head was marked fluoroscopically. Under ultrasound guidance, the right common femoral artery was accessed with a micropuncture kit after the overlying soft tissues were anesthetized with 1% lidocaine. An ultrasound image was saved for documentation purposes. The micropuncture sheath was exchanged for a 5 [place] vascular sheath over a Bentson wire. A closure arteriogram was performed through the side of the sheath confirming access within the right common femoral artery. Over a Bentson wire, a Mickelson catheter was advanced to the level of the inferior aspect of the thoracic aorta where it was back bled and flushed. The catheter was then utilized to select the left renal artery and a selective left renal  arteriogram was performed. Next, with the use of a fathom 14 microcatheter, a regular Renegade microcatheter was advanced into a midpole division of the left renal artery. Sub selective arteriogram was performed and the vessel was percutaneously coil embolized with multiple overlapping 2 mm, 3 mm and 4 mm diameter interlock coils. Next, the microcatheter was utilized to an additional midpole division of the left renal artery and a sub selective arteriogram was performed. Next, the vessel was percutaneously coil embolized with multiple overlapping 4 mm, 5 mm and 6 mm interlock coils. The microcatheter was retracted to the level of the main left renal artery and selective left arteriogram was performed. The microcatheter was removed and a completion arteriogram was performed via the [facility] catheter. Next, prolonged efforts were made to cannulate the known tiny accessory left renal artery however this ultimately proved difficult. As such, a flush aortogram was performed demonstrating patency of the accessory left renal artery. Unfortunately, despite prolonged efforts, the tiny accessory left renal artery could not be selectively cannulated, likely secondary to a combination of suspected narrowing involving the vessel's origin (as was demonstrated on preceding CT) as well as the patient's aneurysmal and tortuous abdominal aorta. At this point, the procedure was terminated. All wires, catheters and sheaths were removed patient. Hemostasis was achieved at the right groin access site with deployment of an ExoSeal closure device and manual compression. A dressing applied. The patient tolerated the procedure well without immediate postprocedural complication. FINDINGS: Selective left renal arteriogram demonstrates marked irregularity and contrast extravasation arising from several midpole subsegmental renal arteries. As such, both co-dominant midpole segmental arteries were sub selective and percutaneously coil embolized.  Completion arteriogram demonstrates a technically excellent result without evidence of residual contrast extravasation or vessel irregularity. Completion left renal arteriogram demonstrates patency of several superior pole segmental divisions without additional area contrast extravasation or vessel irregularity. Flush abdominal aortogram demonstrates patency of the known accessory tiny left renal artery. Despite prolonged efforts, the accessory left renal artery could not be selectively cannulated, likely secondary to a combination of  suspected narrowing of the vessel's origin (as was demonstrated on preceding CT), as well as the patient's aneurysmal and tortuous abdominal aorta. IMPRESSION: 1. Technically successful percutaneous coil embolization of two mid pole segmental arteries supplying ill-defined areas of contrast extravasation and vessel irregularity. 2. Attempted, though unsuccessful, cannulation of the known tiny left accessory renal artery likely to secondary to a combination of suspected narrowing of the vessel's origin (as was demonstrated on preceding CT) as well as the patient's aneurysmal and tortuous abdominal aorta. 3. Successful image guided placement of a non tunneled right triple-lumen central venous catheter with tip terminating within the superior cavoatrial junction. The central venous catheter is ready for immediate use. Electronically Signed   By: Sandi Mariscal M.D.   On: 09/15/2019 16:52    Labs:  CBC: Recent Labs    09/15/19 1829 09/15/19 2149 09/16/19 0246 09/16/19 0751  WBC 13.4* 13.7* 14.7* 17.2*  HGB 14.8 15.1 13.4 12.8*  HCT 44.5 44.9 38.6* 38.6*  PLT 207 196 189 200    COAGS: Recent Labs    09/15/19 0922  INR 2.0*    BMP: Recent Labs    09/15/19 0922 09/16/19 0341  NA 137 136  K 4.2 3.6  CL 101 103  CO2 23 21*  GLUCOSE 150* 155*  BUN 24* 29*  CALCIUM 9.1 8.5*  CREATININE 1.46* 1.86*  GFRNONAA 41* 30*  GFRAA 47* 35*    LIVER FUNCTION  TESTS: Recent Labs    09/15/19 0922  BILITOT 1.2  AST 31  ALT 15  ALKPHOS 51  PROT 6.2*  ALBUMIN 3.1*    Assessment and Plan:  Fall with renal laceration.  Post left renal arteriogram and percutaneous coil embolization of two segmental branches supplying ill defined areas of contrast extravasation and vessel irregularity of the mid pole of the left kidney by Dr. Pascal Lux.  Groin ok. Remove dressing tomorrow then ok to shower.  Electronically Signed: Murrell Redden, PA-C 09/16/2019, 10:13 AM    I spent a total of 15 Minutes at the the patient's bedside AND on the patient's hospital floor or unit, greater than 50% of which was counseling/coordinating care for f/u renal embo.

## 2019-09-16 NOTE — Progress Notes (Signed)
Received pt from ED into 3M09.

## 2019-09-17 ENCOUNTER — Inpatient Hospital Stay (HOSPITAL_COMMUNITY): Payer: Medicare HMO

## 2019-09-17 LAB — BASIC METABOLIC PANEL
Anion gap: 11 (ref 5–15)
BUN: 32 mg/dL — ABNORMAL HIGH (ref 8–23)
CO2: 22 mmol/L (ref 22–32)
Calcium: 8.3 mg/dL — ABNORMAL LOW (ref 8.9–10.3)
Chloride: 102 mmol/L (ref 98–111)
Creatinine, Ser: 2.13 mg/dL — ABNORMAL HIGH (ref 0.61–1.24)
GFR calc Af Amer: 30 mL/min — ABNORMAL LOW (ref >60)
GFR calc non Af Amer: 26 mL/min — ABNORMAL LOW (ref >60)
Glucose, Bld: 160 mg/dL — ABNORMAL HIGH (ref 70–99)
Potassium: 2.8 mmol/L — ABNORMAL LOW (ref 3.5–5.1)
Sodium: 135 mmol/L (ref 135–145)

## 2019-09-17 LAB — CBC
HCT: 34.6 % — ABNORMAL LOW (ref 39.0–52.0)
Hemoglobin: 11.6 g/dL — ABNORMAL LOW (ref 13.0–17.0)
MCH: 32.1 pg (ref 26.0–34.0)
MCHC: 33.5 g/dL (ref 30.0–36.0)
MCV: 95.8 fL (ref 80.0–100.0)
Platelets: 161 10*3/uL (ref 150–400)
RBC: 3.61 MIL/uL — ABNORMAL LOW (ref 4.22–5.81)
RDW: 14.7 % (ref 11.5–15.5)
WBC: 19.3 10*3/uL — ABNORMAL HIGH (ref 4.0–10.5)
nRBC: 0 % (ref 0.0–0.2)

## 2019-09-17 LAB — MAGNESIUM: Magnesium: 2 mg/dL (ref 1.7–2.4)

## 2019-09-17 MED ORDER — POTASSIUM CHLORIDE 10 MEQ/100ML IV SOLN
10.0000 meq | INTRAVENOUS | Status: AC
  Administered 2019-09-17 (×6): 10 meq via INTRAVENOUS
  Filled 2019-09-17 (×7): qty 100

## 2019-09-17 MED ORDER — ACETAMINOPHEN 325 MG PO TABS
650.0000 mg | ORAL_TABLET | Freq: Four times a day (QID) | ORAL | Status: DC
  Administered 2019-09-17 – 2019-09-28 (×41): 650 mg via ORAL
  Filled 2019-09-17 (×43): qty 2

## 2019-09-17 MED ORDER — POTASSIUM CHLORIDE CRYS ER 20 MEQ PO TBCR
20.0000 meq | EXTENDED_RELEASE_TABLET | Freq: Two times a day (BID) | ORAL | Status: AC
  Administered 2019-09-17 – 2019-09-18 (×2): 20 meq via ORAL
  Filled 2019-09-17 (×2): qty 1

## 2019-09-17 MED ORDER — IPRATROPIUM-ALBUTEROL 0.5-2.5 (3) MG/3ML IN SOLN
3.0000 mL | Freq: Four times a day (QID) | RESPIRATORY_TRACT | Status: DC | PRN
  Administered 2019-09-17 – 2019-09-19 (×2): 3 mL via RESPIRATORY_TRACT
  Filled 2019-09-17 (×2): qty 3

## 2019-09-17 MED ORDER — METHOCARBAMOL 500 MG PO TABS
500.0000 mg | ORAL_TABLET | Freq: Three times a day (TID) | ORAL | Status: DC
  Administered 2019-09-17 – 2019-09-23 (×20): 500 mg via ORAL
  Filled 2019-09-17 (×20): qty 1

## 2019-09-17 NOTE — Evaluation (Signed)
Physical Therapy Evaluation Patient Details Name: [name] [name] MRN: [id number] DOB: 1923-05-16 Today's Date: 09/17/2019   History of Present Illness  Patient is a 85 y/o male who presents with left rib fxs 7-11 and left renal laceration s/p IR embolization 10/14 after a fall at home. PMH includes HTN, A-fib and colon ca.  Clinical Impression  Patient presents with generalized weakness, dyspnea on exertion, impaired balance, decreased activity tolerance and impaired mobility s/p above. Pt lives alone and has caregivers who assist PRN at home. Uses RW for ambulation and does his own ADLs per report. Pt reports 3 falls in last 6 months. Today, pt requires Mod A of 2 for bed mobility, standing and taking a few steps to get to chair with use of RW for support. Pt fatigues very quickly. HR up to 135 bpm and SP02 dropped to 87% on 2.5L/min 02. Would benefit from SNF to maximize independence and mobility prior to return home. Will follow acutely.    Follow Up Recommendations SNF    Equipment Recommendations  None recommended by PT    Recommendations for Other Services       Precautions / Restrictions Precautions Precautions: Fall Precaution Comments: watch 02 Restrictions Weight Bearing Restrictions: No      Mobility  Bed Mobility Overal bed mobility: Needs Assistance Bed Mobility: Supine to Sit     Supine to sit: Mod assist;+2 for physical assistance;HOB elevated     General bed mobility comments: Able to bring LEs mostly to EOB, assist with trunk and scooting bottom to get to EOB.  Transfers Overall transfer level: Needs assistance Equipment used: Rolling walker (2 wheeled) Transfers: Sit to/from Stand Sit to Stand: Mod assist;+2 physical assistance;From elevated surface         General transfer comment: Assist to power to standing with cues for hand placement.  Ambulation/Gait Ambulation/Gait assistance: Min assist;+2 safety/equipment Gait Distance (Feet): 4  Feet Assistive device: Rolling walker (2 wheeled) Gait Pattern/deviations: Shuffle;Trunk flexed Gait velocity: decreased   General Gait Details: Able to take a few steps to get to chair with assist for balance, and RW management. 2-3/4 DOE. Sp02 dropped to 87% on 2.5L/min 02. Recovered quickly with cues for pursed lip breathing. HR up to 135 bpm.  Stairs            Wheelchair Mobility    Modified Rankin (Stroke Patients Only)       Balance Overall balance assessment: Needs assistance Sitting-balance support: Feet supported;No upper extremity supported Sitting balance-Leahy Scale: Fair Sitting balance - Comments: Able to sit EOB leaning on thighs, good balance.   Standing balance support: During functional activity Standing balance-Leahy Scale: Poor Standing balance comment: Use of BUEs for support and physical assist, cues for upright.                             Pertinent Vitals/Pain Pain Assessment: Faces Faces Pain Scale: Hurts little more Pain Location: left side Pain Descriptors / Indicators: Sore;Aching;Guarding Pain Intervention(s): Repositioned;Monitored during session    Home Living Family/patient expects to be discharged to:: Skilled nursing facility Living Arrangements: Alone Available Help at Discharge: Friend(s);Available PRN/intermittently Type of Home: House Home Access: Level entry     Home Layout: Two level;Able to live on main level with bedroom/bathroom Home Equipment: Gilford Rile - 2 wheels;Shower seat - built in;Cane - single point;Wheelchair - manual      Prior Function Level of Independence: Needs assistance   Gait /  Transfers Assistance Needed: Uses RW for ambulation, reports difficulty with this a few days prior to fall. "I can't even walkt o my mail box due to weakness."  ADL's / Homemaking Assistance Needed: Does his own ADLs. Reports he has 2 caregivers that call everyday and one comes twice/week and the other comes when he  calls. They assist with shopping and IADLs.  Comments: Reports 3 falls in last 6 months.     Hand Dominance        Extremity/Trunk Assessment   Upper Extremity Assessment Upper Extremity Assessment: Defer to OT evaluation    Lower Extremity Assessment Lower Extremity Assessment: Generalized weakness    Cervical / Trunk Assessment Cervical / Trunk Assessment: Kyphotic  Communication   Communication: HOH  Cognition Arousal/Alertness: Awake/alert Behavior During Therapy: WFL for tasks assessed/performed Overall Cognitive Status: Within Functional Limits for tasks assessed                                 General Comments: for basic mobility tasks.      General Comments      Exercises     Assessment/Plan    PT Assessment Patient needs continued PT services  PT Problem List Decreased strength;Decreased mobility;Decreased balance;Cardiopulmonary status limiting activity;Decreased activity tolerance;Pain;Decreased safety awareness       PT Treatment Interventions Therapeutic activities;Gait training;Therapeutic exercise;Patient/family education;Balance training;Functional mobility training;DME instruction    PT Goals (Current goals can be found in the Care Plan section)  Acute Rehab PT Goals Patient Stated Goal: to get better and go home PT Goal Formulation: With patient Time For Goal Achievement: 10/01/19 Potential to Achieve Goals: Fair    Frequency Min 3X/week   Barriers to discharge Decreased caregiver support      Co-evaluation               AM-PAC PT "6 Clicks" Mobility  Outcome Measure Help needed turning from your back to your side while in a flat bed without using bedrails?: A Little Help needed moving from lying on your back to sitting on the side of a flat bed without using bedrails?: A Lot Help needed moving to and from a bed to a chair (including a wheelchair)?: A Lot Help needed standing up from a chair using your arms (e.g.,  wheelchair or bedside chair)?: A Lot Help needed to walk in hospital room?: A Lot Help needed climbing 3-5 steps with a railing? : Total 6 Click Score: 12    End of Session Equipment Utilized During Treatment: Oxygen;Gait belt Activity Tolerance: Patient limited by fatigue;Patient tolerated treatment well Patient left: in chair;with call bell/phone within reach;with chair alarm set Nurse Communication: Mobility status PT Visit Diagnosis: Muscle weakness (generalized) (M62.81);Difficulty in walking, not elsewhere classified (R26.2);Unsteadiness on feet (R26.81);Repeated falls (R29.6)    Time: [id number] PT Time Calculation (min) (ACUTE ONLY): 16 min   Charges:   PT Evaluation $PT Eval Moderate Complexity: 1 Mod          Wray Kearns, PT, DPT Acute Rehabilitation Services Pager [phone] Office [place] 09/17/2019, 4:17 PM

## 2019-09-17 NOTE — Progress Notes (Signed)
Transferred -in from MICU by bed awake and alert. 

## 2019-09-17 NOTE — Progress Notes (Addendum)
[facility] Progress Note     Subjective: CC: some SOB this AM Patient with increased SOB this AM, better now. Denies pain. Did not like flutter valve because it made him cough which caused him to be incontinent of stool. IS was in room but unopened. He was able to pull 500 for me. He lives at home alone, has a male friend who checks on him and helps him with groceries and errands.   Objective: Vital signs in last 24 hours: Temp:  [97.6 F (36.4 C)-98.6 F (37 C)] 97.6 F (36.4 C) (10/16 0821) Pulse Rate:  [60-137] 100 (10/16 0900) Resp:  [19-39] 30 (10/16 0900) BP: (90-147)/(63-117) 122/78 (10/16 0900) SpO2:  [81 %-100 %] 81 % (10/16 0900) Last BM Date: 09/16/19  Intake/Output from previous day: 10/15 0701 - 10/16 0700 In: 1725 [I.V.:1725] Out: -  Intake/Output this shift: Total I/O In: 75 [I.V.:75] Out: 450 [Urine:450]  PE: Gen:  Alert, NAD, pleasant Card:  regular rate, irregular rhythm with occasional PVCs, pedal pulses 2+ BL Pulm:  Normal effort, clear to auscultation bilaterally, pulled 500 on IS Abd: Soft, non-tender, non-distended, +BS Skin: warm and dry, no rashes  Neuro: A&Ox4, speech clear  Lab Results:  Recent Labs    09/16/19 1437 09/16/19 2015  WBC 17.5* 17.5*  HGB 11.9* 11.7*  HCT 35.5* 35.3*  PLT 181 177   BMET Recent Labs    09/16/19 0341 09/17/19 0518  NA 136 135  K 3.6 2.8*  CL 103 102  CO2 21* 22  GLUCOSE 155* 160*  BUN 29* 32*  CREATININE 1.86* 2.13*  CALCIUM 8.5* 8.3*   PT/INR Recent Labs    09/15/19 0922  LABPROT 22.8*  INR 2.0*   CMP     Component Value Date/Time   NA 135 09/17/2019 0518   K 2.8 (L) 09/17/2019 0518   CL 102 09/17/2019 0518   CO2 22 09/17/2019 0518   GLUCOSE 160 (H) 09/17/2019 0518   BUN 32 (H) 09/17/2019 0518   CREATININE 2.13 (H) 09/17/2019 0518   CALCIUM 8.3 (L) 09/17/2019 0518   PROT 6.2 (L) 09/15/2019 0922   ALBUMIN 3.1 (L) 09/15/2019 0922   AST 31 09/15/2019 0922   ALT 15  09/15/2019 0922   ALKPHOS 51 09/15/2019 0922   BILITOT 1.2 09/15/2019 0922   GFRNONAA 26 (L) 09/17/2019 0518   GFRAA 30 (L) 09/17/2019 0518   Lipase  No results found for: LIPASE     Studies/Results: Ct Head Wo Contrast  Result Date: 09/15/2019 CLINICAL DATA:  Trauma, fall EXAM: CT HEAD WITHOUT CONTRAST CT CERVICAL SPINE WITHOUT CONTRAST TECHNIQUE: Multidetector CT imaging of the head and cervical spine was performed following the standard protocol without intravenous contrast. Multiplanar CT image reconstructions of the cervical spine were also generated. COMPARISON:  None. FINDINGS: CT HEAD FINDINGS Brain: No evidence of acute infarction, hemorrhage, hydrocephalus, extra-axial collection or mass lesion/mass effect. This periventricular and deep white matter hypodensity with global volume loss. Vascular: No hyperdense vessel or unexpected calcification. Skull: Normal. Negative for fracture or focal lesion. Sinuses/Orbits: No acute finding. Other: None. CT CERVICAL SPINE FINDINGS Alignment: Degenerative straightening of the normal cervical lordosis. Skull base and vertebrae: No acute fracture. No primary bone lesion or focal pathologic process. Soft tissues and spinal canal: No prevertebral fluid or swelling. No visible canal hematoma. Disc levels: Severe multilevel disc degenerative disease and partial ankylosis of the cervical spine. Upper chest: Negative. Other: Aortic atherosclerosis. IMPRESSION: 1. No acute intracranial pathology. Small-vessel  white matter disease and global volume loss in keeping with advanced patient age. 2. No fracture or static subluxation of the cervical spine. Severe multilevel disc degenerative disease. 3.  Aortic Atherosclerosis (ICD10-I70.0). Electronically Signed   By: Eddie Candle M.D.   On: 09/15/2019 12:20   Ct Chest W Contrast  Result Date: 09/15/2019 CLINICAL DATA:  Chest trauma, fall, pain in ribs and back, history of colon cancer EXAM: CT CHEST, ABDOMEN,  AND PELVIS WITH CONTRAST TECHNIQUE: Multidetector CT imaging of the chest, abdomen and pelvis was performed following the standard protocol during bolus administration of intravenous contrast. CONTRAST:  32m OMNIPAQUE IOHEXOL 300 MG/ML  SOLN COMPARISON:  None. FINDINGS: CT CHEST FINDINGS Cardiovascular: Aortic atherosclerosis. Cardiomegaly. Three-vessel coronary artery calcifications. No pericardial effusion. Mediastinum/Nodes: No enlarged mediastinal, hilar, or axillary lymph nodes. Thyroid gland, trachea, and esophagus demonstrate no significant findings. Lungs/Pleura: Lungs are clear. Large left-sided diaphragmatic eventration. Trace left pleural effusion. Musculoskeletal: No chest wall mass or suspicious bone lesions identified. There are minimally displaced fractures of the lateral and posterior left seventh through eleventh ribs. CT ABDOMEN PELVIS FINDINGS Hepatobiliary: No solid liver abnormality is seen. Gallstones. No gallbladder wall thickening, or biliary dilatation. Pancreas: Unremarkable. No pancreatic ductal dilatation or surrounding inflammatory changes. Spleen: Normal in size without significant abnormality. Adrenals/Urinary Tract: Adrenal glands are unremarkable. There is a laceration and large hematoma of the inferior pole of the left kidney with multiple internal foci of contrast attenuation concerning for active extravasation (series 3, image 63, 69). There is extensive fluid and contrast in the perirenal fascia and retroperitoneal soft tissues overlying the psoas. Bladder is unremarkable. Stomach/Bowel: Stomach is within normal limits. No evidence of bowel wall thickening, distention, or inflammatory changes. Evidence of prior sigmoid resection and reanastomosis Vascular/Lymphatic: Aortic atherosclerosis. Infrarenal abdominal aortic aneurysm measuring 4.2 x 4.1 cm. No enlarged abdominal or pelvic lymph nodes. Reproductive: No mass or other abnormality. Other: There is a midline ventral hernia  containing nonobstructed loops of small bowel, hernia sac measuring 11.2 x 3.7 cm and neck measuring 4.8 cm (series 3, image 93, series 7, image 112). No abdominopelvic ascites. Musculoskeletal: No acute or significant osseous findings. IMPRESSION: 1. There is a laceration and large hematoma of the inferior pole of the left kidney with multiple internal foci of contrast attenuation concerning for active extravasation (series 3, image 63, 69). There is extensive fluid and contrast in the perirenal fascia and retroperitoneal soft tissues overlying the psoas. 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 2. There are minimally displaced fractures of the overlying lateral and posterior left seventh through eleventh ribs. 3.  Trace left pleural effusion. 4. 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 5. There is a midline ventral hernia containing nonobstructed loops of small bowel, hernia sac measuring 11.2 x 3.7 cm and neck measuring 4.8 cm (series 3, image 93, series 7, image 112). 6. Infrarenal abdominal aortic aneurysm measuring 4.2 x 4.1 cm. Aortic Atherosclerosis (ICD10-I70.0). 7.  Cardiomegaly and coronary artery disease. 8.  Evidence of prior sigmoid colon resection and reanastomosis. 9.  Cholelithiasis. These results were called by telephone at the time of interpretation on 09/15/2019 at 12:44 pm to provider DVeryl Speak, who verbally acknowledged these results. Electronically Signed   By: AEddie CandleM.D.   On: 09/15/2019 12:49   Ct Cervical Spine Wo Contrast  Result Date: 09/15/2019 CLINICAL DATA:  Trauma,  fall EXAM: CT HEAD WITHOUT CONTRAST CT CERVICAL SPINE WITHOUT CONTRAST TECHNIQUE: Multidetector CT imaging of the head and cervical spine was performed following the standard protocol without intravenous contrast. Multiplanar CT image reconstructions of the  cervical spine were also generated. COMPARISON:  None. FINDINGS: CT HEAD FINDINGS Brain: No evidence of acute infarction, hemorrhage, hydrocephalus, extra-axial collection or mass lesion/mass effect. This periventricular and deep white matter hypodensity with global volume loss. Vascular: No hyperdense vessel or unexpected calcification. Skull: Normal. Negative for fracture or focal lesion. Sinuses/Orbits: No acute finding. Other: None. CT CERVICAL SPINE FINDINGS Alignment: Degenerative straightening of the normal cervical lordosis. Skull base and vertebrae: No acute fracture. No primary bone lesion or focal pathologic process. Soft tissues and spinal canal: No prevertebral fluid or swelling. No visible canal hematoma. Disc levels: Severe multilevel disc degenerative disease and partial ankylosis of the cervical spine. Upper chest: Negative. Other: Aortic atherosclerosis. IMPRESSION: 1. No acute intracranial pathology. Small-vessel white matter disease and global volume loss in keeping with advanced patient age. 2. No fracture or static subluxation of the cervical spine. Severe multilevel disc degenerative disease. 3.  Aortic Atherosclerosis (ICD10-I70.0). Electronically Signed   By: Eddie Candle M.D.   On: 09/15/2019 12:20   Ct Abdomen Pelvis W Contrast  Result Date: 09/15/2019 CLINICAL DATA:  Chest trauma, fall, pain in ribs and back, history of colon cancer EXAM: CT CHEST, ABDOMEN, AND PELVIS WITH CONTRAST TECHNIQUE: Multidetector CT imaging of the chest, abdomen and pelvis was performed following the standard protocol during bolus administration of intravenous contrast. CONTRAST:  60m OMNIPAQUE IOHEXOL 300 MG/ML  SOLN COMPARISON:  None. FINDINGS: CT CHEST FINDINGS Cardiovascular: Aortic atherosclerosis. Cardiomegaly. Three-vessel coronary artery calcifications. No pericardial effusion. Mediastinum/Nodes: No enlarged mediastinal, hilar, or axillary lymph nodes. Thyroid gland, trachea, and esophagus  demonstrate no significant findings. Lungs/Pleura: Lungs are clear. Large left-sided diaphragmatic eventration. Trace left pleural effusion. Musculoskeletal: No chest wall mass or suspicious bone lesions identified. There are minimally displaced fractures of the lateral and posterior left seventh through eleventh ribs. CT ABDOMEN PELVIS FINDINGS Hepatobiliary: No solid liver abnormality is seen. Gallstones. No gallbladder wall thickening, or biliary dilatation. Pancreas: Unremarkable. No pancreatic ductal dilatation or surrounding inflammatory changes. Spleen: Normal in size without significant abnormality. Adrenals/Urinary Tract: Adrenal glands are unremarkable. There is a laceration and large hematoma of the inferior pole of the left kidney with multiple internal foci of contrast attenuation concerning for active extravasation (series 3, image 63, 69). There is extensive fluid and contrast in the perirenal fascia and retroperitoneal soft tissues overlying the psoas. Bladder is unremarkable. Stomach/Bowel: Stomach is within normal limits. No evidence of bowel wall thickening, distention, or inflammatory changes. Evidence of prior sigmoid resection and reanastomosis Vascular/Lymphatic: Aortic atherosclerosis. Infrarenal abdominal aortic aneurysm measuring 4.2 x 4.1 cm. No enlarged abdominal or pelvic lymph nodes. Reproductive: No mass or other abnormality. Other: There is a midline ventral hernia containing nonobstructed loops of small bowel, hernia sac measuring 11.2 x 3.7 cm and neck measuring 4.8 cm (series 3, image 93, series 7, image 112). No abdominopelvic ascites. Musculoskeletal: No acute or significant osseous findings. IMPRESSION: 1. There is a laceration and large hematoma of the inferior pole of the left kidney with multiple internal foci of contrast attenuation concerning for active extravasation (series 3, image 63, 69). There is extensive fluid and contrast in the perirenal fascia and retroperitoneal  soft tissues overlying the psoas. 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 2. There are minimally displaced  fractures of the overlying lateral and posterior left seventh through eleventh ribs. 3.  Trace left pleural effusion. 4. 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 5. There is a midline ventral hernia containing nonobstructed loops of small bowel, hernia sac measuring 11.2 x 3.7 cm and neck measuring 4.8 cm (series 3, image 93, series 7, image 112). 6. Infrarenal abdominal aortic aneurysm measuring 4.2 x 4.1 cm. Aortic Atherosclerosis (ICD10-I70.0). 7.  Cardiomegaly and coronary artery disease. 8.  Evidence of prior sigmoid colon resection and reanastomosis. 9.  Cholelithiasis. These results were called by telephone at the time of interpretation on 09/15/2019 at 12:44 pm to provider Veryl Speak , who verbally acknowledged these results. Electronically Signed   By: Eddie Candle M.D.   On: 09/15/2019 12:49   Ir Angiogram Renal Uni Selective  Result Date: 09/15/2019 INDICATION: Fall on anticoagulation, now with left-sided renal fractures and left renal laceration. Request made for left renal arteriogram and potential embolization. Request also made for placement of a image guided central venous catheter for durable intravenous access. EXAM: 1. ULTRASOUND GUIDANCE FOR VENOUS ACCESS 2. FLUOROSCOPIC GUIDED PLACEMENT OF A NON TUNNELED TRIPLE-LUMEN CENTRAL VENOUS CATHETER. 3. ULTRASOUND GUIDANCE FOR ARTERIAL ACCESS 4. FLUSH ABDOMINAL AORTOGRAM 5. SELECTIVE LEFT RENAL ARTERIOGRAM 6. SUB SELECTIVE LEFT ARTERIOGRAM AND PERCUTANEOUS COIL EMBOLIZATION COMPARISON:  CT the chest, abdomen and pelvis - earlier same day MEDICATIONS: NONE ANESTHESIA/SEDATION: Moderate (conscious) sedation was employed during this procedure. A total of Versed 2 mg and Fentanyl 100 mcg was  administered intravenously. Moderate Sedation Time: 85 minutes. The patient's level of consciousness and vital signs were monitored continuously by radiology nursing throughout the procedure under my direct supervision. CONTRAST:  75 cc Omnipaque 300 FLUOROSCOPY TIME:  24 minutes (6,144 mGy) COMPLICATIONS: None immediate. PROCEDURE: Informed consent was obtained from the patient following explanation of the procedure, risks, benefits and alternatives. The patient understands, agrees and consents for the procedure. All questions were addressed. A time out was performed prior to the initiation of the procedure. Maximal barrier sterile technique utilized including caps, mask, sterile gowns, sterile gloves, large sterile drape, hand hygiene, and Betadine prep. Attention was initially paid towards placement of a non tunneled central venous catheter. Under direct ultrasound guidance, the left internal jugular vein was accessed with a micropuncture kit. A micropuncture wire was utilized for measurement purposes. Under fluoroscopic guidance, a peel-away sheath was placed and a 15 cm triple-lumen non tunneled central venous catheter was inserted with tip terminating at the level the superior cavoatrial junction. Postprocedural spot fluoroscopic radiographic image was obtained. The central venous catheter was secured at the neck entrance site within interrupted suture. A dressing was applied. _________________________________________________________ The right femoral head was marked fluoroscopically. Under ultrasound guidance, the right common femoral artery was accessed with a micropuncture kit after the overlying soft tissues were anesthetized with 1% lidocaine. An ultrasound image was saved for documentation purposes. The micropuncture sheath was exchanged for a 5 [place] vascular sheath over a Bentson wire. A closure arteriogram was performed through the side of the sheath confirming access within the right common femoral  artery. Over a Bentson wire, a Mickelson catheter was advanced to the level of the inferior aspect of the thoracic aorta where it was back bled and flushed. The catheter was then utilized to select the left renal artery and a selective left renal arteriogram was performed. Next, with the use of a  fathom 14 microcatheter, a regular Renegade microcatheter was advanced into a midpole division of the left renal artery. Sub selective arteriogram was performed and the vessel was percutaneously coil embolized with multiple overlapping 2 mm, 3 mm and 4 mm diameter interlock coils. Next, the microcatheter was utilized to an additional midpole division of the left renal artery and a sub selective arteriogram was performed. Next, the vessel was percutaneously coil embolized with multiple overlapping 4 mm, 5 mm and 6 mm interlock coils. The microcatheter was retracted to the level of the main left renal artery and selective left arteriogram was performed. The microcatheter was removed and a completion arteriogram was performed via the [facility] catheter. Next, prolonged efforts were made to cannulate the known tiny accessory left renal artery however this ultimately proved difficult. As such, a flush aortogram was performed demonstrating patency of the accessory left renal artery. Unfortunately, despite prolonged efforts, the tiny accessory left renal artery could not be selectively cannulated, likely secondary to a combination of suspected narrowing involving the vessel's origin (as was demonstrated on preceding CT) as well as the patient's aneurysmal and tortuous abdominal aorta. At this point, the procedure was terminated. All wires, catheters and sheaths were removed patient. Hemostasis was achieved at the right groin access site with deployment of an ExoSeal closure device and manual compression. A dressing applied. The patient tolerated the procedure well without immediate postprocedural complication. FINDINGS: Selective  left renal arteriogram demonstrates marked irregularity and contrast extravasation arising from several midpole subsegmental renal arteries. As such, both co-dominant midpole segmental arteries were sub selective and percutaneously coil embolized. Completion arteriogram demonstrates a technically excellent result without evidence of residual contrast extravasation or vessel irregularity. Completion left renal arteriogram demonstrates patency of several superior pole segmental divisions without additional area contrast extravasation or vessel irregularity. Flush abdominal aortogram demonstrates patency of the known accessory tiny left renal artery. Despite prolonged efforts, the accessory left renal artery could not be selectively cannulated, likely secondary to a combination of suspected narrowing of the vessel's origin (as was demonstrated on preceding CT), as well as the patient's aneurysmal and tortuous abdominal aorta. IMPRESSION: 1. Technically successful percutaneous coil embolization of two mid pole segmental arteries supplying ill-defined areas of contrast extravasation and vessel irregularity. 2. Attempted, though unsuccessful, cannulation of the known tiny left accessory renal artery likely to secondary to a combination of suspected narrowing of the vessel's origin (as was demonstrated on preceding CT) as well as the patient's aneurysmal and tortuous abdominal aorta. 3. Successful image guided placement of a non tunneled right triple-lumen central venous catheter with tip terminating within the superior cavoatrial junction. The central venous catheter is ready for immediate use. Electronically Signed   By: Sandi Mariscal M.D.   On: 09/15/2019 16:52   Ir Fluoro Guide Cv Line Right  Result Date: 09/15/2019 INDICATION: Fall on anticoagulation, now with left-sided renal fractures and left renal laceration. Request made for left renal arteriogram and potential embolization. Request also made for placement of  a image guided central venous catheter for durable intravenous access. EXAM: 1. ULTRASOUND GUIDANCE FOR VENOUS ACCESS 2. FLUOROSCOPIC GUIDED PLACEMENT OF A NON TUNNELED TRIPLE-LUMEN CENTRAL VENOUS CATHETER. 3. ULTRASOUND GUIDANCE FOR ARTERIAL ACCESS 4. FLUSH ABDOMINAL AORTOGRAM 5. SELECTIVE LEFT RENAL ARTERIOGRAM 6. SUB SELECTIVE LEFT ARTERIOGRAM AND PERCUTANEOUS COIL EMBOLIZATION COMPARISON:  CT the chest, abdomen and pelvis - earlier same day MEDICATIONS: NONE ANESTHESIA/SEDATION: Moderate (conscious) sedation was employed during this procedure. A total of Versed 2 mg and Fentanyl 100 mcg  was administered intravenously. Moderate Sedation Time: 85 minutes. The patient's level of consciousness and vital signs were monitored continuously by radiology nursing throughout the procedure under my direct supervision. CONTRAST:  75 cc Omnipaque 300 FLUOROSCOPY TIME:  24 minutes (6,979 mGy) COMPLICATIONS: None immediate. PROCEDURE: Informed consent was obtained from the patient following explanation of the procedure, risks, benefits and alternatives. The patient understands, agrees and consents for the procedure. All questions were addressed. A time out was performed prior to the initiation of the procedure. Maximal barrier sterile technique utilized including caps, mask, sterile gowns, sterile gloves, large sterile drape, hand hygiene, and Betadine prep. Attention was initially paid towards placement of a non tunneled central venous catheter. Under direct ultrasound guidance, the left internal jugular vein was accessed with a micropuncture kit. A micropuncture wire was utilized for measurement purposes. Under fluoroscopic guidance, a peel-away sheath was placed and a 15 cm triple-lumen non tunneled central venous catheter was inserted with tip terminating at the level the superior cavoatrial junction. Postprocedural spot fluoroscopic radiographic image was obtained. The central venous catheter was secured at the neck  entrance site within interrupted suture. A dressing was applied. _________________________________________________________ The right femoral head was marked fluoroscopically. Under ultrasound guidance, the right common femoral artery was accessed with a micropuncture kit after the overlying soft tissues were anesthetized with 1% lidocaine. An ultrasound image was saved for documentation purposes. The micropuncture sheath was exchanged for a 5 [place] vascular sheath over a Bentson wire. A closure arteriogram was performed through the side of the sheath confirming access within the right common femoral artery. Over a Bentson wire, a Mickelson catheter was advanced to the level of the inferior aspect of the thoracic aorta where it was back bled and flushed. The catheter was then utilized to select the left renal artery and a selective left renal arteriogram was performed. Next, with the use of a fathom 14 microcatheter, a regular Renegade microcatheter was advanced into a midpole division of the left renal artery. Sub selective arteriogram was performed and the vessel was percutaneously coil embolized with multiple overlapping 2 mm, 3 mm and 4 mm diameter interlock coils. Next, the microcatheter was utilized to an additional midpole division of the left renal artery and a sub selective arteriogram was performed. Next, the vessel was percutaneously coil embolized with multiple overlapping 4 mm, 5 mm and 6 mm interlock coils. The microcatheter was retracted to the level of the main left renal artery and selective left arteriogram was performed. The microcatheter was removed and a completion arteriogram was performed via the [facility] catheter. Next, prolonged efforts were made to cannulate the known tiny accessory left renal artery however this ultimately proved difficult. As such, a flush aortogram was performed demonstrating patency of the accessory left renal artery. Unfortunately, despite prolonged efforts, the tiny  accessory left renal artery could not be selectively cannulated, likely secondary to a combination of suspected narrowing involving the vessel's origin (as was demonstrated on preceding CT) as well as the patient's aneurysmal and tortuous abdominal aorta. At this point, the procedure was terminated. All wires, catheters and sheaths were removed patient. Hemostasis was achieved at the right groin access site with deployment of an ExoSeal closure device and manual compression. A dressing applied. The patient tolerated the procedure well without immediate postprocedural complication. FINDINGS: Selective left renal arteriogram demonstrates marked irregularity and contrast extravasation arising from several midpole subsegmental renal arteries. As such, both co-dominant midpole segmental arteries were sub selective and percutaneously coil embolized. Completion  arteriogram demonstrates a technically excellent result without evidence of residual contrast extravasation or vessel irregularity. Completion left renal arteriogram demonstrates patency of several superior pole segmental divisions without additional area contrast extravasation or vessel irregularity. Flush abdominal aortogram demonstrates patency of the known accessory tiny left renal artery. Despite prolonged efforts, the accessory left renal artery could not be selectively cannulated, likely secondary to a combination of suspected narrowing of the vessel's origin (as was demonstrated on preceding CT), as well as the patient's aneurysmal and tortuous abdominal aorta. IMPRESSION: 1. Technically successful percutaneous coil embolization of two mid pole segmental arteries supplying ill-defined areas of contrast extravasation and vessel irregularity. 2. Attempted, though unsuccessful, cannulation of the known tiny left accessory renal artery likely to secondary to a combination of suspected narrowing of the vessel's origin (as was demonstrated on preceding CT) as well  as the patient's aneurysmal and tortuous abdominal aorta. 3. Successful image guided placement of a non tunneled right triple-lumen central venous catheter with tip terminating within the superior cavoatrial junction. The central venous catheter is ready for immediate use. Electronically Signed   By: Sandi Mariscal M.D.   On: 09/15/2019 16:52   Ir US Guide Vasc Access Right  Result Date: 09/15/2019 INDICATION: Fall on anticoagulation, now with left-sided renal fractures and left renal laceration. Request made for left renal arteriogram and potential embolization. Request also made for placement of a image guided central venous catheter for durable intravenous access. EXAM: 1. ULTRASOUND GUIDANCE FOR VENOUS ACCESS 2. FLUOROSCOPIC GUIDED PLACEMENT OF A NON TUNNELED TRIPLE-LUMEN CENTRAL VENOUS CATHETER. 3. ULTRASOUND GUIDANCE FOR ARTERIAL ACCESS 4. FLUSH ABDOMINAL AORTOGRAM 5. SELECTIVE LEFT RENAL ARTERIOGRAM 6. SUB SELECTIVE LEFT ARTERIOGRAM AND PERCUTANEOUS COIL EMBOLIZATION COMPARISON:  CT the chest, abdomen and pelvis - earlier same day MEDICATIONS: NONE ANESTHESIA/SEDATION: Moderate (conscious) sedation was employed during this procedure. A total of Versed 2 mg and Fentanyl 100 mcg was administered intravenously. Moderate Sedation Time: 85 minutes. The patient's level of consciousness and vital signs were monitored continuously by radiology nursing throughout the procedure under my direct supervision. CONTRAST:  75 cc Omnipaque 300 FLUOROSCOPY TIME:  24 minutes (2,297 mGy) COMPLICATIONS: None immediate. PROCEDURE: Informed consent was obtained from the patient following explanation of the procedure, risks, benefits and alternatives. The patient understands, agrees and consents for the procedure. All questions were addressed. A time out was performed prior to the initiation of the procedure. Maximal barrier sterile technique utilized including caps, mask, sterile gowns, sterile gloves, large sterile drape, hand  hygiene, and Betadine prep. Attention was initially paid towards placement of a non tunneled central venous catheter. Under direct ultrasound guidance, the left internal jugular vein was accessed with a micropuncture kit. A micropuncture wire was utilized for measurement purposes. Under fluoroscopic guidance, a peel-away sheath was placed and a 15 cm triple-lumen non tunneled central venous catheter was inserted with tip terminating at the level the superior cavoatrial junction. Postprocedural spot fluoroscopic radiographic image was obtained. The central venous catheter was secured at the neck entrance site within interrupted suture. A dressing was applied. _________________________________________________________ The right femoral head was marked fluoroscopically. Under ultrasound guidance, the right common femoral artery was accessed with a micropuncture kit after the overlying soft tissues were anesthetized with 1% lidocaine. An ultrasound image was saved for documentation purposes. The micropuncture sheath was exchanged for a 5 [place] vascular sheath over a Bentson wire. A closure arteriogram was performed through the side of the sheath confirming access within the right common femoral artery. Over a  Bentson wire, a Mickelson catheter was advanced to the level of the inferior aspect of the thoracic aorta where it was back bled and flushed. The catheter was then utilized to select the left renal artery and a selective left renal arteriogram was performed. Next, with the use of a fathom 14 microcatheter, a regular Renegade microcatheter was advanced into a midpole division of the left renal artery. Sub selective arteriogram was performed and the vessel was percutaneously coil embolized with multiple overlapping 2 mm, 3 mm and 4 mm diameter interlock coils. Next, the microcatheter was utilized to an additional midpole division of the left renal artery and a sub selective arteriogram was performed. Next, the vessel  was percutaneously coil embolized with multiple overlapping 4 mm, 5 mm and 6 mm interlock coils. The microcatheter was retracted to the level of the main left renal artery and selective left arteriogram was performed. The microcatheter was removed and a completion arteriogram was performed via the [facility] catheter. Next, prolonged efforts were made to cannulate the known tiny accessory left renal artery however this ultimately proved difficult. As such, a flush aortogram was performed demonstrating patency of the accessory left renal artery. Unfortunately, despite prolonged efforts, the tiny accessory left renal artery could not be selectively cannulated, likely secondary to a combination of suspected narrowing involving the vessel's origin (as was demonstrated on preceding CT) as well as the patient's aneurysmal and tortuous abdominal aorta. At this point, the procedure was terminated. All wires, catheters and sheaths were removed patient. Hemostasis was achieved at the right groin access site with deployment of an ExoSeal closure device and manual compression. A dressing applied. The patient tolerated the procedure well without immediate postprocedural complication. FINDINGS: Selective left renal arteriogram demonstrates marked irregularity and contrast extravasation arising from several midpole subsegmental renal arteries. As such, both co-dominant midpole segmental arteries were sub selective and percutaneously coil embolized. Completion arteriogram demonstrates a technically excellent result without evidence of residual contrast extravasation or vessel irregularity. Completion left renal arteriogram demonstrates patency of several superior pole segmental divisions without additional area contrast extravasation or vessel irregularity. Flush abdominal aortogram demonstrates patency of the known accessory tiny left renal artery. Despite prolonged efforts, the accessory left renal artery could not be selectively  cannulated, likely secondary to a combination of suspected narrowing of the vessel's origin (as was demonstrated on preceding CT), as well as the patient's aneurysmal and tortuous abdominal aorta. IMPRESSION: 1. Technically successful percutaneous coil embolization of two mid pole segmental arteries supplying ill-defined areas of contrast extravasation and vessel irregularity. 2. Attempted, though unsuccessful, cannulation of the known tiny left accessory renal artery likely to secondary to a combination of suspected narrowing of the vessel's origin (as was demonstrated on preceding CT) as well as the patient's aneurysmal and tortuous abdominal aorta. 3. Successful image guided placement of a non tunneled right triple-lumen central venous catheter with tip terminating within the superior cavoatrial junction. The central venous catheter is ready for immediate use. Electronically Signed   By: Sandi Mariscal M.D.   On: 09/15/2019 16:52   Ir US Guide Vasc Access Right  Result Date: 09/15/2019 INDICATION: Fall on anticoagulation, now with left-sided renal fractures and left renal laceration. Request made for left renal arteriogram and potential embolization. Request also made for placement of a image guided central venous catheter for durable intravenous access. EXAM: 1. ULTRASOUND GUIDANCE FOR VENOUS ACCESS 2. FLUOROSCOPIC GUIDED PLACEMENT OF A NON TUNNELED TRIPLE-LUMEN CENTRAL VENOUS CATHETER. 3. ULTRASOUND GUIDANCE FOR ARTERIAL ACCESS 4.  FLUSH ABDOMINAL AORTOGRAM 5. SELECTIVE LEFT RENAL ARTERIOGRAM 6. SUB SELECTIVE LEFT ARTERIOGRAM AND PERCUTANEOUS COIL EMBOLIZATION COMPARISON:  CT the chest, abdomen and pelvis - earlier same day MEDICATIONS: NONE ANESTHESIA/SEDATION: Moderate (conscious) sedation was employed during this procedure. A total of Versed 2 mg and Fentanyl 100 mcg was administered intravenously. Moderate Sedation Time: 85 minutes. The patient's level of consciousness and vital signs were monitored  continuously by radiology nursing throughout the procedure under my direct supervision. CONTRAST:  75 cc Omnipaque 300 FLUOROSCOPY TIME:  24 minutes (2,542 mGy) COMPLICATIONS: None immediate. PROCEDURE: Informed consent was obtained from the patient following explanation of the procedure, risks, benefits and alternatives. The patient understands, agrees and consents for the procedure. All questions were addressed. A time out was performed prior to the initiation of the procedure. Maximal barrier sterile technique utilized including caps, mask, sterile gowns, sterile gloves, large sterile drape, hand hygiene, and Betadine prep. Attention was initially paid towards placement of a non tunneled central venous catheter. Under direct ultrasound guidance, the left internal jugular vein was accessed with a micropuncture kit. A micropuncture wire was utilized for measurement purposes. Under fluoroscopic guidance, a peel-away sheath was placed and a 15 cm triple-lumen non tunneled central venous catheter was inserted with tip terminating at the level the superior cavoatrial junction. Postprocedural spot fluoroscopic radiographic image was obtained. The central venous catheter was secured at the neck entrance site within interrupted suture. A dressing was applied. _________________________________________________________ The right femoral head was marked fluoroscopically. Under ultrasound guidance, the right common femoral artery was accessed with a micropuncture kit after the overlying soft tissues were anesthetized with 1% lidocaine. An ultrasound image was saved for documentation purposes. The micropuncture sheath was exchanged for a 5 [place] vascular sheath over a Bentson wire. A closure arteriogram was performed through the side of the sheath confirming access within the right common femoral artery. Over a Bentson wire, a Mickelson catheter was advanced to the level of the inferior aspect of the thoracic aorta where it  was back bled and flushed. The catheter was then utilized to select the left renal artery and a selective left renal arteriogram was performed. Next, with the use of a fathom 14 microcatheter, a regular Renegade microcatheter was advanced into a midpole division of the left renal artery. Sub selective arteriogram was performed and the vessel was percutaneously coil embolized with multiple overlapping 2 mm, 3 mm and 4 mm diameter interlock coils. Next, the microcatheter was utilized to an additional midpole division of the left renal artery and a sub selective arteriogram was performed. Next, the vessel was percutaneously coil embolized with multiple overlapping 4 mm, 5 mm and 6 mm interlock coils. The microcatheter was retracted to the level of the main left renal artery and selective left arteriogram was performed. The microcatheter was removed and a completion arteriogram was performed via the [facility] catheter. Next, prolonged efforts were made to cannulate the known tiny accessory left renal artery however this ultimately proved difficult. As such, a flush aortogram was performed demonstrating patency of the accessory left renal artery. Unfortunately, despite prolonged efforts, the tiny accessory left renal artery could not be selectively cannulated, likely secondary to a combination of suspected narrowing involving the vessel's origin (as was demonstrated on preceding CT) as well as the patient's aneurysmal and tortuous abdominal aorta. At this point, the procedure was terminated. All wires, catheters and sheaths were removed patient. Hemostasis was achieved at the right groin access site with deployment of an ExoSeal closure  device and manual compression. A dressing applied. The patient tolerated the procedure well without immediate postprocedural complication. FINDINGS: Selective left renal arteriogram demonstrates marked irregularity and contrast extravasation arising from several midpole subsegmental renal  arteries. As such, both co-dominant midpole segmental arteries were sub selective and percutaneously coil embolized. Completion arteriogram demonstrates a technically excellent result without evidence of residual contrast extravasation or vessel irregularity. Completion left renal arteriogram demonstrates patency of several superior pole segmental divisions without additional area contrast extravasation or vessel irregularity. Flush abdominal aortogram demonstrates patency of the known accessory tiny left renal artery. Despite prolonged efforts, the accessory left renal artery could not be selectively cannulated, likely secondary to a combination of suspected narrowing of the vessel's origin (as was demonstrated on preceding CT), as well as the patient's aneurysmal and tortuous abdominal aorta. IMPRESSION: 1. Technically successful percutaneous coil embolization of two mid pole segmental arteries supplying ill-defined areas of contrast extravasation and vessel irregularity. 2. Attempted, though unsuccessful, cannulation of the known tiny left accessory renal artery likely to secondary to a combination of suspected narrowing of the vessel's origin (as was demonstrated on preceding CT) as well as the patient's aneurysmal and tortuous abdominal aorta. 3. Successful image guided placement of a non tunneled right triple-lumen central venous catheter with tip terminating within the superior cavoatrial junction. The central venous catheter is ready for immediate use. Electronically Signed   By: Sandi Mariscal M.D.   On: 09/15/2019 16:52   Dg Chest Port 1 View  Result Date: 09/17/2019 CLINICAL DATA:  Shortness of breath.  Wheezing.  Weakness. EXAM: PORTABLE CHEST 1 VIEW COMPARISON:  09/16/2019; CT of the chest, abdomen and pelvis-09/15/2019 FINDINGS: Grossly unchanged enlarged cardiac silhouette and mediastinal contours with atherosclerotic plaque within a tortuous thoracic aorta. Stable position of support apparatus.  There is persistent elevation/eventration of the left hemidiaphragm. Nodular heterogeneous opacities within the right lung are unchanged. No new focal airspace opacities. No evidence of edema. No acute or aggressive osseous abnormalities. Persistent gaseous distention of the colon. IMPRESSION: 1. Persistent mild elevation of the left hemidiaphragm with associated bibasilar opacities, likely atelectasis. 2.  Aortic Atherosclerosis (ICD10-I70.0). Electronically Signed   By: Sandi Mariscal M.D.   On: 09/17/2019 08:46   Dg Chest Port 1 View  Result Date: 09/16/2019 CLINICAL DATA:  Rib fractures -left EXAM: PORTABLE CHEST 1 VIEW COMPARISON:  09/15/2019 FINDINGS: RIGHT-sided central line tip overlies the superior vena cava. There is persistent cardiomegaly, stable in configuration. Dense atherosclerotic calcification of the thoracic aorta. Stable elevation of the LEFT hemidiaphragm. Interstitial thickening, likely related to mild edema. Distended bowel loops beneath the hemidiaphragms. IMPRESSION: 1. Stable cardiomegaly and mild edema. 2. Stable elevation of the LEFT hemidiaphragm. 3. Distended bowel loops beneath the hemidiaphragms. Electronically Signed   By: Nolon Nations M.D.   On: 09/16/2019 09:56   [place] Guide Roadmapping  Result Date: 09/15/2019 INDICATION: Fall on anticoagulation, now with left-sided renal fractures and left renal laceration. Request made for left renal arteriogram and potential embolization. Request also made for placement of a image guided central venous catheter for durable intravenous access. EXAM: 1. ULTRASOUND GUIDANCE FOR VENOUS ACCESS 2. FLUOROSCOPIC GUIDED PLACEMENT OF A NON TUNNELED TRIPLE-LUMEN CENTRAL VENOUS CATHETER. 3. ULTRASOUND GUIDANCE FOR ARTERIAL ACCESS 4. FLUSH ABDOMINAL AORTOGRAM 5. SELECTIVE LEFT RENAL ARTERIOGRAM 6. SUB SELECTIVE LEFT ARTERIOGRAM AND PERCUTANEOUS COIL EMBOLIZATION COMPARISON:  CT the chest, abdomen and pelvis -  earlier same day MEDICATIONS: NONE ANESTHESIA/SEDATION: Moderate (conscious)  sedation was employed during this procedure. A total of Versed 2 mg and Fentanyl 100 mcg was administered intravenously. Moderate Sedation Time: 85 minutes. The patient's level of consciousness and vital signs were monitored continuously by radiology nursing throughout the procedure under my direct supervision. CONTRAST:  75 cc Omnipaque 300 FLUOROSCOPY TIME:  24 minutes (4,008 mGy) COMPLICATIONS: None immediate. PROCEDURE: Informed consent was obtained from the patient following explanation of the procedure, risks, benefits and alternatives. The patient understands, agrees and consents for the procedure. All questions were addressed. A time out was performed prior to the initiation of the procedure. Maximal barrier sterile technique utilized including caps, mask, sterile gowns, sterile gloves, large sterile drape, hand hygiene, and Betadine prep. Attention was initially paid towards placement of a non tunneled central venous catheter. Under direct ultrasound guidance, the left internal jugular vein was accessed with a micropuncture kit. A micropuncture wire was utilized for measurement purposes. Under fluoroscopic guidance, a peel-away sheath was placed and a 15 cm triple-lumen non tunneled central venous catheter was inserted with tip terminating at the level the superior cavoatrial junction. Postprocedural spot fluoroscopic radiographic image was obtained. The central venous catheter was secured at the neck entrance site within interrupted suture. A dressing was applied. _________________________________________________________ The right femoral head was marked fluoroscopically. Under ultrasound guidance, the right common femoral artery was accessed with a micropuncture kit after the overlying soft tissues were anesthetized with 1% lidocaine. An ultrasound image was saved for documentation purposes. The micropuncture sheath was exchanged  for a 5 [place] vascular sheath over a Bentson wire. A closure arteriogram was performed through the side of the sheath confirming access within the right common femoral artery. Over a Bentson wire, a Mickelson catheter was advanced to the level of the inferior aspect of the thoracic aorta where it was back bled and flushed. The catheter was then utilized to select the left renal artery and a selective left renal arteriogram was performed. Next, with the use of a fathom 14 microcatheter, a regular Renegade microcatheter was advanced into a midpole division of the left renal artery. Sub selective arteriogram was performed and the vessel was percutaneously coil embolized with multiple overlapping 2 mm, 3 mm and 4 mm diameter interlock coils. Next, the microcatheter was utilized to an additional midpole division of the left renal artery and a sub selective arteriogram was performed. Next, the vessel was percutaneously coil embolized with multiple overlapping 4 mm, 5 mm and 6 mm interlock coils. The microcatheter was retracted to the level of the main left renal artery and selective left arteriogram was performed. The microcatheter was removed and a completion arteriogram was performed via the [facility] catheter. Next, prolonged efforts were made to cannulate the known tiny accessory left renal artery however this ultimately proved difficult. As such, a flush aortogram was performed demonstrating patency of the accessory left renal artery. Unfortunately, despite prolonged efforts, the tiny accessory left renal artery could not be selectively cannulated, likely secondary to a combination of suspected narrowing involving the vessel's origin (as was demonstrated on preceding CT) as well as the patient's aneurysmal and tortuous abdominal aorta. At this point, the procedure was terminated. All wires, catheters and sheaths were removed patient. Hemostasis was achieved at the right groin access site with deployment of an  ExoSeal closure device and manual compression. A dressing applied. The patient tolerated the procedure well without immediate postprocedural complication. FINDINGS: Selective left renal arteriogram demonstrates marked irregularity and contrast extravasation arising from several midpole subsegmental renal  arteries. As such, both co-dominant midpole segmental arteries were sub selective and percutaneously coil embolized. Completion arteriogram demonstrates a technically excellent result without evidence of residual contrast extravasation or vessel irregularity. Completion left renal arteriogram demonstrates patency of several superior pole segmental divisions without additional area contrast extravasation or vessel irregularity. Flush abdominal aortogram demonstrates patency of the known accessory tiny left renal artery. Despite prolonged efforts, the accessory left renal artery could not be selectively cannulated, likely secondary to a combination of suspected narrowing of the vessel's origin (as was demonstrated on preceding CT), as well as the patient's aneurysmal and tortuous abdominal aorta. IMPRESSION: 1. Technically successful percutaneous coil embolization of two mid pole segmental arteries supplying ill-defined areas of contrast extravasation and vessel irregularity. 2. Attempted, though unsuccessful, cannulation of the known tiny left accessory renal artery likely to secondary to a combination of suspected narrowing of the vessel's origin (as was demonstrated on preceding CT) as well as the patient's aneurysmal and tortuous abdominal aorta. 3. Successful image guided placement of a non tunneled right triple-lumen central venous catheter with tip terminating within the superior cavoatrial junction. The central venous catheter is ready for immediate use. Electronically Signed   By: Sandi Mariscal M.D.   On: 09/15/2019 16:52    Anti-infectives: Anti-infectives (From admission, onward)   None        Assessment/Plan Fall L rib fxs 7-11- pain control and pulm toilet, IS, follow up CXR has been stable L kidney lac with possible active extravasation and large hematoma -s/p IR embolization 10/14 - Cr increasing, 2.13, not unexpected monitor Cr and UOP ABL anemia - hgb stabilized around 11.7 last night, repeat CBC this AM Atrial fibrillation- on xarelto (last dose 10/13 in PM) - hold xarelto, resumed other cardiac meds yesterday  HTN HLD H/o colon cancer s/p partial colectomy ~2006 Ventral hernia 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 IS and flutter valve with prn duonebs.   LOS: 2 days    Brigid Re , [facility] Surgery 09/17/2019, 10:22 AM Please see Amion for pager number during day hours 7:00am-4:30pm

## 2019-09-17 NOTE — Evaluation (Signed)
Clinical/Bedside Swallow Evaluation Patient Details  Name: Nehan Brocks MRN: [id number] Date of Birth: 15-Nov-1923  Today's Date: 09/17/2019 Time: SLP Start Time (ACUTE ONLY): 1416 SLP Stop Time (ACUTE ONLY): 1442 SLP Time Calculation (min) (ACUTE ONLY): 26 min  Past Medical History:  Past Medical History:  Diagnosis Date  . Arthritis   . Atrial fibrillation ([place])   . Cancer [facility])    colon ca - surgery, no chemo or radiation  . Hypertension   . Right shoulder pain 09/30/12   recently   Past Surgical History:  Past Surgical History:  Procedure Laterality Date  . CATARACT EXTRACTION W/ INTRAOCULAR LENS IMPLANT     b/l  . colon surgery for colon cancer  approx. 2006  . IR EMBO ART  VEN HEMORR LYMPH EXTRAV  INC GUIDE ROADMAPPING  09/15/2019  . IR FLUORO GUIDE CV LINE RIGHT  09/15/2019  . IR RENAL SUPRASEL UNI S&I MOD SED  09/15/2019  . IR US GUIDE VASC ACCESS RIGHT  09/15/2019  . IR US GUIDE VASC ACCESS RIGHT  09/15/2019   HPI:  Pt is a 85 yo male admitted s/p fall with L rib fxs 7-11 and L kidney lac s/p IR emobolization 10/14. Pt was advancing his diet but developed increased SOB. CXR 10/16 showed bibasilar opacities, likely atelectasis. SLP swallow evaluation was ordered. PMH includes: HTN, colon cancer, Afib, arthritis, and self-reported GER   Assessment / Plan / Recommendation Clinical Impression  Pt has no overt s/s of aspiration even when challenged, but remains at risk for aspiration given his deconditioning, easy fatigue, and shortness of breath. Cough is also somewhat guarded in light of acute injuries. He is agreeable to a chopped diet (Dys 2) for energy conservation. We discussed precautions including taking frequent rest breaks and using small bites/sips. He also reports frequent "indigestion" at home, so may want to implement esophageal precautions in addition to aspiration ones. SLP will f/u for tolerance and potential to advance as his respiratory status and overall  endurance improve.  SLP Visit Diagnosis: Dysphagia, unspecified (R13.10)    Aspiration Risk  Moderate aspiration risk;Mild aspiration risk    Diet Recommendation Dysphagia 2 (Fine chop);Thin liquid   Liquid Administration via: Cup;Straw Medication Administration: Whole meds with puree Supervision: Patient able to self feed;Intermittent supervision to cue for compensatory strategies Compensations: Slow rate;Small sips/bites;Other (Comment)(take frequent rest breaks) Postural Changes: Seated upright at 90 degrees;Remain upright for at least 30 minutes after po intake    Other  Recommendations Oral Care Recommendations: Oral care BID   Follow up Recommendations (tba)      Frequency and Duration min 2x/week  2 weeks       Prognosis Prognosis for Safe Diet Advancement: Good      Swallow Study   General HPI: Pt is a 85 yo male admitted s/p fall with L rib fxs 7-11 and L kidney lac s/p IR emobolization 10/14. Pt was advancing his diet but developed increased SOB. CXR 10/16 showed bibasilar opacities, likely atelectasis. SLP swallow evaluation was ordered. PMH includes: HTN, colon cancer, Afib, arthritis, and self-reported GER Type of Study: Bedside Swallow Evaluation Previous Swallow Assessment: none in chart Diet Prior to this Study: NPO Temperature Spikes Noted: No Respiratory Status: Nasal cannula History of Recent Intubation: No Behavior/Cognition: Alert;Cooperative;Pleasant mood;Other (Comment)(somewhat HOH) Oral Cavity Assessment: Within Functional Limits Oral Care Completed by SLP: No Oral Cavity - Dentition: Dentures, top;Dentures, bottom Vision: Functional for self-feeding Self-Feeding Abilities: Able to feed self Patient Positioning: Upright in bed Baseline  Vocal Quality: Normal Volitional Cough: Weak Volitional Swallow: Able to elicit    Oral/Motor/Sensory Function Overall Oral Motor/Sensory Function: Within functional limits   Ice Chips Ice chips: Not tested    Thin Liquid Thin Liquid: Within functional limits Presentation: Cup;Self Fed;Straw    Nectar Thick Nectar Thick Liquid: Not tested   Honey Thick Honey Thick Liquid: Not tested   Puree Puree: Within functional limits Presentation: Spoon;Self Fed   Solid     Solid: Within functional limits Presentation: Self Fed      Venita Sheffield Alleyne Lac 09/17/2019,3:27 PM   Pollyann Glen, M.A. [place] Acute Environmental education officer [phone] Office [phone]

## 2019-09-17 NOTE — Progress Notes (Signed)
Called Erma Vashti Hey  (friend) to inform of transfer to [facility]. Will now transfer patient. Modena Morrow E, RN 09/17/2019 2:28 PM

## 2019-09-18 LAB — BASIC METABOLIC PANEL
Anion gap: 8 (ref 5–15)
BUN: 37 mg/dL — ABNORMAL HIGH (ref 8–23)
CO2: 23 mmol/L (ref 22–32)
Calcium: 8.1 mg/dL — ABNORMAL LOW (ref 8.9–10.3)
Chloride: 102 mmol/L (ref 98–111)
Creatinine, Ser: 2.55 mg/dL — ABNORMAL HIGH (ref 0.61–1.24)
GFR calc Af Amer: 24 mL/min — ABNORMAL LOW (ref >60)
GFR calc non Af Amer: 21 mL/min — ABNORMAL LOW (ref >60)
Glucose, Bld: 129 mg/dL — ABNORMAL HIGH (ref 70–99)
Potassium: 3.3 mmol/L — ABNORMAL LOW (ref 3.5–5.1)
Sodium: 133 mmol/L — ABNORMAL LOW (ref 135–145)

## 2019-09-18 LAB — CBC
HCT: 36.8 % — ABNORMAL LOW (ref 39.0–52.0)
Hemoglobin: 12.1 g/dL — ABNORMAL LOW (ref 13.0–17.0)
MCH: 31.8 pg (ref 26.0–34.0)
MCHC: 32.9 g/dL (ref 30.0–36.0)
MCV: 96.6 fL (ref 80.0–100.0)
Platelets: 160 10*3/uL (ref 150–400)
RBC: 3.81 MIL/uL — ABNORMAL LOW (ref 4.22–5.81)
RDW: 14.6 % (ref 11.5–15.5)
WBC: 18.6 10*3/uL — ABNORMAL HIGH (ref 4.0–10.5)
nRBC: 0 % (ref 0.0–0.2)

## 2019-09-18 NOTE — Progress Notes (Signed)
Subjective/Chief Complaint: Pt working with PT Tol PO   Objective: Vital signs in last 24 hours: Temp:  [97.3 F (36.3 C)-97.8 F (36.6 C)] 97.8 F (36.6 C) (10/17 0255) Pulse Rate:  [89-119] 112 (10/17 0255) Resp:  [21-34] 25 (10/17 0349) BP: (101-131)/(59-89) 131/83 (10/17 0255) SpO2:  [81 %-100 %] 97 % (10/17 0255) Last BM Date: 09/17/19  Intake/Output from previous day: 10/16 0701 - 10/17 0700 In: 1609.4 [P.O.:120; I.V.:1122.9; IV Piggyback:366.5] Out: 1000 [Urine:1000] Intake/Output this shift: No intake/output data recorded.  Constitutional: No acute distress, conversant, appears states age. Eyes: Anicteric sclerae, moist conjunctiva, no lid lag Lungs: Clear to auscultation bilaterally, normal respiratory effort CV: regular rate and rhythm, no murmurs, no peripheral edema, pedal pulses 2+ GI: Soft, no masses or hepatosplenomegaly, non-tender to palpation Skin: No rashes, palpation reveals normal turgor Psychiatric: appropriate judgment and insight, oriented to person, place, and time   Lab Results:  Recent Labs    09/17/19 1127 09/18/19 0224  WBC 19.3* 18.6*  HGB 11.6* 12.1*  HCT 34.6* 36.8*  PLT 161 160   BMET Recent Labs    09/17/19 0518 09/18/19 0224  NA 135 133*  K 2.8* 3.3*  CL 102 102  CO2 22 23  GLUCOSE 160* 129*  BUN 32* 37*  CREATININE 2.13* 2.55*  CALCIUM 8.3* 8.1*   PT/INR Recent Labs    09/15/19 0922  LABPROT 22.8*  INR 2.0*   ABG No results for input(s): PHART, HCO3 in the last 72 hours.  Invalid input(s): PCO2, PO2  Studies/Results: Dg Chest Port 1 View  Result Date: 09/17/2019 CLINICAL DATA:  Shortness of breath.  Wheezing.  Weakness. EXAM: PORTABLE CHEST 1 VIEW COMPARISON:  09/16/2019; CT of the chest, abdomen and pelvis-09/15/2019 FINDINGS: Grossly unchanged enlarged cardiac silhouette and mediastinal contours with atherosclerotic plaque within a tortuous thoracic aorta. Stable position of support apparatus. There  is persistent elevation/eventration of the left hemidiaphragm. Nodular heterogeneous opacities within the right lung are unchanged. No new focal airspace opacities. No evidence of edema. No acute or aggressive osseous abnormalities. Persistent gaseous distention of the colon. IMPRESSION: 1. Persistent mild elevation of the left hemidiaphragm with associated bibasilar opacities, likely atelectasis. 2.  Aortic Atherosclerosis (ICD10-I70.0). Electronically Signed   By: Sandi Mariscal M.D.   On: 09/17/2019 08:46   Dg Chest Port 1 View  Result Date: 09/16/2019 CLINICAL DATA:  Rib fractures -left EXAM: PORTABLE CHEST 1 VIEW COMPARISON:  09/15/2019 FINDINGS: RIGHT-sided central line tip overlies the superior vena cava. There is persistent cardiomegaly, stable in configuration. Dense atherosclerotic calcification of the thoracic aorta. Stable elevation of the LEFT hemidiaphragm. Interstitial thickening, likely related to mild edema. Distended bowel loops beneath the hemidiaphragms. IMPRESSION: 1. Stable cardiomegaly and mild edema. 2. Stable elevation of the LEFT hemidiaphragm. 3. Distended bowel loops beneath the hemidiaphragms. Electronically Signed   By: Nolon Nations M.D.   On: 09/16/2019 09:56    Assessment/Plan: Fall L rib fxs 7-11- pain control and pulm toilet, IS, follow up CXR has been stable L kidney lac with possible active extravasation and large hematoma -s/p IR embolization 10/14 - Cr increasing, 2.55, not unexpected monitor Cr.  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  Con't with PT. IS and  flutter valve with prn duonebs.   LOS: 3 days    Ralene Ok 09/18/2019

## 2019-09-19 LAB — BASIC METABOLIC PANEL
Anion gap: 10 (ref 5–15)
BUN: 42 mg/dL — ABNORMAL HIGH (ref 8–23)
CO2: 19 mmol/L — ABNORMAL LOW (ref 22–32)
Calcium: 8 mg/dL — ABNORMAL LOW (ref 8.9–10.3)
Chloride: 107 mmol/L (ref 98–111)
Creatinine, Ser: 2.54 mg/dL — ABNORMAL HIGH (ref 0.61–1.24)
GFR calc Af Amer: 24 mL/min — ABNORMAL LOW (ref >60)
GFR calc non Af Amer: 21 mL/min — ABNORMAL LOW (ref >60)
Glucose, Bld: 147 mg/dL — ABNORMAL HIGH (ref 70–99)
Potassium: 4.6 mmol/L (ref 3.5–5.1)
Sodium: 136 mmol/L (ref 135–145)

## 2019-09-19 LAB — CBC
HCT: 33.2 % — ABNORMAL LOW (ref 39.0–52.0)
Hemoglobin: 11 g/dL — ABNORMAL LOW (ref 13.0–17.0)
MCH: 32.6 pg (ref 26.0–34.0)
MCHC: 33.1 g/dL (ref 30.0–36.0)
MCV: 98.5 fL (ref 80.0–100.0)
Platelets: 181 10*3/uL (ref 150–400)
RBC: 3.37 MIL/uL — ABNORMAL LOW (ref 4.22–5.81)
RDW: 14.7 % (ref 11.5–15.5)
WBC: 13.7 10*3/uL — ABNORMAL HIGH (ref 4.0–10.5)
nRBC: 0 % (ref 0.0–0.2)

## 2019-09-19 MED ORDER — GUAIFENESIN ER 600 MG PO TB12
600.0000 mg | ORAL_TABLET | Freq: Two times a day (BID) | ORAL | Status: DC
  Administered 2019-09-19 – 2019-09-28 (×20): 600 mg via ORAL
  Filled 2019-09-19 (×20): qty 1

## 2019-09-19 NOTE — TOC Initial Note (Signed)
Transition of Care [facility]) - Initial/Assessment Note    Patient Details  Name: [name] [name] MRN: [id number] Date of Birth: 12-26-1922  [facility] CM/SW Contact:    Wende Neighbors, LCSW Phone Number: 09/19/2019, 11:52 AM  Clinical Narrative:  CSW met patient at bedside to discuss discharge plans. Patient lives at home alone and is agreeable to go to SNF for rehab. CSW went over the process of SNF placement stating that we would need to get authorization prior to discharge. Patient was extremely tired during assessment so CSW cut assessment short. CSW will follow up with patient once bed offers are available                    Expected Discharge Plan: [place] Barriers to Discharge: Continued Medical Work up, Ship broker   Patient Goals and CMS Choice Patient states their goals for this hospitalization and ongoing recovery are:: to be home CMS Medicare.gov Compare Post Acute Care list provided to:: Patient Choice offered to / list presented to : Patient  Expected Discharge Plan and Services Expected Discharge Plan: [place] In-house Referral: Clinical Social Work   Post Acute Care Choice: [place] Living arrangements for the past 2 months: [place]                                      Prior Living Arrangements/Services Living arrangements for the past 2 months: Single Family Home Lives with:: Self Patient language and need for interpreter reviewed:: Yes Do you feel safe going back to the place where you live?: Yes      Need for Family Participation in Patient Care: Yes (Comment) Care giver support system in place?: Yes (comment)   Criminal Activity/Legal Involvement Pertinent to Current Situation/Hospitalization: No - Comment as needed  Activities of Daily Living      Permission Sought/Granted   Permission granted to share information with : Yes, Verbal Permission Granted  Share  Information with NAME: Erma Vashti Hey     Permission granted to share info w Relationship: friend  Permission granted to share info w Contact Information: [phone]  Emotional Assessment Appearance:: Appears stated age Attitude/Demeanor/Rapport: Engaged Affect (typically observed): Accepting, Pleasant Orientation: : Oriented to Self, Oriented to Place, Oriented to  Time, Oriented to Situation Alcohol / Substance Use: Not Applicable Psych Involvement: No (comment)  Admission diagnosis:  Renal embolism (HCC) [N28.0] Anticoagulated by anticoagulation treatment [Z79.01] Closed head injury, initial encounter [S09.90XA] Laceration of left kidney, initial encounter [E15.830N] Strain of neck muscle, initial encounter [S16.1XXA] Fall, initial encounter [id number]] Closed fracture of multiple ribs of left side, initial encounter [S22.42XA] Patient Active Problem List   Diagnosis Date Noted  . Kidney laceration, left 09/15/2019  . Acute anterior epistaxis 09/30/2012  . Dehydration 09/30/2012  . Hyperkalemia 09/30/2012  . Hyponatremia 09/30/2012  . HTN (hypertension) 09/30/2012  . A-fib ([place]) 09/30/2012   PCP:  [name], MD Pharmacy:   [facility], [place] [address] Phone: [phone] Fax: [phone]     Social Determinants of Health (SDOH) Interventions    Readmission Risk Interventions No flowsheet data found.

## 2019-09-19 NOTE — Progress Notes (Signed)
  Speech Language Pathology Treatment: Dysphagia  Patient Details Name: [name] [name] MRN: [id number] DOB: [birth year] Today's Date: 09/19/2019 Time: 1100-1120 SLP Time Calculation (min) (ACUTE ONLY): 20 min  Assessment / Plan / Recommendation Clinical Impression  Pt seen with am meal and RN at bedside, assisted with meds whole in oatmeal. Pt has a congested cough at baseline, but also has wet vocal quality and coughing after sips of water quite consistently. Will f/u with MBS today if possible. Pt may continue sips and bites of soft solids until instrumental assessment complete.   HPI HPI: Pt is a 85 yo male admitted s/p fall with L rib fxs 7-11 and L kidney lac s/p IR emobolization 10/14. Pt was advancing his diet but developed increased SOB. CXR 10/16 showed bibasilar opacities, likely atelectasis. SLP swallow evaluation was ordered. PMH includes: HTN, colon cancer, Afib, arthritis, and self-reported GER      SLP Plan  Continue with current plan of care       Recommendations  Diet recommendations: Thin liquid;Dysphagia 1 (puree) Medication Administration: Whole meds with puree Supervision: Staff to assist with self feeding Compensations: Slow rate;Small sips/bites;Other (Comment) Postural Changes and/or Swallow Maneuvers: Seated upright 90 degrees                Follow up Recommendations: 24 hour supervision/assistance SLP Visit Diagnosis: Dysphagia, unspecified (R13.10) Plan: Continue with current plan of care       GO               [name] [place], MA [place] Pager [phone] Office [phone]  Lynann Beaver 09/19/2019, 11:22 AM

## 2019-09-19 NOTE — Progress Notes (Signed)
   Subjective/Chief Complaint: Pt with no acute changes Urine remains dark  Objective: Vital signs in last 24 hours: Temp:  [97.5 F (36.4 C)-98.4 F (36.9 C)] 97.5 F (36.4 C) (10/18 0504) Pulse Rate:  [76-111] 76 (10/18 0538) Resp:  [17-29] 17 (10/18 0542) BP: (98-141)/(70-103) 123/103 (10/18 0030) SpO2:  [99 %-100 %] 99 % (10/18 0514) Last BM Date: 09/18/19  Intake/Output from previous day: 10/17 0701 - 10/18 0700 In: 1580 [P.O.:680; I.V.:900] Out: 300 [Urine:300] Intake/Output this shift: No intake/output data recorded.  PE Constitutional: No acute distress, conversant, appears states age. Eyes: Anicteric sclerae, moist conjunctiva, no lid lag Lungs: Clear to auscultation bilaterally, normal respiratory effort CV: regular rate and rhythm, no murmurs, no peripheral edema, pedal pulses 2+ GI: Soft, no masses or hepatosplenomegaly, non-tender to palpation Skin: No rashes, palpation reveals normal turgor Psychiatric: appropriate judgment and insight, oriented to person, place, and time  Lab Results:  Recent Labs    09/18/19 0224 09/19/19 0239  WBC 18.6* 13.7*  HGB 12.1* 11.0*  HCT 36.8* 33.2*  PLT 160 181   BMET Recent Labs    09/18/19 0224 09/19/19 0239  NA 133* 136  K 3.3* 4.6  CL 102 107  CO2 23 19*  GLUCOSE 129* 147*  BUN 37* 42*  CREATININE 2.55* 2.54*  CALCIUM 8.1* 8.0*   Studies/Results: Dg Chest Port 1 View  Result Date: 09/17/2019 CLINICAL DATA:  Shortness of breath.  Wheezing.  Weakness. EXAM: PORTABLE CHEST 1 VIEW COMPARISON:  09/16/2019; CT of the chest, abdomen and pelvis-09/15/2019 FINDINGS: Grossly unchanged enlarged cardiac silhouette and mediastinal contours with atherosclerotic plaque within a tortuous thoracic aorta. Stable position of support apparatus. There is persistent elevation/eventration of the left hemidiaphragm. Nodular heterogeneous opacities within the right lung are unchanged. No new focal airspace opacities. No evidence  of edema. No acute or aggressive osseous abnormalities. Persistent gaseous distention of the colon. IMPRESSION: 1. Persistent mild elevation of the left hemidiaphragm with associated bibasilar opacities, likely atelectasis. 2.  Aortic Atherosclerosis (ICD10-I70.0). Electronically Signed   By: Sandi Mariscal M.D.   On: 09/17/2019 08:46     Assessment/Plan: Fall L rib fxs 7-11- pain control and pulm toilet, IS, follow up CXR has been stable L kidney lac with possible active extravasation and large hematoma -s/p IR embolization 10/14 - Cr stable : 2.54, not unexpected monitor Cr.  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  Con't with PT. IS and flutter valve with prn duonebs.  LOS: 4 days    Ralene Ok 09/19/2019

## 2019-09-19 NOTE — NC FL2 (Signed)
[place] LEVEL OF CARE SCREENING TOOL     IDENTIFICATION  Patient Name: [name] [name] Birthdate: 1923/05/01 Sex: male Admission Date (Current Location): 09/15/2019  [facility] and [place] Number:  Herbalist and Address:  [facility] [place]. [facility], [place] [address], [place], [place] [address]      Provider Number: [id number]  Attending Physician Name and Address:  Md, Trauma, MD  Relative Name and Phone Number:  Cleda Mccreedy [phone]    Current Level of Care: Hospital Recommended Level of Care: [place] Prior Approval Number:    Date Approved/Denied:   PASRR Number: [id number]  Discharge Plan: SNF    Current Diagnoses: Patient Active Problem List   Diagnosis Date Noted  . Kidney laceration, left 09/15/2019  . Acute anterior epistaxis 09/30/2012  . Dehydration 09/30/2012  . Hyperkalemia 09/30/2012  . Hyponatremia 09/30/2012  . HTN (hypertension) 09/30/2012  . A-fib ([place]) 09/30/2012    Orientation RESPIRATION BLADDER Height & Weight     Self, Time, Situation, Place  O2(2L) Continent Weight: 247 lb 9.2 oz (112.3 kg) Height:  6' (182.9 cm)  BEHAVIORAL SYMPTOMS/MOOD NEUROLOGICAL BOWEL NUTRITION STATUS      Continent Diet(dsy2)  AMBULATORY STATUS COMMUNICATION OF NEEDS Skin   Limited Assist Verbally Bruising(arm and Lside of back)                       Personal Care Assistance Level of Assistance  Bathing, Feeding, Dressing Bathing Assistance: Limited assistance Feeding assistance: Independent Dressing Assistance: Limited assistance     Functional Limitations Info  Sight, Hearing, Speech Sight Info: Adequate Hearing Info: Adequate Speech Info: Adequate    SPECIAL CARE FACTORS FREQUENCY  PT (By licensed PT), OT (By licensed OT)     PT Frequency: 5x wk OT Frequency: 5x wk            Contractures Contractures Info: Not present    Additional Factors Info  Code Status, Allergies Code Status Info:  Full code Allergies Info: Penicillins           Current Medications (09/19/2019):  This is the current hospital active medication list Current Facility-Administered Medications  Medication Dose Route Frequency Provider Last Rate Last Dose  . acetaminophen (TYLENOL) tablet 650 mg  650 mg Oral Q6H Rayburn, Kelly A, PA-C   650 mg at 09/19/19 1104  . atorvastatin (LIPITOR) tablet 80 mg  80 mg Oral Daily Rayburn, Kelly A, PA-C   80 mg at 09/18/19 1554  . B-complex with vitamin C tablet 1 tablet  1 tablet Oral Daily Rayburn, Kelly A, PA-C   1 tablet at 09/19/19 1103  . bisacodyl (DULCOLAX) suppository 10 mg  10 mg Rectal Daily PRN Rayburn, Floyce Stakes, PA-C      . Chlorhexidine Gluconate Cloth 2 % PADS 6 each  6 each Topical Daily Rayburn, Kelly A, PA-C   6 each at 09/18/19 0944  . dextrose 5 %-0.9 % sodium chloride infusion   Intravenous Continuous Rayburn, Kelly A, PA-C 75 mL/hr at 09/19/19 0231    . docusate sodium (COLACE) capsule 100 mg  100 mg Oral BID Rayburn, Kelly A, PA-C   100 mg at 09/19/19 1103  . fentaNYL (SUBLIMAZE) injection 12.5 mcg  12.5 mcg Intravenous Q2H PRN Rayburn, Kelly A, PA-C   12.5 mcg at 09/16/19 0244  . guaiFENesin (MUCINEX) 12 hr tablet 600 mg  600 mg Oral BID Romana Juniper A, MD   600 mg at 09/19/19 1104  .  ipratropium-albuterol (DUONEB) 0.5-2.5 (3) MG/3ML nebulizer solution 3 mL  3 mL Nebulization Q6H PRN Rayburn, Kelly A, PA-C   3 mL at 09/19/19 0030  . methocarbamol (ROBAXIN) tablet 500 mg  500 mg Oral TID Rayburn, Kelly A, PA-C   500 mg at 09/19/19 1104  . metoprolol succinate (TOPROL-XL) 24 hr tablet 25 mg  25 mg Oral Daily Rayburn, Kelly A, PA-C   25 mg at 09/19/19 1103  . metoprolol tartrate (LOPRESSOR) injection 5 mg  5 mg Intravenous Q6H PRN Rayburn, Kelly A, PA-C   5 mg at 09/15/19 2304  . mupirocin ointment (BACTROBAN) 2 % 1 application  1 application Nasal BID Rayburn, Kelly A, PA-C   1 application at [id number]  . ondansetron (ZOFRAN-ODT) disintegrating  tablet 4 mg  4 mg Oral Q6H PRN Rayburn, Kelly A, PA-C       Or  . ondansetron (ZOFRAN) injection 4 mg  4 mg Intravenous Q6H PRN Rayburn, Kelly A, PA-C      . pantoprazole (PROTONIX) EC tablet 40 mg  40 mg Oral Daily Rayburn, Kelly A, PA-C   40 mg at 09/19/19 1103  . traMADol (ULTRAM) tablet 50 mg  50 mg Oral Q6H PRN Rayburn, Kelly A, PA-C   50 mg at 09/16/19 0243     Discharge Medications: Please see discharge summary for a list of discharge medications.  Relevant Imaging Results:  Relevant Lab Results:   Additional Information SS# [id number]  Wende Neighbors, LCSW

## 2019-09-19 NOTE — Progress Notes (Signed)
SLP Cancellation Note  Patient Details Name: [name] [name] MRN: [id number] DOB: Nov 17, 1923   Cancelled treatment:       Reason Eval/Treat Not Completed: Other (comment). Will proceed with MBS on 10/19.    Ludie Hudon, Katherene Ponto 09/19/2019, 1:56 PM

## 2019-09-20 ENCOUNTER — Inpatient Hospital Stay (HOSPITAL_COMMUNITY): Payer: Medicare HMO

## 2019-09-20 LAB — CBC
HCT: 32.2 % — ABNORMAL LOW (ref 39.0–52.0)
Hemoglobin: 10.7 g/dL — ABNORMAL LOW (ref 13.0–17.0)
MCH: 32.9 pg (ref 26.0–34.0)
MCHC: 33.2 g/dL (ref 30.0–36.0)
MCV: 99.1 fL (ref 80.0–100.0)
Platelets: 187 10*3/uL (ref 150–400)
RBC: 3.25 MIL/uL — ABNORMAL LOW (ref 4.22–5.81)
RDW: 14.6 % (ref 11.5–15.5)
WBC: 11.6 10*3/uL — ABNORMAL HIGH (ref 4.0–10.5)
nRBC: 0 % (ref 0.0–0.2)

## 2019-09-20 LAB — BASIC METABOLIC PANEL
Anion gap: 7 (ref 5–15)
BUN: 40 mg/dL — ABNORMAL HIGH (ref 8–23)
CO2: 21 mmol/L — ABNORMAL LOW (ref 22–32)
Calcium: 7.9 mg/dL — ABNORMAL LOW (ref 8.9–10.3)
Chloride: 108 mmol/L (ref 98–111)
Creatinine, Ser: 2.24 mg/dL — ABNORMAL HIGH (ref 0.61–1.24)
GFR calc Af Amer: 28 mL/min — ABNORMAL LOW (ref >60)
GFR calc non Af Amer: 24 mL/min — ABNORMAL LOW (ref >60)
Glucose, Bld: 132 mg/dL — ABNORMAL HIGH (ref 70–99)
Potassium: 3.5 mmol/L (ref 3.5–5.1)
Sodium: 136 mmol/L (ref 135–145)

## 2019-09-20 LAB — PHOSPHORUS: Phosphorus: 4 mg/dL (ref 2.5–4.6)

## 2019-09-20 LAB — MAGNESIUM: Magnesium: 1.9 mg/dL (ref 1.7–2.4)

## 2019-09-20 MED ORDER — FUROSEMIDE 10 MG/ML IJ SOLN
40.0000 mg | Freq: Once | INTRAMUSCULAR | Status: DC
  Filled 2019-09-20: qty 4

## 2019-09-20 MED ORDER — IPRATROPIUM-ALBUTEROL 0.5-2.5 (3) MG/3ML IN SOLN
3.0000 mL | Freq: Three times a day (TID) | RESPIRATORY_TRACT | Status: DC
  Administered 2019-09-20 – 2019-09-21 (×2): 3 mL via RESPIRATORY_TRACT
  Filled 2019-09-20 (×2): qty 3

## 2019-09-20 MED ORDER — POTASSIUM CHLORIDE 20 MEQ/15ML (10%) PO SOLN
40.0000 meq | Freq: Once | ORAL | Status: AC
  Administered 2019-09-20: 40 meq via ORAL
  Filled 2019-09-20: qty 30

## 2019-09-20 MED ORDER — POLYETHYLENE GLYCOL 3350 17 G PO PACK
17.0000 g | PACK | Freq: Every day | ORAL | Status: DC
  Administered 2019-09-20 – 2019-09-25 (×6): 17 g via ORAL
  Filled 2019-09-20 (×8): qty 1

## 2019-09-20 MED ORDER — MAGNESIUM SULFATE IN D5W 1-5 GM/100ML-% IV SOLN
1.0000 g | Freq: Once | INTRAVENOUS | Status: AC
  Administered 2019-09-20: 1 g via INTRAVENOUS
  Filled 2019-09-20: qty 100

## 2019-09-20 MED ORDER — FUROSEMIDE 10 MG/ML IJ SOLN
40.0000 mg | Freq: Once | INTRAMUSCULAR | Status: AC
  Administered 2019-09-20: 14:00:00 40 mg via INTRAVENOUS
  Filled 2019-09-20: qty 4

## 2019-09-20 MED ORDER — IPRATROPIUM-ALBUTEROL 0.5-2.5 (3) MG/3ML IN SOLN
3.0000 mL | Freq: Three times a day (TID) | RESPIRATORY_TRACT | Status: DC
  Administered 2019-09-20: 3 mL via RESPIRATORY_TRACT

## 2019-09-20 NOTE — Progress Notes (Signed)
Modified Barium Swallow Progress Note  Patient Details  Name: [name] [name] MRN: [id number] Date of Birth: 30-Oct-1923  Today's Date: 09/20/2019  Modified Barium Swallow completed.  Full report located under Chart Review in the Imaging Section.  Brief recommendations include the following:  Clinical Impression  Pt has a moderate oropharyngeal and cervical esophageal dysphagia, felt to be acute on chronic in nature. He has delayed oral transit with reduced bolus cohesion and mild oral residuals, suspect related to acute deconditioning. His oral residue often spills posteriorly to the valleculae, but he clears oral residue well with a second swallow. Small amounts of penetration occur during the swallow with thin liquids, but most of his penetration and aspiration occur after the swallow. His UES appears to be tight, with boluses partially entering his esophagus but then backflowing up into the pharynx. This backflow often spills posteriorly over the arytenoids and into the larynx. It is difficult to visualize beyond the vocal folds, but silent aspiration was clearly observed with nectar thick liquids and highly suspected with other consistencies. He had the least amount of pharyngeal clearance with soft solids, which were repeatedly penetrated, cleared with reflexive swallows, and then unfortunately repenetrated. Discussed with trauma PA my concern for aspiration across meals regardless of textures, which is likely not acute given what appears to be a primary esophageal source, but he is more likely to develop complications in his deconditioned state. Pt says that he has had his esophagus stretched in the past. Could consider GI involvement. Pt may be appropriate for comfort feeds, accepting the risk of aspiration but trying to reduce it as much as possible with small, single bites/sips of soft purees and thin liquids (although of course if pt is fully accepting of aspiration risk, he could try more solid  foods as well). Thorough and frequent oral care would be important. Would use aspiration and esophageal precautions and provide assistance with careful hand feeding. Per PA, she and/or MD will discuss with pt further before making any decisions. Will continue to follow.    Swallow Evaluation Recommendations   Recommended Consults: Consider GI evaluation   SLP Diet Recommendations: Other (Comment)(defer to MD pending [place] conversation)       Medication Administration: Crushed with puree        Oral Care Recommendations: Oral care QID   Other Recommendations: Have oral suction available    Venita Sheffield Yaretzy Olazabal 09/20/2019,9:37 AM   Pollyann Glen, M.A. [place] Acute Environmental education officer [phone] Office [phone]

## 2019-09-20 NOTE — Progress Notes (Signed)
Physical Therapy Treatment Patient Details Name: [name] [name] MRN: [id number] DOB: 1923-06-28 Today's Date: 09/20/2019    History of Present Illness Patient is a 85 y/o male who presents with left rib fxs 7-11 and left renal laceration s/p IR embolization 10/14 after a fall at home. PMH includes HTN, A-fib and colon ca.    PT Comments    Patient progressing slowly towards PT goals. Just got back from a MBS test and reports feeling exhausted and worn out. Tolerated bed mobility and standing transfer with Mod A of 2. Able to take 2-3 steps along side bed with Mod A of 2 due to bil knee instability and fatigue. HR up to 137 bpm, A fib, other VSS. Continue to recommend SNF. Will follow.   Follow Up Recommendations  SNF     Equipment Recommendations  None recommended by PT    Recommendations for Other Services       Precautions / Restrictions Precautions Precautions: Fall Precaution Comments: watch 02 and HR Restrictions Weight Bearing Restrictions: No    Mobility  Bed Mobility Overal bed mobility: Needs Assistance Bed Mobility: Supine to Sit;Sit to Supine;Rolling Rolling: Mod assist   Supine to sit: Mod assist;+2 for physical assistance;HOB elevated Sit to supine: Mod assist;+2 for physical assistance   General bed mobility comments: Able to bring LEs mostly to EOB, assist with trunk and scooting bottom to get to EOB. Assist to bring LEs into bed. Rolling to left to adjust pads with Mod A and rail.  Transfers Overall transfer level: Needs assistance Equipment used: Rolling walker (2 wheeled) Transfers: Sit to/from Stand Sit to Stand: Mod assist;+2 physical assistance;From elevated surface         General transfer comment: mod assist +2 to power up and stabilize, cueing for hand placement and sequencing. Cues for upright posture.  Ambulation/Gait Ambulation/Gait assistance: Mod assist;+2 physical assistance Gait Distance (Feet): 2 Feet Assistive device: Rolling walker  (2 wheeled) Gait Pattern/deviations: Shuffle;Trunk flexed Gait velocity: decreased   General Gait Details: Able to take 2-3 steps along side bed with bil knee instability, flexed trunk/hips. HR up to 137 bpm, A-fib.   Stairs             Wheelchair Mobility    Modified Rankin (Stroke Patients Only)       Balance Overall balance assessment: Needs assistance Sitting-balance support: Feet supported;No upper extremity supported Sitting balance-Leahy Scale: Fair Sitting balance - Comments: min guard to close supervision seated EOB leaning on forearms for support.   Standing balance support: Bilateral upper extremity supported;During functional activity Standing balance-Leahy Scale: Poor Standing balance comment: reliant on BUE and external support, cueing for upright positioning                             Cognition Arousal/Alertness: Awake/alert Behavior During Therapy: WFL for tasks assessed/performed Overall Cognitive Status: Within Functional Limits for tasks assessed                                 General Comments: appears [facility]       Exercises      General Comments General comments (skin integrity, edema, etc.): HR up to 137 bpm with standing on 2L/min 02. Bp 123/70.      Pertinent Vitals/Pain Pain Assessment: Faces Faces Pain Scale: Hurts little more Pain Location: left side Pain Descriptors / Indicators: Sore;Aching;Guarding Pain Intervention(s): Repositioned;Monitored  during session;Limited activity within patient's tolerance;Patient requesting pain meds-RN notified    Home Living Family/patient expects to be discharged to:: Skilled nursing facility Living Arrangements: Alone Available Help at Discharge: Friend(s);Available PRN/intermittently Type of Home: House Home Access: Level entry   Home Layout: Two level;Able to live on main level with bedroom/bathroom Home Equipment: Gilford Rile - 2 wheels;Shower seat - built in;Cane -  single point;Wheelchair - manual      Prior Function Level of Independence: Needs assistance  Gait / Transfers Assistance Needed: Uses RW for ambulation, reports difficulty with this a few days prior to fall. "I can't even walkt o my mail box due to weakness." ADL's / Homemaking Assistance Needed: Does his own ADLs. Reports he has 2 caregivers that call everyday and one comes twice/week and the other comes when he calls. They assist with shopping and IADLs. Comments: Reports 3 falls in last 6 months.   PT Goals (current goals can now be found in the care plan section) Acute Rehab PT Goals Patient Stated Goal: to feel better and get some rest  Progress towards PT goals: Progressing toward goals(slowly)    Frequency    Min 2X/week      PT Plan Frequency needs to be updated    Co-evaluation PT/OT/SLP Co-Evaluation/Treatment: Yes Reason for Co-Treatment: Complexity of the patient's impairments (multi-system involvement);For patient/therapist safety;To address functional/ADL transfers PT goals addressed during session: Mobility/safety with mobility        AM-PAC PT "6 Clicks" Mobility   Outcome Measure  Help needed turning from your back to your side while in a flat bed without using bedrails?: A Lot Help needed moving from lying on your back to sitting on the side of a flat bed without using bedrails?: A Lot Help needed moving to and from a bed to a chair (including a wheelchair)?: A Lot Help needed standing up from a chair using your arms (e.g., wheelchair or bedside chair)?: A Lot Help needed to walk in hospital room?: A Lot Help needed climbing 3-5 steps with a railing? : Total 6 Click Score: 11    End of Session Equipment Utilized During Treatment: Oxygen;Gait belt Activity Tolerance: Patient limited by fatigue Patient left: in bed;with call bell/phone within reach;with bed alarm set Nurse Communication: Mobility status PT Visit Diagnosis: Muscle weakness (generalized)  (M62.81);Difficulty in walking, not elsewhere classified (R26.2);Unsteadiness on feet (R26.81);Repeated falls (R29.6)     Time: [id number] PT Time Calculation (min) (ACUTE ONLY): 26 min  Charges:  $Therapeutic Activity: 8-22 mins                     Wray Kearns, PT, DPT Acute Rehabilitation Services Pager [phone] Office [place] 09/20/2019, 12:27 PM

## 2019-09-20 NOTE — Progress Notes (Signed)
Discussed results of swallow study with patient and explained aspiration risks. We discussed goals of care and risks of eating despite aspiration, likely chronic. Patient does not wish to have a feeding tube placed. He feels that it is appropriate to continue to eat while maximizing efforts for safe swallowing at this time. I discussed with him that I feel this is an appropriate choice for his age.   Brigid Re , [facility] 09/20/2019, 12:38 PM Please see Amion for pager number during day hours 7:00am-4:30pm

## 2019-09-20 NOTE — Progress Notes (Addendum)
Spoke with Donavan Burnet friend of patient who called to get an update on patient. Advise patient is A&O currently working with OT & PT.

## 2019-09-20 NOTE — Progress Notes (Signed)
[facility] Progress Note     Subjective: CC: wheezing Patient with some coughing and wheezing this AM. Has been using flutter valve, not IS as much. Coughing and using suction to get phlegm out. Alert and oriented. Asking about going home, discussed plans for SNF at discharge and patient agreeable. Tolerating diet, +flatus, has had some small BM.   Objective: Vital signs in last 24 hours: Temp:  [90 F (32.2 C)-98.7 F (37.1 C)] 97.4 F (36.3 C) (10/19 0400) Pulse Rate:  [67-99] 91 (10/19 0400) Resp:  [18-30] 27 (10/19 0400) BP: (90-119)/(51-74) 119/69 (10/19 0400) SpO2:  [95 %-100 %] 100 % (10/19 0400) Last BM Date: 09/19/19  Intake/Output from previous day: 10/18 0701 - 10/19 0700 In: 700 [P.O.:700] Out: 650 [Urine:650] Intake/Output this shift: No intake/output data recorded.  PE: Gen: Alert, NAD, pleasant Card:RRR, pedal pulses 2+ BL Pulm: Normal effort, expiratory wheezing bilaterally, junky sounding cough, pulled 500-750 on IS Abd: Soft, non-tender, non-distended,+BS Skin: warm and dry, no rashes  Neuro: A&Ox4, speech clear  Lab Results:  Recent Labs    09/19/19 0239 09/20/19 0226  WBC 13.7* 11.6*  HGB 11.0* 10.7*  HCT 33.2* 32.2*  PLT 181 187   BMET Recent Labs    09/19/19 0239 09/20/19 0226  NA 136 136  K 4.6 3.5  CL 107 108  CO2 19* 21*  GLUCOSE 147* 132*  BUN 42* 40*  CREATININE 2.54* 2.24*  CALCIUM 8.0* 7.9*   PT/INR No results for input(s): LABPROT, INR in the last 72 hours. CMP     Component Value Date/Time   NA 136 09/20/2019 0226   K 3.5 09/20/2019 0226   CL 108 09/20/2019 0226   CO2 21 (L) 09/20/2019 0226   GLUCOSE 132 (H) 09/20/2019 0226   BUN 40 (H) 09/20/2019 0226   CREATININE 2.24 (H) 09/20/2019 0226   CALCIUM 7.9 (L) 09/20/2019 0226   PROT 6.2 (L) 09/15/2019 0922   ALBUMIN 3.1 (L) 09/15/2019 0922   AST 31 09/15/2019 0922   ALT 15 09/15/2019 0922   ALKPHOS 51 09/15/2019 0922   BILITOT 1.2 09/15/2019  0922   GFRNONAA 24 (L) 09/20/2019 0226   GFRAA 28 (L) 09/20/2019 0226   Lipase  No results found for: LIPASE     Studies/Results: No results found.  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 SNF pending   LOS: 5 days    Brigid Re , [facility] 09/20/2019, 7:56 AM Please see Amion for pager number during day hours 7:00am-4:30pm

## 2019-09-20 NOTE — TOC Progression Note (Signed)
Transition of Care [facility]) - Progression Note    Patient Details  Name: [name] [name] MRN: [id number] Date of Birth: 01-31-1923  Transition of Care [facility]) CM/SW [place], [place] Phone Number: [phone] 09/20/2019, 2:33 PM  Clinical Narrative:     CSW met with patient to provide bed offers, patient appeared unable to retain or understand information on facilities offered to him. With assistance from RN, patient provided verbal release to speak with [name] [name] and [name] [name] for facility choice.   CSW called [name] [name] who reports she chooses [facility], and she also has [employer] phone number at [phone]. CSW spoke with [name] [name] who reports he has been making most of the decisions for patient's care, however patient does have a son who lives in [place], [place] who [name] [name] reports would need to approve of patient going to [facility]. Patient's son's name is [name] [name] and can be reached at [phone].   CSW lvm with patient's son [name] [name]. Pending response for approval of SNF choice and to move forward with [facility].   Expected Discharge Plan: [place] Barriers to Discharge: Continued Medical Work up, Ship broker  Expected Discharge Plan and Services Expected Discharge Plan: [place] In-house Referral: Clinical Social Work   Post Acute Care Choice: [place] Living arrangements for the past 2 months: Single Family Home                                       Social Determinants of Health (SDOH) Interventions    Readmission Risk Interventions No flowsheet data found.

## 2019-09-20 NOTE — Care Management Important Message (Signed)
Important Message  Patient Details  Name: [name] [name] MRN: [id number] Date of Birth: 1923-01-14   Medicare Important Message Given:  Yes     Memory Argue 09/20/2019, 3:50 PM

## 2019-09-20 NOTE — Evaluation (Signed)
Occupational Therapy Evaluation Patient Details Name: [name] [name] MRN: [id number] DOB: 05-Feb-1923 Today's Date: 09/20/2019    History of Present Illness Patient is a 85 y/o male who presents with left rib fxs 7-11 and left renal laceration s/p IR embolization 10/14 after a fall at home. PMH includes HTN, A-fib and colon ca.   Clinical Impression   PTA patient independent with ADLs, using RW for mobility, limited iADLs (has caregivers, see below for details).  Admitted for above and limited by problem list below, including impaired balance, decreased activity tolerance, pain, generalized weakness, and impaired balance.  He currently requires min assist for grooming at EOB, mod assist for UB ADLs, max-total assist +2 for LB ADLs, mod assist +2 for bed mobility and sit to stand transfers.   He will benefit from further OT services while admitted and after dc at [facility] level in order to optimize return to PLOF with ADLs and mobility.  Will need 24/7 physical support at this time.      Follow Up Recommendations  SNF;Supervision/Assistance - 24 hour    Equipment Recommendations  Other (comment)(TBD at next venue of care)    Recommendations for Other Services       Precautions / Restrictions Precautions Precautions: Fall Precaution Comments: watch 02 Restrictions Weight Bearing Restrictions: No      Mobility Bed Mobility Overal bed mobility: Needs Assistance Bed Mobility: Supine to Sit     Supine to sit: Mod assist;+2 for physical assistance;HOB elevated     General bed mobility comments: Able to bring LEs mostly to EOB, assist with trunk and scooting bottom to get to EOB.  Transfers Overall transfer level: Needs assistance Equipment used: Rolling walker (2 wheeled) Transfers: Sit to/from Stand Sit to Stand: Mod assist;+2 physical assistance;From elevated surface         General transfer comment: mod assist +2 to power up and stabilize, cueing for hand placement and sequencing      Balance Overall balance assessment: Needs assistance Sitting-balance support: Feet supported;No upper extremity supported Sitting balance-Leahy Scale: Fair Sitting balance - Comments: min guard to close supervision seated EOB    Standing balance support: Bilateral upper extremity supported;During functional activity Standing balance-Leahy Scale: Poor Standing balance comment: reliant on BUE and external support, cueing for upright positioning                            ADL either performed or assessed with clinical judgement   ADL Overall ADL's : Needs assistance/impaired     Grooming: Minimal assistance;Sitting Grooming Details (indicate cue type and reason): sitting EOB requires support for proximal weakness to engage in washing face and combing hair  Upper Body Bathing: Moderate assistance;Sitting   Lower Body Bathing: Total assistance;+2 for physical assistance;Sit to/from stand   Upper Body Dressing : Maximal assistance;Sitting   Lower Body Dressing: Total assistance;+2 for physical assistance;Sit to/from Health and safety inspector Details (indicate cue type and reason): deferred, mod assist +2 sit to stand and side stepping towards [facility]          Functional mobility during ADLs: Moderate assistance;+2 for physical assistance;+2 for safety/equipment General ADL Comments: pt limited by generalized weakness, pain, decreased activity tolerance     Vision Baseline Vision/History: Macular Degeneration Patient Visual Report: No change from baseline       Perception     Praxis      Pertinent Vitals/Pain Pain Assessment: Faces Faces Pain Scale: Hurts  little more Pain Location: left side Pain Descriptors / Indicators: Sore;Aching;Guarding Pain Intervention(s): Limited activity within patient's tolerance;Monitored during session;Patient requesting pain meds-RN notified;Repositioned     Hand Dominance Right   Extremity/Trunk Assessment Upper Extremity  Assessment Upper Extremity Assessment: Generalized weakness;RUE deficits/detail;LUE deficits/detail RUE Deficits / Details: limited FF to 90 degrees (reports recent fall on shoulder)  LUE Deficits / Details: AAROM WFL, limited by pain in ribs    Lower Extremity Assessment Lower Extremity Assessment: Defer to PT evaluation   Cervical / Trunk Assessment Cervical / Trunk Assessment: Kyphotic   Communication Communication Communication: HOH   Cognition Arousal/Alertness: Awake/alert Behavior During Therapy: WFL for tasks assessed/performed Overall Cognitive Status: Within Functional Limits for tasks assessed                                 General Comments: appears WFL    General Comments  VSS, HR up to 128 with standing EOB, on 2L [place]    Exercises     Shoulder Instructions      Home Living Family/patient expects to be discharged to:: Skilled nursing facility Living Arrangements: Alone Available Help at Discharge: Friend(s);Available PRN/intermittently Type of Home: House Home Access: Level entry     Home Layout: Two level;Able to live on main level with bedroom/bathroom     Bathroom Shower/Tub: Walk-in shower   Bathroom Toilet: Handicapped height     Home Equipment: Environmental consultant - 2 wheels;Shower seat - built in;Cane - single point;Wheelchair - manual          Prior Functioning/Environment Level of Independence: Needs assistance  Gait / Transfers Assistance Needed: Uses RW for ambulation, reports difficulty with this a few days prior to fall. "I can't even walkt o my mail box due to weakness." ADL's / Homemaking Assistance Needed: Does his own ADLs. Reports he has 2 caregivers that call everyday and one comes twice/week and the other comes when he calls. They assist with shopping and IADLs.   Comments: Reports 3 falls in last 6 months.        OT Problem List: Decreased strength;Decreased activity tolerance;Impaired balance (sitting and/or  standing);Decreased range of motion;Decreased coordination;Cardiopulmonary status limiting activity;Decreased knowledge of precautions;Decreased knowledge of use of DME or AE;Obesity;Impaired UE functional use;Pain;Increased edema      OT Treatment/Interventions: Self-care/ADL training;DME and/or AE instruction;Therapeutic activities;Balance training;Patient/family education    OT Goals(Current goals can be found in the care plan section) Acute Rehab OT Goals Patient Stated Goal: to feel better and get some rest  OT Goal Formulation: With patient Time For Goal Achievement: 10/04/19 Potential to Achieve Goals: Good  OT Frequency: Min 2X/week   Barriers to D/C:            Co-evaluation              AM-PAC OT "6 Clicks" Daily Activity     Outcome Measure Help from another person eating meals?: A Little Help from another person taking care of personal grooming?: A Little Help from another person toileting, which includes using toliet, bedpan, or urinal?: Total Help from another person bathing (including washing, rinsing, drying)?: A Lot Help from another person to put on and taking off regular upper body clothing?: A Lot Help from another person to put on and taking off regular lower body clothing?: Total 6 Click Score: 12   End of Session Equipment Utilized During Treatment: Rolling walker;Oxygen(2L) Nurse Communication: Mobility status  Activity Tolerance:  Patient limited by fatigue Patient left: in bed;with call bell/phone within reach;with bed alarm set  OT Visit Diagnosis: Other abnormalities of gait and mobility (R26.89);Muscle weakness (generalized) (M62.81);Pain;History of falling (Z91.81) Pain - Right/Left: Left Pain - part of body: (ribs)                Time: [id number] OT Time Calculation (min): 27 min Charges:  OT General Charges $OT Visit: 1 Visit OT Evaluation $OT Eval Moderate Complexity: 1 [place], OT Acute Rehabilitation Services Pager  [phone] Office [phone]   Delight Stare 09/20/2019, 10:42 AM

## 2019-09-21 LAB — CBC
HCT: 32.7 % — ABNORMAL LOW (ref 39.0–52.0)
Hemoglobin: 10.4 g/dL — ABNORMAL LOW (ref 13.0–17.0)
MCH: 31.1 pg (ref 26.0–34.0)
MCHC: 31.8 g/dL (ref 30.0–36.0)
MCV: 97.9 fL (ref 80.0–100.0)
Platelets: 220 10*3/uL (ref 150–400)
RBC: 3.34 MIL/uL — ABNORMAL LOW (ref 4.22–5.81)
RDW: 14.4 % (ref 11.5–15.5)
WBC: 9.2 10*3/uL (ref 4.0–10.5)
nRBC: 0 % (ref 0.0–0.2)

## 2019-09-21 LAB — BASIC METABOLIC PANEL
Anion gap: 9 (ref 5–15)
BUN: 38 mg/dL — ABNORMAL HIGH (ref 8–23)
CO2: 21 mmol/L — ABNORMAL LOW (ref 22–32)
Calcium: 8.1 mg/dL — ABNORMAL LOW (ref 8.9–10.3)
Chloride: 106 mmol/L (ref 98–111)
Creatinine, Ser: 1.96 mg/dL — ABNORMAL HIGH (ref 0.61–1.24)
GFR calc Af Amer: 33 mL/min — ABNORMAL LOW (ref >60)
GFR calc non Af Amer: 28 mL/min — ABNORMAL LOW (ref >60)
Glucose, Bld: 113 mg/dL — ABNORMAL HIGH (ref 70–99)
Potassium: 3.1 mmol/L — ABNORMAL LOW (ref 3.5–5.1)
Sodium: 136 mmol/L (ref 135–145)

## 2019-09-21 LAB — PHOSPHORUS: Phosphorus: 3.4 mg/dL (ref 2.5–4.6)

## 2019-09-21 LAB — MAGNESIUM: Magnesium: 2 mg/dL (ref 1.7–2.4)

## 2019-09-21 MED ORDER — POTASSIUM CHLORIDE 10 MEQ/100ML IV SOLN
10.0000 meq | INTRAVENOUS | Status: AC
  Administered 2019-09-21 (×3): 10 meq via INTRAVENOUS
  Filled 2019-09-21 (×3): qty 100

## 2019-09-21 MED ORDER — FUROSEMIDE 10 MG/ML IJ SOLN
20.0000 mg | Freq: Once | INTRAMUSCULAR | Status: AC
  Administered 2019-09-21: 09:00:00 20 mg via INTRAVENOUS
  Filled 2019-09-21: qty 2

## 2019-09-21 MED ORDER — IPRATROPIUM-ALBUTEROL 0.5-2.5 (3) MG/3ML IN SOLN
3.0000 mL | Freq: Two times a day (BID) | RESPIRATORY_TRACT | Status: DC
  Administered 2019-09-21 – 2019-09-22 (×2): 3 mL via RESPIRATORY_TRACT
  Filled 2019-09-21 (×2): qty 3

## 2019-09-21 MED ORDER — POTASSIUM CHLORIDE CRYS ER 20 MEQ PO TBCR
20.0000 meq | EXTENDED_RELEASE_TABLET | Freq: Three times a day (TID) | ORAL | Status: AC
  Administered 2019-09-21 – 2019-09-22 (×4): 20 meq via ORAL
  Filled 2019-09-21 (×4): qty 1

## 2019-09-21 NOTE — Progress Notes (Signed)
Patient sister Ms. Nickola Major called requesting information on the patient current condition. Advise sister that to release information she must be listed as a contact on the record. Advise patient would transfer to patient room and she could speak directly with him. RN transferred call to patients room and personally made sure patient answered phone.

## 2019-09-21 NOTE — Progress Notes (Signed)
Pt's BP 103/54(68) HR 100. Held Lasix. Paged doctor.

## 2019-09-21 NOTE — TOC Progression Note (Addendum)
Transition of Care [facility]) - Progression Note    Patient Details  Name: Kahiau Wison MRN: [id number] Date of Birth: 1923/03/17  Transition of Care [facility] CM/SW [place], [place] Phone Number: [phone] 09/21/2019, 1:54 PM  Clinical Narrative:     Update: Merry Proud patient's son called CSW back and reports he trusts [name] and his decisions and would like [name] to choose SNF, CSW called [name] and lvm letting him know that Merry Proud would like him to choose SNF based on bed offer list. Pending call back.   CSW received vm from patient's son Merry Proud, CSW called him back for bed choice and no answer lvm. CSW included bed offers in vm requesting choice, also informed him that [facility] is no longer accepting new patients due to covid cases at this time. Pending call back.   Expected Discharge Plan: [place] Barriers to Discharge: Continued Medical Work up, Ship broker  Expected Discharge Plan and Services Expected Discharge Plan: [place] In-house Referral: Clinical Social Work   Post Acute Care Choice: [place] Living arrangements for the past 2 months: Single Family Home                                       Social Determinants of Health (SDOH) Interventions    Readmission Risk Interventions No flowsheet data found.

## 2019-09-21 NOTE — Progress Notes (Signed)
[facility] Progress Note     Subjective: CC: no complaints Patient pain well controlled. Coughing but able to get phlegm up. Tolerating diet and having bowel function. Working on SNF placement.  Objective: Vital signs in last 24 hours: Temp:  [96.4 F (35.8 C)-97.9 F (36.6 C)] 97.7 F (36.5 C) (10/20 0735) Pulse Rate:  [88-119] 95 (10/20 0735) Resp:  [17-32] 25 (10/20 0735) BP: (94-123)/(47-77) 94/61 (10/20 0735) SpO2:  [99 %-100 %] 100 % (10/20 0735) Last BM Date: 09/13/19  Intake/Output from previous day: 10/19 0701 - 10/20 0700 In: 3112.2 [P.O.:236; I.V.:2776.2; IV Piggyback:100] Out: 2050 [Urine:2050] Intake/Output this shift: Total I/O In: -  Out: 400 [Urine:400]  PE: Gen: Alert, NAD, pleasant Card:RRR, pedal pulses 2+ BL, mild pedal edema bilaterally  Pulm: Normal effort, lungs CTAB peripherally, junky sounding cough, pulled 500-750 on IS Abd: Soft, non-tender, non-distended,+BS Skin: warm and dry, no rashes  Neuro: A&Ox4, speech clear   Lab Results:  Recent Labs    09/20/19 0226 09/21/19 0224  WBC 11.6* 9.2  HGB 10.7* 10.4*  HCT 32.2* 32.7*  PLT 187 220   BMET Recent Labs    09/20/19 0226 09/21/19 0224  NA 136 136  K 3.5 3.1*  CL 108 106  CO2 21* 21*  GLUCOSE 132* 113*  BUN 40* 38*  CREATININE 2.24* 1.96*  CALCIUM 7.9* 8.1*   PT/INR No results for input(s): LABPROT, INR in the last 72 hours. CMP     Component Value Date/Time   NA 136 09/21/2019 0224   K 3.1 (L) 09/21/2019 0224   CL 106 09/21/2019 0224   CO2 21 (L) 09/21/2019 0224   GLUCOSE 113 (H) 09/21/2019 0224   BUN 38 (H) 09/21/2019 0224   CREATININE 1.96 (H) 09/21/2019 0224   CALCIUM 8.1 (L) 09/21/2019 0224   PROT 6.2 (L) 09/15/2019 0922   ALBUMIN 3.1 (L) 09/15/2019 0922   AST 31 09/15/2019 0922   ALT 15 09/15/2019 0922   ALKPHOS 51 09/15/2019 0922   BILITOT 1.2 09/15/2019 0922   GFRNONAA 28 (L) 09/21/2019 0224   GFRAA 33 (L) 09/21/2019 0224   Lipase   No results found for: LIPASE     Studies/Results: Dg Swallowing Func-speech Pathology  Result Date: 09/20/2019 Objective Swallowing Evaluation: Type of Study: MBS-Modified Barium Swallow Study  Patient Details Name: [name] [name] MRN: [id number] Date of Birth: [birth year] Today's Date: 09/20/2019 Time: SLP Start Time (ACUTE ONLY): 1100 -SLP Stop Time (ACUTE ONLY): 1120 SLP Time Calculation (min) (ACUTE ONLY): 20 min Past Medical History: Past Medical History: Diagnosis Date . Arthritis  . Atrial fibrillation ([place])  . Cancer [facility])   colon ca - surgery, no chemo or radiation . Hypertension  . Right shoulder pain 09/30/12  recently Past Surgical History: Past Surgical History: Procedure Laterality Date . CATARACT EXTRACTION W/ INTRAOCULAR LENS IMPLANT    b/l . colon surgery for colon cancer  approx. 2006 . IR EMBO ART  VEN HEMORR LYMPH EXTRAV  INC GUIDE ROADMAPPING  09/15/2019 . IR FLUORO GUIDE CV LINE RIGHT  09/15/2019 . IR RENAL SUPRASEL UNI S&I MOD SED  09/15/2019 . IR US GUIDE VASC ACCESS RIGHT  09/15/2019 . IR US GUIDE VASC ACCESS RIGHT  09/15/2019 HPI: Pt is a 85 yo male admitted s/p fall with L rib fxs 7-11 and L kidney lac s/p IR emobolization 10/14. Pt was advancing his diet but developed increased SOB. CXR 10/16 showed bibasilar opacities, likely atelectasis. SLP swallow evaluation was ordered. PMH includes: HTN,  colon cancer, Afib, arthritis, and self-reported GER  Subjective: pt alert, pleasant, SOB Assessment / Plan / Recommendation CHL IP CLINICAL IMPRESSIONS 09/20/2019 Clinical Impression Pt has a moderate oropharyngeal and cervical esophageal dysphagia, felt to be acute on chronic in nature. He has delayed oral transit with reduced bolus cohesion and mild oral residuals, suspect related to acute deconditioning. His oral residue often spills posteriorly to the valleculae, but he clears oral residue well with a second swallow. Small amounts of penetration occur during the swallow with thin liquids,  but most of his penetration and aspiration occur after the swallow. His UES appears to be tight, with boluses partially entering his esophagus but then backflowing up into the pharynx. This backflow often spills posteriorly over the arytenoids and into the larynx. It is difficult to visualize beyond the vocal folds, but silent aspiration was clearly observed with nectar thick liquids and highly suspected with other consistencies. He had the least amount of pharyngeal clearance with soft solids, which were repeatedly penetrated, cleared with reflexive swallows, and then unfortunately repenetrated. Discussed with trauma PA my concern for aspiration across meals regardless of textures, which is likely not acute given what appears to be a primary esophageal source, but he is more likely to develop complications in his deconditioned state. Pt says that he has had his esophagus stretched in the past. Could consider GI involvement. Pt may be appropriate for comfort feeds, accepting the risk of aspiration but trying to reduce it as much as possible with small, single bites/sips of soft purees and thin liquids (although of course if pt is fully accepting of aspiration risk, he could try more solid foods as well). Thorough and frequent oral care would be important. Would use aspiration and esophageal precautions and provide assistance with careful hand feeding. Per PA, she and/or MD will discuss with pt further before making any decisions. Will continue to follow.  SLP Visit Diagnosis Dysphagia, pharyngoesophageal phase (R13.14) Attention and concentration deficit following -- Frontal lobe and executive function deficit following -- Impact on safety and function Moderate aspiration risk;Risk for inadequate nutrition/hydration   CHL IP TREATMENT RECOMMENDATION 09/20/2019 Treatment Recommendations Therapy as outlined in treatment plan below   Prognosis 09/20/2019 Prognosis for Safe Diet Advancement Fair Barriers to Reach Goals  Time post onset Barriers/Prognosis Comment -- CHL IP DIET RECOMMENDATION 09/20/2019 SLP Diet Recommendations Other (Comment) Liquid Administration via -- Medication Administration Crushed with puree Compensations -- Postural Changes --   CHL IP OTHER RECOMMENDATIONS 09/20/2019 Recommended Consults Consider GI evaluation Oral Care Recommendations Oral care QID Other Recommendations Have oral suction available   CHL IP FOLLOW UP RECOMMENDATIONS 09/20/2019 Follow up Recommendations 24 hour supervision/assistance   CHL IP FREQUENCY AND DURATION 09/20/2019 Speech Therapy Frequency (ACUTE ONLY) min 2x/week Treatment Duration 2 weeks      CHL IP ORAL PHASE 09/20/2019 Oral Phase Impaired Oral - Pudding Teaspoon -- Oral - Pudding Cup -- Oral - Honey Teaspoon -- Oral - Honey Cup -- Oral - Nectar Teaspoon -- Oral - Nectar Cup -- Oral - Nectar Straw Decreased bolus cohesion;Delayed oral transit;Lingual/palatal residue;Weak lingual manipulation;Reduced posterior propulsion Oral - Thin Teaspoon -- Oral - Thin Cup Decreased bolus cohesion;Delayed oral transit;Lingual/palatal residue;Weak lingual manipulation;Reduced posterior propulsion Oral - Thin Straw Decreased bolus cohesion;Delayed oral transit;Lingual/palatal residue;Weak lingual manipulation;Reduced posterior propulsion Oral - Puree Decreased bolus cohesion;Delayed oral transit;Lingual/palatal residue;Weak lingual manipulation;Reduced posterior propulsion Oral - Mech Soft Decreased bolus cohesion;Delayed oral transit;Lingual/palatal residue;Weak lingual manipulation;Reduced posterior propulsion Oral - Regular -- Oral - Multi-Consistency --  Oral - Pill -- Oral Phase - Comment --  CHL IP PHARYNGEAL PHASE 09/20/2019 Pharyngeal Phase Impaired Pharyngeal- Pudding Teaspoon -- Pharyngeal -- Pharyngeal- Pudding Cup -- Pharyngeal -- Pharyngeal- Honey Teaspoon -- Pharyngeal -- Pharyngeal- Honey Cup -- Pharyngeal -- Pharyngeal- Nectar Teaspoon -- Pharyngeal -- Pharyngeal- Nectar Cup  -- Pharyngeal -- Pharyngeal- Nectar Straw Penetration/Apiration after swallow;Pharyngeal residue - cp segment;Pharyngeal residue - pyriform Pharyngeal Material enters airway, passes BELOW cords without attempt by patient to eject out (silent aspiration) Pharyngeal- Thin Teaspoon -- Pharyngeal -- Pharyngeal- Thin Cup Penetration/Aspiration during swallow;Penetration/Apiration after swallow;Pharyngeal residue - cp segment;Pharyngeal residue - pyriform Pharyngeal Material enters airway, CONTACTS cords and not ejected out Pharyngeal- Thin Straw Penetration/Aspiration during swallow;Penetration/Apiration after swallow;Pharyngeal residue - cp segment;Pharyngeal residue - pyriform Pharyngeal Material enters airway, CONTACTS cords and not ejected out Pharyngeal- Puree Penetration/Apiration after swallow;Pharyngeal residue - cp segment;Pharyngeal residue - pyriform Pharyngeal Material enters airway, remains ABOVE vocal cords and not ejected out Pharyngeal- Mechanical Soft Penetration/Apiration after swallow;Pharyngeal residue - cp segment;Pharyngeal residue - pyriform;Penetration/Aspiration during swallow Pharyngeal Material enters airway, remains ABOVE vocal cords and not ejected out Pharyngeal- Regular -- Pharyngeal -- Pharyngeal- Multi-consistency -- Pharyngeal -- Pharyngeal- Pill -- Pharyngeal -- Pharyngeal Comment --  CHL IP CERVICAL ESOPHAGEAL PHASE 09/20/2019 Cervical Esophageal Phase Impaired Pudding Teaspoon -- Pudding Cup -- Honey Teaspoon -- Honey Cup -- Nectar Teaspoon -- Nectar Cup -- Nectar Straw Reduced cricopharyngeal relaxation;Esophageal backflow into the pharynx;Esophageal backflow into the larynx Thin Teaspoon -- Thin Cup Reduced cricopharyngeal relaxation;Esophageal backflow into the pharynx;Esophageal backflow into the larynx Thin Straw Reduced cricopharyngeal relaxation;Esophageal backflow into the pharynx;Esophageal backflow into the larynx Puree Reduced cricopharyngeal relaxation;Esophageal backflow  into the pharynx Mechanical Soft Reduced cricopharyngeal relaxation;Esophageal backflow into the pharynx;Esophageal backflow into the larynx Regular -- Multi-consistency -- Pill -- Cervical Esophageal Comment -- Venita Sheffield Nix 09/20/2019, 9:40 AM  Pollyann Glen, M.A. [place] Acute Rehabilitation Services Pager [phone] Office [phone]              Anti-infectives: Anti-infectives (From admission, onward)   None       Assessment/Plan Fall L rib fxs 7-11- pain control and pulm toilet, IS, follow up CXR has been stable L kidney lac with possible active extravasation and large hematoma -s/p IR embolization 10/14 - Cr down slightly this AM 1.96, urine much clearer  ABL anemia-hgb 10.4, stable Atrial fibrillation- on xarelto (last dose 10/13 in PM) - hold xarelto, resumed other cardiac meds Chronic aspiration - discussed with patient, maximize efforts to minimize aspiration but feed for comfort HTN HLD H/o colon cancer s/p partial colectomy ~2006 Ventral hernia 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 SNF pending   LOS: 6 days    Brigid Re , [facility] Surgery 09/21/2019, 7:52 AM Please see Amion for pager number during day hours 7:00am-4:30pm

## 2019-09-21 NOTE — Progress Notes (Signed)
  Speech Language Pathology Treatment: Dysphagia  Patient Details Name: [name] [name] MRN: [id number] DOB: 11-22-1923 Today's Date: 09/21/2019 Time: [id number] SLP Time Calculation (min) (ACUTE ONLY): 17 min  Assessment / Plan / Recommendation Clinical Impression  Per chart review and discussion with pt, after speaking with PA, he has decided to continue with Dys 2 diet and thin liquids with known aspiration risk (note that he is full code). He again said to me that given his age, he would rather not pursue alternatives such as a feeding tube or other interventions. I did share that we could provide some dysphagia therapy to overcome acute deconditioning of swallow and cough to maximize function, and he was interested in possibly pursuing this at SNF. He was eating breakfast at the time of my visit and exhibited delayed coughing, suspected to be indicative of late aspiration (or late sensation) post-prandially. Will continue current diet for comfort - SLP did provide education about strategies to reduce but not eliminate his risk. Also provided him with his dentures so that he can more finely masticate his food. Will continue to follow briefly.    HPI HPI: Pt is a 85 yo male admitted s/p fall with L rib fxs 7-11 and L kidney lac s/p IR emobolization 10/14. Pt was advancing his diet but developed increased SOB. CXR 10/16 showed bibasilar opacities, likely atelectasis. SLP swallow evaluation was ordered. PMH includes: HTN, colon cancer, Afib, arthritis, and self-reported GER      SLP Plan  Continue with current plan of care       Recommendations  Diet recommendations: Dysphagia 2 (fine chop);Thin liquid(accepting aspiration risk) Liquids provided via: Cup Medication Administration: Crushed with puree Supervision: Patient able to self feed;Intermittent supervision to cue for compensatory strategies Compensations: Slow rate;Small sips/bites;Multiple dry swallows after each bite/sip Postural Changes  and/or Swallow Maneuvers: Seated upright 90 degrees;Upright 30-60 min after meal                Oral Care Recommendations: Oral care QID Follow up Recommendations: Skilled Nursing facility SLP Visit Diagnosis: Dysphagia, pharyngoesophageal phase (R13.14) Plan: Continue with current plan of care       GO                Venita Sheffield Kenyetta Wimbish 09/21/2019, 9:12 AM  Pollyann Glen, M.A. [place] Acute Environmental education officer [phone] Office [phone]

## 2019-09-22 LAB — CBC
HCT: 29.8 % — ABNORMAL LOW (ref 39.0–52.0)
Hemoglobin: 10.2 g/dL — ABNORMAL LOW (ref 13.0–17.0)
MCH: 32.4 pg (ref 26.0–34.0)
MCHC: 34.2 g/dL (ref 30.0–36.0)
MCV: 94.6 fL (ref 80.0–100.0)
Platelets: 234 10*3/uL (ref 150–400)
RBC: 3.15 MIL/uL — ABNORMAL LOW (ref 4.22–5.81)
RDW: 14.5 % (ref 11.5–15.5)
WBC: 9.2 10*3/uL (ref 4.0–10.5)
nRBC: 0 % (ref 0.0–0.2)

## 2019-09-22 LAB — BASIC METABOLIC PANEL
Anion gap: 8 (ref 5–15)
BUN: 35 mg/dL — ABNORMAL HIGH (ref 8–23)
CO2: 22 mmol/L (ref 22–32)
Calcium: 8.1 mg/dL — ABNORMAL LOW (ref 8.9–10.3)
Chloride: 108 mmol/L (ref 98–111)
Creatinine, Ser: 1.8 mg/dL — ABNORMAL HIGH (ref 0.61–1.24)
GFR calc Af Amer: 37 mL/min — ABNORMAL LOW (ref >60)
GFR calc non Af Amer: 32 mL/min — ABNORMAL LOW (ref >60)
Glucose, Bld: 107 mg/dL — ABNORMAL HIGH (ref 70–99)
Potassium: 3.8 mmol/L (ref 3.5–5.1)
Sodium: 138 mmol/L (ref 135–145)

## 2019-09-22 LAB — GLUCOSE, CAPILLARY: Glucose-Capillary: 142 mg/dL — ABNORMAL HIGH (ref 70–99)

## 2019-09-22 LAB — PHOSPHORUS: Phosphorus: 2 mg/dL — ABNORMAL LOW (ref 2.5–4.6)

## 2019-09-22 LAB — MAGNESIUM: Magnesium: 1.8 mg/dL (ref 1.7–2.4)

## 2019-09-22 MED ORDER — FUROSEMIDE 10 MG/ML IJ SOLN
20.0000 mg | Freq: Once | INTRAMUSCULAR | Status: AC
  Administered 2019-09-22: 20 mg via INTRAVENOUS
  Filled 2019-09-22: qty 2

## 2019-09-22 MED ORDER — POTASSIUM CHLORIDE 20 MEQ/15ML (10%) PO SOLN
40.0000 meq | Freq: Once | ORAL | Status: AC
  Administered 2019-09-22: 40 meq via ORAL
  Filled 2019-09-22: qty 30

## 2019-09-22 MED ORDER — K PHOS MONO-SOD PHOS DI & MONO 155-852-130 MG PO TABS
250.0000 mg | ORAL_TABLET | Freq: Two times a day (BID) | ORAL | Status: AC
  Administered 2019-09-22 (×2): 250 mg via ORAL
  Filled 2019-09-22 (×2): qty 1

## 2019-09-22 MED ORDER — MAGNESIUM SULFATE 2 GM/50ML IV SOLN
2.0000 g | Freq: Once | INTRAVENOUS | Status: AC
  Administered 2019-09-22: 2 g via INTRAVENOUS
  Filled 2019-09-22: qty 50

## 2019-09-22 NOTE — Progress Notes (Signed)
Attempted to have patient stand to get back in bed from PT working with patient. Patient unable to stand and took 4 people to get patient back to bed. Please only use high fowlers to get patient OOB or use a lift with sling left underneath patient. Bartholomew Crews, RN 09/22/2019 3:28 PM

## 2019-09-22 NOTE — Progress Notes (Signed)
Initial Nutrition Assessment  RD working remotely.  DOCUMENTATION CODES:   Obesity unspecified  INTERVENTION:   - Magic cup TID with meals, each supplement provides 290 kcal and 9 grams of protein  - Encourage adequate PO intake  NUTRITION DIAGNOSIS:   Increased nutrient needs related to other (fractures) as evidenced by estimated needs.  GOAL:   Patient will meet greater than or equal to 90% of their needs  MONITOR:   PO intake, Supplement acceptance, Labs, Weight trends  REASON FOR ASSESSMENT:   Low Braden    ASSESSMENT:   85 year old male who presented to the ED on 10/14 after a fall. PMH atrial fibrillation, HTN, colon cancer. Pt found to have left rib fractures 7-11 and a left renal laceration with possible active extravasation and large hematoma. Pt is s/p embolization of left kidney laceration by IR.   10/19 - MBS showing likely chronic aspiration, pt desires to continue eating  Per review of notes, pt is HOH. RD did not attempt to call pt at this time. Will attempt to obtain diet and weight history upon follow-up.  Pt aware of aspiration risk with eating and is accepting of risk. Plan is for d/c to SNF for rehab.  Weight history in chart is limited. Last weight available PTA is from 2013 (131.6 kg). Current weight is 112.3 kg, indicating pt has lost weight since that time.  Per RN edema assessment, pt with moderate pitting edema to BLE.  Meal Completion: 15-90% x last 8 recorded meals  Medications reviewed and include: B-complex with vitamin C, Colace, K phos 250 mg BID, Miralax  Labs reviewed: phosphorus 2.0  UOP: 1950 ml x 24 hours I/O's: +4.6 L since admit  NUTRITION - FOCUSED PHYSICAL EXAM:  Unable to complete at this time. RD working remotely.  Diet Order:   Diet Order            DIET DYS 2 Room service appropriate? Yes; Fluid consistency: Thin  Diet effective now              EDUCATION NEEDS:   No education needs have been identified  at this time  Skin:  Skin Assessment: Skin Integrity Issues: Skin Integrity Issues: Incisions: groin  Last BM:  09/20/19  Height:   Ht Readings from Last 1 Encounters:  09/16/19 6' (1.829 m)    Weight:   Wt Readings from Last 1 Encounters:  09/16/19 112.3 kg    Ideal Body Weight:  80.9 kg  BMI:  Body mass index is 33.58 kg/m.  Estimated Nutritional Needs:   Kcal:  1700-1900  Protein:  90-110 grams  Fluid:  1.7-1.9 L    Gaynell Face, MS, RD, LDN Inpatient Clinical Dietitian Pager: [phone] Weekend/After Hours: [phone]

## 2019-09-22 NOTE — Progress Notes (Signed)
Physical Therapy Treatment Patient Details Name: [name] [name] MRN: [id number] DOB: July 05, 1923 Today's Date: 09/22/2019    History of Present Illness Patient is a 85 y/o male who presents with left rib fxs 7-11 and left renal laceration s/p IR embolization 10/14 after a fall at home. PMH includes HTN, A-fib and colon ca.    PT Comments    Patient seen for mobility progression. Pt is much weaker this session vs previously noted session. Pt requires max A +2 for sit to stand transfers using Stedy standing frame. HR elevated into 160s with transfer but back down quickly once pt at rest. Other VSS on RA. Maxi move hoyer lift pad left in room (not under pt) for tranfers in/out bed with nursing staff.  Continue to progress as tolerated with anticipated d/c to SNF for further skilled PT services.     Follow Up Recommendations  SNF     Equipment Recommendations  None recommended by PT    Recommendations for Other Services       Precautions / Restrictions Precautions Precautions: Fall Precaution Comments: watch 02 and HR Restrictions Weight Bearing Restrictions: No    Mobility  Bed Mobility Overal bed mobility: Needs Assistance Bed Mobility: Rolling;Sidelying to Sit Rolling: Mod assist Sidelying to sit: Max assist;HOB elevated       General bed mobility comments: assist to bring bilat LE to EOB and to elevate trunk into sitting; cues for sequencing   Transfers Overall transfer level: Needs assistance   Transfers: Sit to/from Stand Sit to Stand: +2 physical assistance;From elevated surface;Max assist         General transfer comment: attempted to stand from EOB however unable to achieve standing; Stedy standing frame utilized for sit to stands X 2 and transfer OOB to chair  Ambulation/Gait                 Stairs             Wheelchair Mobility    Modified Rankin (Stroke Patients Only)       Balance Overall balance assessment: Needs  assistance Sitting-balance support: Feet supported;No upper extremity supported Sitting balance-Leahy Scale: Fair     Standing balance support: Bilateral upper extremity supported;During functional activity Standing balance-Leahy Scale: Zero                              Cognition Arousal/Alertness: Awake/alert Behavior During Therapy: WFL for tasks assessed/performed Overall Cognitive Status: Within Functional Limits for tasks assessed                                 General Comments: appears [facility]       Exercises      General Comments General comments (skin integrity, edema, etc.): HR up to 160s with mobility      Pertinent Vitals/Pain Pain Assessment: Faces Faces Pain Scale: Hurts a little bit Pain Location: left side and L UE Pain Descriptors / Indicators: Sore;Guarding Pain Intervention(s): Limited activity within patient's tolerance;Monitored during session;Repositioned    Home Living                      Prior Function            PT Goals (current goals can now be found in the care plan section) Progress towards PT goals: Progressing toward goals    Frequency  Min 2X/week      PT Plan Current plan remains appropriate    Co-evaluation              AM-PAC PT "6 Clicks" Mobility   Outcome Measure  Help needed turning from your back to your side while in a flat bed without using bedrails?: A Lot Help needed moving from lying on your back to sitting on the side of a flat bed without using bedrails?: A Lot Help needed moving to and from a bed to a chair (including a wheelchair)?: A Lot Help needed standing up from a chair using your arms (e.g., wheelchair or bedside chair)?: A Lot Help needed to walk in hospital room?: Total Help needed climbing 3-5 steps with a railing? : Total 6 Click Score: 10    End of Session Equipment Utilized During Treatment: Gait belt Activity Tolerance: Patient limited by  fatigue Patient left: with call bell/phone within reach;in chair Nurse Communication: Mobility status;Need for lift equipment PT Visit Diagnosis: Muscle weakness (generalized) (M62.81);Difficulty in walking, not elsewhere classified (R26.2);Unsteadiness on feet (R26.81);Repeated falls (R29.6)     Time: [id number] PT Time Calculation (min) (ACUTE ONLY): 35 min  Charges:  $Gait Training: 8-22 mins $Therapeutic Activity: 8-22 mins                     Earney Navy, PTA Acute Rehabilitation Services Pager: [phone] Office: [phone]     Darliss Cheney 09/22/2019, 3:40 PM

## 2019-09-22 NOTE — TOC Progression Note (Signed)
Transition of Care [facility]) - Progression Note    Patient Details  Name: [name] [name] MRN: [id number] Date of Birth: 1923-05-14  Transition of Care [facility]) CM/SW [place], [place] Phone Number: [phone] 09/22/2019, 1:42 PM  Clinical Narrative:     CSW received call back from [place] who requested bed list again, CSW provided again. He now reports he's going to call the patient's niece to discuss and call me back with SNF facility choice. CSW attempted to explain insurance auth process and that decision needs to be made as far as choice of facility in order to initiate insurance Josem Kaufmann, [name] expressed understanding.   Expected Discharge Plan: [place] Barriers to Discharge: Continued Medical Work up, Ship broker  Expected Discharge Plan and Services Expected Discharge Plan: [place] In-house Referral: Clinical Social Work   Post Acute Care Choice: [place] Living arrangements for the past 2 months: Single Family Home                                       Social Determinants of Health (SDOH) Interventions    Readmission Risk Interventions No flowsheet data found.

## 2019-09-22 NOTE — Progress Notes (Signed)
[facility] Progress Note     Subjective: CC: uncomfortable in bed Patient reports he feels like he keeps sliding around in bed, more comfortable after repositioning. Tolerating diet and having bowel function. Still aware of aspiration risk and accepting of risk. Patient not needing nasal cannula this AM and pain well controlled.   Objective: Vital signs in last 24 hours: Temp:  [97.9 F (36.6 C)-98.3 F (36.8 C)] 98.1 F (36.7 C) (10/21 0811) Pulse Rate:  [98-109] 100 (10/21 0600) Resp:  [22-35] 24 (10/21 0811) BP: (102-226)/(59-92) 122/92 (10/21 0811) SpO2:  [98 %-100 %] 100 % (10/21 0839) Last BM Date: 09/20/19  Intake/Output from previous day: 10/20 0701 - 10/21 0700 In: 600 [P.O.:300; IV Piggyback:300] Out: 1950 [Urine:1950] Intake/Output this shift: No intake/output data recorded.  PE: Gen: Alert, NAD, pleasant Card:RRR, pedal pulses 2+ BL, mild pedal edema bilaterally  Pulm: Normal effort,lungs CTAB peripherally, junky sounding cough, pulled 500-750on IS Abd: Soft, non-tender, non-distended,+BS Skin: warm and dry, no rashes  Neuro: A&Ox4, speech clear   Lab Results:  Recent Labs    09/21/19 0224 09/22/19 0226  WBC 9.2 9.2  HGB 10.4* 10.2*  HCT 32.7* 29.8*  PLT 220 234   BMET Recent Labs    09/21/19 0224 09/22/19 0226  NA 136 138  K 3.1* 3.8  CL 106 108  CO2 21* 22  GLUCOSE 113* 107*  BUN 38* 35*  CREATININE 1.96* 1.80*  CALCIUM 8.1* 8.1*   PT/INR No results for input(s): LABPROT, INR in the last 72 hours. CMP     Component Value Date/Time   NA 138 09/22/2019 0226   K 3.8 09/22/2019 0226   CL 108 09/22/2019 0226   CO2 22 09/22/2019 0226   GLUCOSE 107 (H) 09/22/2019 0226   BUN 35 (H) 09/22/2019 0226   CREATININE 1.80 (H) 09/22/2019 0226   CALCIUM 8.1 (L) 09/22/2019 0226   PROT 6.2 (L) 09/15/2019 0922   ALBUMIN 3.1 (L) 09/15/2019 0922   AST 31 09/15/2019 0922   ALT 15 09/15/2019 0922   ALKPHOS 51 09/15/2019 0922   BILITOT 1.2 09/15/2019 0922   GFRNONAA 32 (L) 09/22/2019 0226   GFRAA 37 (L) 09/22/2019 0226   Lipase  No results found for: LIPASE     Studies/Results: No results found.  Anti-infectives: Anti-infectives (From admission, onward)   None       Assessment/Plan Fall L rib fxs 7-11- pain control and pulm toilet, IS, follow up CXR has been stable L kidney lac with possible active extravasation and large hematoma -s/p IR embolization 10/14 - Crtrending down,urine much clearer  ABL anemia-hgb 10.2, stable Atrial fibrillation- on xarelto (last dose 10/13 in PM) - hold xarelto, resumed other cardiac meds Chronic aspiration - discussed with patient, maximize efforts to minimize aspiration but feed for comfort HTN HLD H/o colon cancer s/p partial colectomy ~2006 Ventral hernia 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

## 2019-09-23 ENCOUNTER — Inpatient Hospital Stay (HOSPITAL_COMMUNITY): Payer: Medicare HMO

## 2019-09-23 LAB — BASIC METABOLIC PANEL
Anion gap: 8 (ref 5–15)
BUN: 35 mg/dL — ABNORMAL HIGH (ref 8–23)
CO2: 24 mmol/L (ref 22–32)
Calcium: 8.3 mg/dL — ABNORMAL LOW (ref 8.9–10.3)
Chloride: 106 mmol/L (ref 98–111)
Creatinine, Ser: 1.64 mg/dL — ABNORMAL HIGH (ref 0.61–1.24)
GFR calc Af Amer: 41 mL/min — ABNORMAL LOW (ref >60)
GFR calc non Af Amer: 35 mL/min — ABNORMAL LOW (ref >60)
Glucose, Bld: 144 mg/dL — ABNORMAL HIGH (ref 70–99)
Potassium: 3.4 mmol/L — ABNORMAL LOW (ref 3.5–5.1)
Sodium: 138 mmol/L (ref 135–145)

## 2019-09-23 LAB — CBC
HCT: 35.2 % — ABNORMAL LOW (ref 39.0–52.0)
Hemoglobin: 11.5 g/dL — ABNORMAL LOW (ref 13.0–17.0)
MCH: 31.3 pg (ref 26.0–34.0)
MCHC: 32.7 g/dL (ref 30.0–36.0)
MCV: 95.9 fL (ref 80.0–100.0)
Platelets: 298 10*3/uL (ref 150–400)
RBC: 3.67 MIL/uL — ABNORMAL LOW (ref 4.22–5.81)
RDW: 14.6 % (ref 11.5–15.5)
WBC: 9.7 10*3/uL (ref 4.0–10.5)
nRBC: 0 % (ref 0.0–0.2)

## 2019-09-23 LAB — MAGNESIUM: Magnesium: 2 mg/dL (ref 1.7–2.4)

## 2019-09-23 LAB — PHOSPHORUS: Phosphorus: 2.6 mg/dL (ref 2.5–4.6)

## 2019-09-23 MED ORDER — GLYCOPYRROLATE 1 MG PO TABS
1.0000 mg | ORAL_TABLET | Freq: Two times a day (BID) | ORAL | Status: DC
  Administered 2019-09-23 – 2019-09-28 (×11): 1 mg via ORAL
  Filled 2019-09-23 (×14): qty 1

## 2019-09-23 MED ORDER — POTASSIUM CHLORIDE CRYS ER 20 MEQ PO TBCR
20.0000 meq | EXTENDED_RELEASE_TABLET | Freq: Two times a day (BID) | ORAL | Status: AC
  Administered 2019-09-23 (×2): 20 meq via ORAL
  Filled 2019-09-23 (×2): qty 1

## 2019-09-23 NOTE — Progress Notes (Signed)
[facility] Progress Note     Subjective: CC: tired Patient appears exhausted this AM, breathing harder but still able to answer some questions. Patient tells me that he is tired and he had a long night. He denies increased pain.   Objective: Vital signs in last 24 hours: Temp:  [97.3 F (36.3 C)-98.5 F (36.9 C)] 98.5 F (36.9 C) (10/22 0802) Pulse Rate:  [86-110] 93 (10/22 0802) Resp:  [21-32] 24 (10/22 0802) BP: (121-134)/(77-97) 127/97 (10/22 0802) SpO2:  [91 %-100 %] 95 % (10/22 0802) Last BM Date: 09/16/19  Intake/Output from previous day: 10/21 0701 - 10/22 0700 In: 50 [IV Piggyback:50] Out: 1950 [Urine:1950] Intake/Output this shift: No intake/output data recorded.  PE: Gen: Appears tired Card:RRR, pedal pulses 2+ BL, mild pedal edema bilaterally Pulm: Normal effort,rales bilaterally, nasal cannula present  Abd: Soft, non-tender, non-distended,+BS Skin: warm and dry, no rashes  Neuro: A&Ox4, speech clear   Lab Results:  Recent Labs    09/22/19 0226 09/23/19 0318  WBC 9.2 9.7  HGB 10.2* 11.5*  HCT 29.8* 35.2*  PLT 234 298   BMET Recent Labs    09/22/19 0226 09/23/19 0318  NA 138 138  K 3.8 3.4*  CL 108 106  CO2 22 24  GLUCOSE 107* 144*  BUN 35* 35*  CREATININE 1.80* 1.64*  CALCIUM 8.1* 8.3*   PT/INR No results for input(s): LABPROT, INR in the last 72 hours. CMP     Component Value Date/Time   NA 138 09/23/2019 0318   K 3.4 (L) 09/23/2019 0318   CL 106 09/23/2019 0318   CO2 24 09/23/2019 0318   GLUCOSE 144 (H) 09/23/2019 0318   BUN 35 (H) 09/23/2019 0318   CREATININE 1.64 (H) 09/23/2019 0318   CALCIUM 8.3 (L) 09/23/2019 0318   PROT 6.2 (L) 09/15/2019 0922   ALBUMIN 3.1 (L) 09/15/2019 0922   AST 31 09/15/2019 0922   ALT 15 09/15/2019 0922   ALKPHOS 51 09/15/2019 0922   BILITOT 1.2 09/15/2019 0922   GFRNONAA 35 (L) 09/23/2019 0318   GFRAA 41 (L) 09/23/2019 0318   Lipase  No results found for:  LIPASE     Studies/Results: No results found.  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 Repeat CXR  LOS: 8 days    Brigid Re , [facility] 09/23/2019, 8:19 AM Please see Amion for pager number during day hours 7:00am-4:30pm

## 2019-09-24 LAB — BASIC METABOLIC PANEL
Anion gap: 7 (ref 5–15)
BUN: 40 mg/dL — ABNORMAL HIGH (ref 8–23)
CO2: 23 mmol/L (ref 22–32)
Calcium: 8.2 mg/dL — ABNORMAL LOW (ref 8.9–10.3)
Chloride: 105 mmol/L (ref 98–111)
Creatinine, Ser: 1.46 mg/dL — ABNORMAL HIGH (ref 0.61–1.24)
GFR calc Af Amer: 47 mL/min — ABNORMAL LOW (ref >60)
GFR calc non Af Amer: 41 mL/min — ABNORMAL LOW (ref >60)
Glucose, Bld: 136 mg/dL — ABNORMAL HIGH (ref 70–99)
Potassium: 3.8 mmol/L (ref 3.5–5.1)
Sodium: 135 mmol/L (ref 135–145)

## 2019-09-24 LAB — CBC
HCT: 35.6 % — ABNORMAL LOW (ref 39.0–52.0)
Hemoglobin: 11.5 g/dL — ABNORMAL LOW (ref 13.0–17.0)
MCH: 31.4 pg (ref 26.0–34.0)
MCHC: 32.3 g/dL (ref 30.0–36.0)
MCV: 97.3 fL (ref 80.0–100.0)
Platelets: 345 10*3/uL (ref 150–400)
RBC: 3.66 MIL/uL — ABNORMAL LOW (ref 4.22–5.81)
RDW: 14.7 % (ref 11.5–15.5)
WBC: 10.3 10*3/uL (ref 4.0–10.5)
nRBC: 0 % (ref 0.0–0.2)

## 2019-09-24 LAB — MAGNESIUM: Magnesium: 1.9 mg/dL (ref 1.7–2.4)

## 2019-09-24 LAB — PHOSPHORUS: Phosphorus: 2.8 mg/dL (ref 2.5–4.6)

## 2019-09-24 MED ORDER — FUROSEMIDE 10 MG/ML IJ SOLN
20.0000 mg | Freq: Once | INTRAMUSCULAR | Status: AC
  Administered 2019-09-24: 11:00:00 20 mg via INTRAVENOUS
  Filled 2019-09-24: qty 2

## 2019-09-24 MED ORDER — METHOCARBAMOL 500 MG PO TABS: 500.0000 mg | ORAL_TABLET | Freq: Three times a day (TID) | ORAL | Status: DC | PRN

## 2019-09-24 MED ORDER — ENSURE ENLIVE PO LIQD
237.0000 mL | Freq: Two times a day (BID) | ORAL | Status: DC
  Administered 2019-09-24 – 2019-09-28 (×9): 237 mL via ORAL

## 2019-09-24 MED ORDER — POTASSIUM CHLORIDE CRYS ER 20 MEQ PO TBCR
20.0000 meq | EXTENDED_RELEASE_TABLET | Freq: Three times a day (TID) | ORAL | Status: AC
  Administered 2019-09-24 (×3): 20 meq via ORAL
  Filled 2019-09-24 (×3): qty 1

## 2019-09-24 MED ORDER — FUROSEMIDE 40 MG PO TABS
40.0000 mg | ORAL_TABLET | Freq: Every day | ORAL | Status: DC
  Administered 2019-09-25 – 2019-09-28 (×4): 40 mg via ORAL
  Filled 2019-09-24 (×4): qty 1

## 2019-09-24 MED ORDER — GLYCERIN (LAXATIVE) 2.1 G RE SUPP
1.0000 | Freq: Once | RECTAL | Status: AC
  Administered 2019-09-24: 1 via RECTAL
  Filled 2019-09-24: qty 1

## 2019-09-24 NOTE — Progress Notes (Signed)
  Speech Language Pathology Treatment: Dysphagia  Patient Details Name: Woodrow Drab MRN: [id number] DOB: 04-24-1923 Today's Date: 09/24/2019 Time: 4255-2589 SLP Time Calculation (min) (ACUTE ONLY): 12 min  Assessment / Plan / Recommendation Clinical Impression  Pt took a few sips of water but did not want any food, stating that he was too tired. He said that he has had small snacks throughout the day but no meals, also saying that he had "what he wanted" and no more. He does still have a subtle delayed cough after sips of water. Education was reiterated about aspiration and the potential for swallow exercises to maximize function but not eliminate risk. Pt politely declined any swallow therapy at this time. He does not seem interested in it post-discharge either, although this could be offered at SNF if he were to change his mind. Given that pt can verbalize his precautions and risks and that he is accepting of risk, no further acute needs identified. SLP to sign off for now. Please reorder if we can assist.    HPI HPI: Pt is a 85 yo male admitted s/p fall with L rib fxs 7-11 and L kidney lac s/p IR emobolization 10/14. Pt was advancing his diet but developed increased SOB. CXR 10/16 showed bibasilar opacities, likely atelectasis. SLP swallow evaluation was ordered. PMH includes: HTN, colon cancer, Afib, arthritis, and self-reported GER      SLP Plan  All goals met       Recommendations  Diet recommendations: Dysphagia 2 (fine chop);Thin liquid Liquids provided via: Cup Medication Administration: Crushed with puree Supervision: Patient able to self feed;Intermittent supervision to cue for compensatory strategies Compensations: Slow rate;Small sips/bites;Multiple dry swallows after each bite/sip Postural Changes and/or Swallow Maneuvers: Seated upright 90 degrees;Upright 30-60 min after meal                Oral Care Recommendations: Oral care QID Follow up Recommendations: Skilled  Nursing facility SLP Visit Diagnosis: Dysphagia, pharyngoesophageal phase (R13.14) Plan: All goals met       GO                Venita Sheffield Stevin Bielinski 09/24/2019, 4:19 PM  Pollyann Glen, M.A. [place] [facility] Environmental education officer [phone] Office [phone]

## 2019-09-24 NOTE — Plan of Care (Signed)
  Problem: Education: Goal: Knowledge of General Education information will improve Description: Including pain rating scale, medication(s)/side effects and non-pharmacologic comfort measures Outcome: Progressing   Problem: Clinical Measurements: Goal: Ability to maintain clinical measurements within normal limits will improve Outcome: Progressing Goal: Will remain free from infection Outcome: Progressing Goal: Respiratory complications will improve Outcome: Progressing   Problem: Pain Managment: Goal: General experience of comfort will improve Outcome: Progressing   Problem: Safety: Goal: Ability to remain free from injury will improve Outcome: Progressing

## 2019-09-24 NOTE — TOC Progression Note (Addendum)
Transition of Care [facility]) - Progression Note    Patient Details  Name: Nyziah Shibuya MRN: [id number] Date of Birth: [birth year]  Transition of Care [facility]) CM/SW [place],  Phone Number:[phone] 09/24/2019, 9:13 AM  Clinical Narrative:     Update: CSW informed by [name] they chose [facility] however they state that they have no beds now, however their sister facility [facility] does. [name] informed and agreeable to [facility].  [place] Ram[place] has started [employer] [employer] this morning.     CSW spoke with patient's decision maker [name] who reports him and family are not happy with bed offers that CSW provided them and states he would like CSW to follow up on [facility], [facility], [facility] and [facility] to see why they declined patient.   CSW did so and informed [name] that [facility] are out of network with patient's insurance of [employer] and that [facility] and [facility] have no beds. CSW informed [name] that bed offers at the facilities discussed still stand and he will need to choose off of this list. [name] reports he's going to talk to family again and then call CSW back.   CSW reiterated once again that insurance authorization needs to be initiated today and that [name] should call back as soon as possible this morning in order for this to occur. He expressed understanding.   Expected Discharge Plan: [place] Barriers to Discharge: Continued Medical Work up, Ship broker  Expected Discharge Plan and Services Expected Discharge Plan: [place] In-house Referral: Clinical Social Work   Post Acute Care Choice: [place] Living arrangements for the past 2 months: Single Family Home                                       Social Determinants of Health (SDOH) Interventions    Readmission Risk Interventions No flowsheet data found.

## 2019-09-24 NOTE — Care Management (Signed)
Per CSW, [place] recommending [place] retesting for Monday AM.    Reinaldo Raddle, RN, BSN  Trauma/Neuro ICU Case Manager [phone]

## 2019-09-24 NOTE — Progress Notes (Addendum)
[facility] Progress Note     Subjective: CC: feels weak Patient appears tired this AM and reports he feels weak today. Has not had a BM in a few days. Denies SOB or much pain. Less productive cough today.   Objective: Vital signs in last 24 hours: Temp:  [97.3 F (36.3 C)-98.6 F (37 C)] 97.8 F (36.6 C) (10/23 0359) Pulse Rate:  [93-118] 98 (10/23 0359) Resp:  [20-33] 20 (10/23 0359) BP: (119-129)/(77-97) 119/88 (10/23 0359) SpO2:  [95 %-100 %] 100 % (10/23 0359) Last BM Date: 09/16/19  Intake/Output from previous day: 10/22 0701 - 10/23 0700 In: 480 [P.O.:480] Out: 800 [Urine:800] Intake/Output this shift: No intake/output data recorded.  PE: Gen: Appears tired Card:atrial fibrillation with rate around 100 bpm, pedal pulses 2+ BL, mild pedal edema bilaterally Pulm: Normal effort, diminished bilaterally, nasal cannula present  Abd: Soft, non-tender, non-distended,+BS Skin: warm and dry, no rashes  Neuro: A&Ox4, speech clear   Lab Results:  Recent Labs    09/23/19 0318 09/24/19 0213  WBC 9.7 10.3  HGB 11.5* 11.5*  HCT 35.2* 35.6*  PLT 298 345   BMET Recent Labs    09/23/19 0318 09/24/19 0213  NA 138 135  K 3.4* 3.8  CL 106 105  CO2 24 23  GLUCOSE 144* 136*  BUN 35* 40*  CREATININE 1.64* 1.46*  CALCIUM 8.3* 8.2*   PT/INR No results for input(s): LABPROT, INR in the last 72 hours. CMP     Component Value Date/Time   NA 135 09/24/2019 0213   K 3.8 09/24/2019 0213   CL 105 09/24/2019 0213   CO2 23 09/24/2019 0213   GLUCOSE 136 (H) 09/24/2019 0213   BUN 40 (H) 09/24/2019 0213   CREATININE 1.46 (H) 09/24/2019 0213   CALCIUM 8.2 (L) 09/24/2019 0213   PROT 6.2 (L) 09/15/2019 0922   ALBUMIN 3.1 (L) 09/15/2019 0922   AST 31 09/15/2019 0922   ALT 15 09/15/2019 0922   ALKPHOS 51 09/15/2019 0922   BILITOT 1.2 09/15/2019 0922   GFRNONAA 41 (L) 09/24/2019 0213   GFRAA 47 (L) 09/24/2019 0213   Lipase  No results found for:  LIPASE     Studies/Results: Dg Chest Port 1 View  Result Date: 09/23/2019 CLINICAL DATA:  Multiple closed left rib fractures. EXAM: PORTABLE CHEST 1 VIEW COMPARISON:  09/17/2019 FINDINGS: Elevation of the left hemidiaphragm with left base atelectasis. No pneumothorax. Coarsened interstitial opacities throughout the lungs could reflect chronic interstitial lung disease. Cardiomegaly. Multiple left rib fractures are noted. IMPRESSION: Stable exam. Electronically Signed   By: Rolm Baptise M.D.   On: 09/23/2019 08:47    Anti-infectives: Anti-infectives (From admission, onward)   None       Assessment/Plan Fall L rib fxs 7-11- pain control and pulm toilet, IS, repeat CXR yesterday stable  L kidney lac with possible active extravasation and large hematoma -s/p IR embolization 10/14 - Crtrending down,urinemuch clearer ABL anemia-hgb 11.5, stable Atrial fibrillation- on xarelto (last dose 10/13 in PM) - hold xarelto, resumed other cardiac meds Chronic aspiration- discussed with patient, maximize efforts to minimize aspiration but feed for comfort HTN HLD H/o colon cancer s/p partial colectomy ~2006 Ventral hernia 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   LOS: 9 days    Brigid Re , [facility] 09/24/2019, 7:46 AM Please see Amion for  pager number during day hours 7:00am-4:30pm

## 2019-09-25 LAB — BASIC METABOLIC PANEL
Anion gap: 9 (ref 5–15)
BUN: 39 mg/dL — ABNORMAL HIGH (ref 8–23)
CO2: 26 mmol/L (ref 22–32)
Calcium: 8.3 mg/dL — ABNORMAL LOW (ref 8.9–10.3)
Chloride: 102 mmol/L (ref 98–111)
Creatinine, Ser: 1.55 mg/dL — ABNORMAL HIGH (ref 0.61–1.24)
GFR calc Af Amer: 44 mL/min — ABNORMAL LOW (ref >60)
GFR calc non Af Amer: 38 mL/min — ABNORMAL LOW (ref >60)
Glucose, Bld: 125 mg/dL — ABNORMAL HIGH (ref 70–99)
Potassium: 3.9 mmol/L (ref 3.5–5.1)
Sodium: 137 mmol/L (ref 135–145)

## 2019-09-25 LAB — MAGNESIUM: Magnesium: 2 mg/dL (ref 1.7–2.4)

## 2019-09-25 LAB — PHOSPHORUS: Phosphorus: 3 mg/dL (ref 2.5–4.6)

## 2019-09-25 NOTE — Progress Notes (Signed)
[facility] Progress Note     Subjective: CC: tired and weak Patient tired this AM and feels weak. Denies SOB. Not eating much but eating what he is hungry for. Had some bowel function after suppository yesterday. Alert and oriented x4.   Objective: Vital signs in last 24 hours: Temp:  [97.5 F (36.4 C)-99.2 F (37.3 C)] 97.5 F (36.4 C) (10/24 0341) Pulse Rate:  [80-100] 80 (10/24 0341) Resp:  [20-24] 20 (10/24 0341) BP: (111-132)/(71-84) 132/84 (10/24 0341) SpO2:  [98 %-100 %] 98 % (10/24 0341) Last BM Date: 09/16/19  Intake/Output from previous day: 10/23 0701 - 10/24 0700 In: 480 [P.O.:480] Out: 1250 [Urine:1250] Intake/Output this shift: Total I/O In: -  Out: 600 [Urine:600]  PE: Gen: Appears tired Card:atrial fibrillation with rate around 80 bpm, pedal pulses 2+ BL, mild pedal edema bilaterally Pulm: Normal effort, diminished bilaterally, nasal cannula present Abd: Soft, non-tender, non-distended,+BS Skin: warm and dry, no rashes  Neuro: A&Ox4, speech clear  Lab Results:  Recent Labs    09/23/19 0318 09/24/19 0213  WBC 9.7 10.3  HGB 11.5* 11.5*  HCT 35.2* 35.6*  PLT 298 345   BMET Recent Labs    09/24/19 0213 09/25/19 0217  NA 135 137  K 3.8 3.9  CL 105 102  CO2 23 26  GLUCOSE 136* 125*  BUN 40* 39*  CREATININE 1.46* 1.55*  CALCIUM 8.2* 8.3*   PT/INR No results for input(s): LABPROT, INR in the last 72 hours. CMP     Component Value Date/Time   NA 137 09/25/2019 0217   K 3.9 09/25/2019 0217   CL 102 09/25/2019 0217   CO2 26 09/25/2019 0217   GLUCOSE 125 (H) 09/25/2019 0217   BUN 39 (H) 09/25/2019 0217   CREATININE 1.55 (H) 09/25/2019 0217   CALCIUM 8.3 (L) 09/25/2019 0217   PROT 6.2 (L) 09/15/2019 0922   ALBUMIN 3.1 (L) 09/15/2019 0922   AST 31 09/15/2019 0922   ALT 15 09/15/2019 0922   ALKPHOS 51 09/15/2019 0922   BILITOT 1.2 09/15/2019 0922   GFRNONAA 38 (L) 09/25/2019 0217   GFRAA 44 (L) 09/25/2019 0217    Lipase  No results found for: LIPASE     Studies/Results: Dg Chest Port 1 View  Result Date: 09/23/2019 CLINICAL DATA:  Multiple closed left rib fractures. EXAM: PORTABLE CHEST 1 VIEW COMPARISON:  09/17/2019 FINDINGS: Elevation of the left hemidiaphragm with left base atelectasis. No pneumothorax. Coarsened interstitial opacities throughout the lungs could reflect chronic interstitial lung disease. Cardiomegaly. Multiple left rib fractures are noted. IMPRESSION: Stable exam. Electronically Signed   By: Rolm Baptise M.D.   On: 09/23/2019 08:47    Anti-infectives: Anti-infectives (From admission, onward)   None       Assessment/Plan Fall L rib fxs 7-11- pain control and pulm toilet, IS,repeat CXR yesterday stable L kidney lac with possible active extravasation and large hematoma -s/p IR embolization 10/14 - Crtrending down,urinemuch clearer ABL anemia- stable Atrial fibrillation- on xarelto (last dose 10/13 in PM) - hold xarelto, resumed other cardiac meds - would not recommend restarting xarelto given age and high fall potential Chronic aspiration- discussed with patient, maximize efforts to minimize aspiration but feed for comfort HTN HLD H/o colon cancer s/p partial colectomy ~2006 Ventral hernia 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 Needs repeat COVID  LOS: 10 days    Brigid Re ,  [facility] 09/25/2019, 6:56 AM Please see Amion for pager number during day hours 7:00am-4:30pm

## 2019-09-25 NOTE — Plan of Care (Signed)

## 2019-09-26 LAB — CBC
HCT: 32.7 % — ABNORMAL LOW (ref 39.0–52.0)
Hemoglobin: 10.8 g/dL — ABNORMAL LOW (ref 13.0–17.0)
MCH: 31.7 pg (ref 26.0–34.0)
MCHC: 33 g/dL (ref 30.0–36.0)
MCV: 95.9 fL (ref 80.0–100.0)
Platelets: 340 10*3/uL (ref 150–400)
RBC: 3.41 MIL/uL — ABNORMAL LOW (ref 4.22–5.81)
RDW: 14.8 % (ref 11.5–15.5)
WBC: 9.8 10*3/uL (ref 4.0–10.5)
nRBC: 0 % (ref 0.0–0.2)

## 2019-09-26 LAB — BASIC METABOLIC PANEL
Anion gap: 11 (ref 5–15)
BUN: 43 mg/dL — ABNORMAL HIGH (ref 8–23)
CO2: 25 mmol/L (ref 22–32)
Calcium: 8.1 mg/dL — ABNORMAL LOW (ref 8.9–10.3)
Chloride: 100 mmol/L (ref 98–111)
Creatinine, Ser: 1.39 mg/dL — ABNORMAL HIGH (ref 0.61–1.24)
GFR calc Af Amer: 50 mL/min — ABNORMAL LOW (ref >60)
GFR calc non Af Amer: 43 mL/min — ABNORMAL LOW (ref >60)
Glucose, Bld: 126 mg/dL — ABNORMAL HIGH (ref 70–99)
Potassium: 3.3 mmol/L — ABNORMAL LOW (ref 3.5–5.1)
Sodium: 136 mmol/L (ref 135–145)

## 2019-09-26 MED ORDER — POTASSIUM CHLORIDE 20 MEQ/15ML (10%) PO SOLN
40.0000 meq | Freq: Once | ORAL | Status: AC
  Administered 2019-09-26: 40 meq via ORAL
  Filled 2019-09-26: qty 30

## 2019-09-26 NOTE — Progress Notes (Signed)
[facility] Progress Note     Subjective: CC: tired Patient is tired this AM. Feels slightly SOB, but still using flutter and IS. Discussed code status with patient who expressed wish to be made DNR. He reports overall functional decline in the last 2 years.   Objective: Vital signs in last 24 hours: Temp:  [97.3 F (36.3 C)-98.7 F (37.1 C)] 97.4 F (36.3 C) (10/25 0718) Pulse Rate:  [81-109] 100 (10/25 0718) Resp:  [13-25] 20 (10/25 0718) BP: (93-116)/(61-75) 116/70 (10/25 0718) SpO2:  [97 %-100 %] 97 % (10/25 0718) Weight:  [114.7 kg] 114.7 kg (10/25 0400) Last BM Date: 09/26/19  Intake/Output from previous day: 10/24 0701 - 10/25 0700 In: 600 [P.O.:600] Out: 2252 [Urine:2250; Stool:2] Intake/Output this shift: No intake/output data recorded.  PE: Gen: Appears tired Card:atrial fibrillation with rate around 80 bpm, pedal pulses 2+ BL, mild pedal edema bilaterally Pulm: Normal effort,diminished bilaterally, nasal cannula present Abd: Soft, non-tender, non-distended,+BS Skin: warm and dry, no rashes  Neuro: A&Ox4, speech clear  Lab Results:  Recent Labs    09/24/19 0213 09/26/19 0226  WBC 10.3 9.8  HGB 11.5* 10.8*  HCT 35.6* 32.7*  PLT 345 340   BMET Recent Labs    09/25/19 0217 09/26/19 0226  NA 137 136  K 3.9 3.3*  CL 102 100  CO2 26 25  GLUCOSE 125* 126*  BUN 39* 43*  CREATININE 1.55* 1.39*  CALCIUM 8.3* 8.1*   PT/INR No results for input(s): LABPROT, INR in the last 72 hours. CMP     Component Value Date/Time   NA 136 09/26/2019 0226   K 3.3 (L) 09/26/2019 0226   CL 100 09/26/2019 0226   CO2 25 09/26/2019 0226   GLUCOSE 126 (H) 09/26/2019 0226   BUN 43 (H) 09/26/2019 0226   CREATININE 1.39 (H) 09/26/2019 0226   CALCIUM 8.1 (L) 09/26/2019 0226   PROT 6.2 (L) 09/15/2019 0922   ALBUMIN 3.1 (L) 09/15/2019 0922   AST 31 09/15/2019 0922   ALT 15 09/15/2019 0922   ALKPHOS 51 09/15/2019 0922   BILITOT 1.2 09/15/2019  0922   GFRNONAA 43 (L) 09/26/2019 0226   GFRAA 50 (L) 09/26/2019 0226   Lipase  No results found for: LIPASE     Studies/Results: No results found.  Anti-infectives: Anti-infectives (From admission, onward)   None       Assessment/Plan Fall L rib fxs 7-11- pain control and pulm toilet, IS,repeat CXRyesterday stable L kidney lac with possible active extravasation and large hematoma -s/p IR embolization 10/14 - Crtrending down,urinemuch clearer ABL anemia- stable Atrial fibrillation- on xarelto (last dose 10/13 in PM) - hold xarelto, resumed other cardiac meds - would not recommend restarting xarelto given age and high fall potential Chronic aspiration- discussed with patient, maximize efforts to minimize aspiration but feed for comfort HTN HLD H/o colon cancer s/p partial colectomy ~2006 Ventral hernia 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 Needs repeat COVID  LOS: 11 days    Brigid Re , [facility] 09/26/2019, 8:40 AM Please see Amion for pager number during day hours 7:00am-4:30pm

## 2019-09-26 NOTE — Plan of Care (Signed)

## 2019-09-26 NOTE — Progress Notes (Signed)
Pt refused to get up to sit in a chair, he said he still feels week. Tried to wean him off to room air but he seems SOB even oxygen was 96% in RA so oxygen started back on 2 L just for the comfort. Turning and positioning continue throughout the day, Friend is visiting in a bed side and is updated Will continue to monitor the patient  Palma Holter, RN

## 2019-09-27 LAB — BASIC METABOLIC PANEL
Anion gap: 10 (ref 5–15)
BUN: 40 mg/dL — ABNORMAL HIGH (ref 8–23)
CO2: 26 mmol/L (ref 22–32)
Calcium: 8.2 mg/dL — ABNORMAL LOW (ref 8.9–10.3)
Chloride: 100 mmol/L (ref 98–111)
Creatinine, Ser: 1.29 mg/dL — ABNORMAL HIGH (ref 0.61–1.24)
GFR calc Af Amer: 55 mL/min — ABNORMAL LOW (ref >60)
GFR calc non Af Amer: 47 mL/min — ABNORMAL LOW (ref >60)
Glucose, Bld: 114 mg/dL — ABNORMAL HIGH (ref 70–99)
Potassium: 3.5 mmol/L (ref 3.5–5.1)
Sodium: 136 mmol/L (ref 135–145)

## 2019-09-27 LAB — SARS CORONAVIRUS 2 (TAT 6-24 HRS): SARS Coronavirus 2: NEGATIVE

## 2019-09-27 NOTE — Progress Notes (Signed)
[facility] Progress Note     Subjective: CC: weak Patient reports he feels weak. He looks a little perkier today than over the weekend. Ate about 60% of his breakfast. He was asking questions about SNF. Reports [name] visited with him yesterday. Remembers conversation about DNR status yesterday and confirms that his wishes are still to be a DNR.   Objective: Vital signs in last 24 hours: Temp:  [97.4 F (36.3 C)-98.3 F (36.8 C)] 97.4 F (36.3 C) (10/26 0300) Pulse Rate:  [70-120] 86 (10/26 0300) Resp:  [20-27] 25 (10/26 0300) BP: (91-134)/(59-123) 120/73 (10/26 0300) SpO2:  [96 %-100 %] 98 % (10/26 0753) Last BM Date: 09/26/19  Intake/Output from previous day: 10/25 0701 - 10/26 0700 In: 460 [P.O.:460] Out: 750 [Urine:750] Intake/Output this shift: No intake/output data recorded.  PE: Gen: alert, NAD Card:atrial fibrillation with rate around80bpm, pedal pulses 2+ BL, mild pedal edema bilaterally Pulm: Normal effort,diminished bilaterally, nasal cannula present Abd: Soft, non-tender, non-distended,+BS Skin: warm and dry, no rashes  Neuro: A&Ox4, speech clear  Lab Results:  Recent Labs    09/26/19 0226  WBC 9.8  HGB 10.8*  HCT 32.7*  PLT 340   BMET Recent Labs    09/26/19 0226 09/27/19 0216  NA 136 136  K 3.3* 3.5  CL 100 100  CO2 25 26  GLUCOSE 126* 114*  BUN 43* 40*  CREATININE 1.39* 1.29*  CALCIUM 8.1* 8.2*   PT/INR No results for input(s): LABPROT, INR in the last 72 hours. CMP     Component Value Date/Time   NA 136 09/27/2019 0216   K 3.5 09/27/2019 0216   CL 100 09/27/2019 0216   CO2 26 09/27/2019 0216   GLUCOSE 114 (H) 09/27/2019 0216   BUN 40 (H) 09/27/2019 0216   CREATININE 1.29 (H) 09/27/2019 0216   CALCIUM 8.2 (L) 09/27/2019 0216   PROT 6.2 (L) 09/15/2019 0922   ALBUMIN 3.1 (L) 09/15/2019 0922   AST 31 09/15/2019 0922   ALT 15 09/15/2019 0922   ALKPHOS 51 09/15/2019 0922   BILITOT 1.2 09/15/2019 0922   GFRNONAA 47 (L) 09/27/2019 0216   GFRAA 55 (L) 09/27/2019 0216   Lipase  No results found for: LIPASE     Studies/Results: No results found.  Anti-infectives: Anti-infectives (From admission, onward)   None       Assessment/Plan Fall L rib fxs 7-11- pain control and pulm toilet, IS,repeat CXRyesterday stable L kidney lac with possible active extravasation and large hematoma -s/p IR embolization 10/14 - Crtrending down,urinemuch clearer ABL anemia- stable Atrial fibrillation- on xarelto (last dose 10/13 in PM) - hold xarelto, resumed other cardiac meds - would not recommend restarting xarelto given age and high fall potential Chronic aspiration- discussed with patient, maximize efforts to minimize aspiration but feed for comfort HTN HLD H/o colon cancer s/p partial colectomy ~2006 Ventral hernia 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 Needs repeat COVID test today. DNR form signed and on chart.   LOS: 12 days    Brigid Re , [facility] 09/27/2019, 8:18 AM Please see Amion for pager number during day hours 7:00am-4:30pm

## 2019-09-27 NOTE — Discharge Instructions (Signed)
Rib Fracture  A rib fracture is a break or crack in one of the bones of the ribs. The ribs are like a cage that goes around your upper chest. A broken or cracked rib is often painful, but most do not cause other problems. Most rib fractures usually heal on their own in 1-3 months. Follow these instructions at home: Managing pain, stiffness, and swelling  If directed, apply ice to the injured area. ? Put ice in a plastic bag. ? Place a towel between your skin and the bag. ? Leave the ice on for 20 minutes, 2-3 times a day.  Take over-the-counter and prescription medicines only as told by your doctor. Activity  Avoid activities that cause pain to the injured area. Protect your injured area.  Slowly increase activity as told by your doctor. General instructions  Do deep breathing as told by your doctor. You may be told to: ? Take deep breaths many times a day. ? Cough many times a day while hugging a pillow. ? Use a device (incentive spirometer) to do deep breathing many times a day.  Drink enough fluid to keep your pee (urine) clear or pale yellow.  Do not wear a rib belt or binder. These do not allow you to breathe deeply.  Keep all follow-up visits as told by your doctor. This is important. Contact a doctor if:  You have a fever. Get help right away if:  You have trouble breathing.  You are short of breath.  You cannot stop coughing.  You cough up thick or bloody spit (sputum).  You feel sick to your stomach (nauseous), throw up (vomit), or have belly (abdominal) pain.  Your pain gets worse and medicine does not help. Summary  A rib fracture is a break or crack in one of the bones of the ribs.  Apply ice to the injured area and take medicines for pain as told by your doctor.  Take deep breaths and cough many times a day. Hug a pillow every time you cough. This information is not intended to replace advice given to you by your health care provider. Make sure you  discuss any questions you have with your health care provider. Document Released: 08/27/2008 Document Revised: 10/31/2017 Document Reviewed: 02/18/2017 Elsevier Patient Education  [place].   Embolization, Care After This sheet gives you information about how to care for yourself after your procedure. Your health care provider may also give you more specific instructions. If you have problems or questions, contact your health care provider. What can I expect after the procedure? After the procedure, it is common to have pain or bruising at the area where your catheter was inserted (insertion site). You may have other symptoms depending on the part of your body that was treated. For example:  Embolization of the arteries in the brain may cause a headache.  Embolization of the arteries in the stomach may cause loss of appetite or nausea. Follow these instructions at home: Insertion site care   If the site starts to bleed, lie down flat and put pressure on the site. If the bleeding does not stop, get help right away. This is a medical emergency.  Follow instructions from your health care provider about how to take care of your insertion site. Make sure you: ? Wash your hands with soap and water before and after you change your bandage (dressing). If soap and water are not available, use hand sanitizer. ? Change your dressing as told  by your health care provider. ? Leave stitches (sutures), skin glue, or adhesive strips in place. These skin closures may need to stay in place for 2 weeks or longer. If adhesive strip edges start to loosen and curl up, you may trim the loose edges. Do not remove adhesive strips completely unless your health care provider tells you to do that.  Keep the insertion site clean. To do this: ? Gently wash the site with plain soap and water. ? Pat the area dry with a clean towel. ? Do not rub the site. This may cause bleeding.  Check your insertion site  every day for signs of infection. Check for: ? Redness, swelling, or pain. ? Fluid or blood. ? Warmth. ? Pus or a bad smell.  Do not take baths, swim, or use a hot tub until your health care provider approves. You may be allowed to shower 24-48 hours after the procedure. Activity   Do not lift anything that is heavier than 10 lb (4.5 kg), or the limit that you are told, until your health care provider says that it is safe.  Do not drive for 24 hours if you were given a sedative during your procedure.  Return to your normal activities as told by your health care provider. Ask your health care provider what activities are safe for you. General instructions   Take over-the-counter and prescription medicines only as told by your health care provider.  Ask your health care provider if the medicine prescribed to you: ? Requires you to avoid driving or using heavy machinery. ? Can cause constipation. You may need to take actions to prevent or treat constipation, such as:  Drink enough fluid to keep your urine pale yellow.  Take over-the-counter or prescription medicines.  Eat foods that are high in fiber, such as beans, whole grains, and fresh fruits and vegetables.  Limit foods that are high in fat and processed sugars, such as fried or sweet foods.  Keep all follow-up visits as told by your health care provider. This is important. Contact a health care provider if:  You have pain that gets worse or does not get better with medicine.  You have a fever.  You have redness, swelling, or pain around your insertion site.  You have fluid or blood coming from your insertion site.  Your insertion site is warm to the touch.  You have pus or a bad smell coming from your insertion site.  You have nausea or vomiting. Get help right away if:  The insertion area swells very quickly.  The insertion area is bleeding, and the bleeding does not stop after you hold steady pressure on the  area.  The area near or just beyond the insertion site becomes pale, cool, tingly, or numb.  You faint or feel like you might faint.  You have chest pain.  You have trouble breathing.  You feel weak or have trouble moving your arms or legs.  You have problems with balance, speech, or vision. These symptoms may represent a serious problem that is an emergency. Do not wait to see if the symptoms will go away. Get medical help right away. Call your local emergency services (911 in the [place] Do not drive yourself to the hospital. Summary  After your procedure, it is common to have pain or bruising at the area where your catheter was inserted (insertion site).  Do not drive for 24 hours if you were given a sedative during your procedure.  Follow instructions from your health care provider about how to take care of your insertion site.  Contact a health care provider if you have a fever or if you have redness, swelling, or pain around your insertion site.  Keep all follow-up visits as told by your health care provider. This is important. This information is not intended to replace advice given to you by your health care provider. Make sure you discuss any questions you have with your health care provider. Document Released: 11/23/2013 Document Revised: 07/20/2018 Document Reviewed: 07/20/2018 Elsevier Patient Education  2020 [employer].

## 2019-09-27 NOTE — TOC Progression Note (Signed)
Transition of Care [facility]) - Progression Note    Patient Details  Name: [name] [name] MRN: [id number] Date of Birth: 1923-11-22  Transition of Care [facility]) CM/SW [place], [place] Phone Number: 09/27/2019, 11:14 AM  Clinical Narrative:    CSW f/u with Ebony Hail at [facility] in regards to [facility] authorization. Also acknowledging that new COVID ordered.    Expected Discharge Plan: [place] Barriers to Discharge: Continued Medical Work up, Ship broker  Expected Discharge Plan and Services Expected Discharge Plan: [place] In-house Referral: Clinical Social Work   Post Acute Care Choice: [place] Living arrangements for the past 2 months: Single Family Home   Social Determinants of Health (SDOH) Interventions    Readmission Risk Interventions No flowsheet data found.

## 2019-09-27 NOTE — Progress Notes (Signed)
Back to bed with hoyer lift tolerated well.

## 2019-09-27 NOTE — Progress Notes (Signed)
Occupational Therapy Treatment Patient Details Name: [name] [name] MRN: [id number] DOB: 05-25-1923 Today's Date: 09/27/2019    History of present illness Patient is a 85 y/o male who presents with left rib fxs 7-11 and left renal laceration s/p IR embolization 10/14 after a fall at home. PMH includes HTN, A-fib and colon ca.   OT comments  Pt seen in conjunction with PT.  He required mod A +2 to transfer to chair using the stedy.  He demonstrates difficulty achieving full standing.  He requires min A - total A for ADLs.  Continue to recommend SNF.   Follow Up Recommendations  SNF;Supervision/Assistance - 24 hour    Equipment Recommendations  None recommended by OT    Recommendations for Other Services      Precautions / Restrictions Precautions Precautions: Fall Precaution Comments: watch 02 and HR Restrictions Weight Bearing Restrictions: No       Mobility Bed Mobility Overal bed mobility: Needs Assistance Bed Mobility: Rolling;Sidelying to Sit Rolling: Mod assist Sidelying to sit: HOB elevated;Mod assist       General bed mobility comments: cues for sequencing and use of rail to roll toward L side; assist to bring bilat LE to EOB and to elevate trunk into sitting  Transfers Overall transfer level: Needs assistance   Transfers: Sit to/from Stand Sit to Stand: +2 physical assistance;From elevated surface;Mod assist         General transfer comment: multimodal cues for upright posture/keeping head up and for hip extension; assist required to power up into standing and to achieve upright posture    Balance Overall balance assessment: Needs assistance Sitting-balance support: Feet supported;No upper extremity supported Sitting balance-Leahy Scale: Fair     Standing balance support: Bilateral upper extremity supported;During functional activity Standing balance-Leahy Scale: Zero Standing balance comment: Pt unable to achieve erect standing                            ADL either performed or assessed with clinical judgement   ADL Overall ADL's : Needs assistance/impaired Eating/Feeding: Set up;Sitting   Grooming: Wash/dry face;Wash/dry hands;Oral care;Brushing hair;Set up;Supervision/safety;Sitting       Lower Body Bathing: Maximal assistance;Sit to/from stand       Lower Body Dressing: Total assistance;Sit to/from stand               Functional mobility during ADLs: Moderate assistance;+2 for physical assistance;+2 for safety/equipment(STEDY )       Vision       Perception     Praxis      Cognition Arousal/Alertness: Awake/alert Behavior During Therapy: WFL for tasks assessed/performed Overall Cognitive Status: Within Functional Limits for tasks assessed                                 General Comments: WFL for tasks assessed         Exercises     Shoulder Instructions       General Comments HR up to 142 bpm with mobility and SpO2 95-98% on RA ; pt with labored breathing throughout    Pertinent Vitals/ Pain       Pain Assessment: Faces Faces Pain Scale: Hurts little more Pain Location: L hip/ribs and knees with weight bearing Pain Descriptors / Indicators: Guarding;Grimacing;Sore Pain Intervention(s): Monitored during session;Limited activity within patient's tolerance;Patient requesting pain meds-RN notified  Home Living  Prior Functioning/Environment              Frequency  Min 2X/week        Progress Toward Goals  OT Goals(current goals can now be found in the care plan section)  Progress towards OT goals: Progressing toward goals     Plan Discharge plan remains appropriate    Co-evaluation    PT/OT/SLP Co-Evaluation/Treatment: Yes Reason for Co-Treatment: Complexity of the patient's impairments (multi-system involvement);Necessary to address cognition/behavior during functional activity;For  patient/therapist safety;To address functional/ADL transfers   OT goals addressed during session: ADL's and self-care;Strengthening/ROM      AM-PAC OT "6 Clicks" Daily Activity     Outcome Measure   Help from another person eating meals?: A Little Help from another person taking care of personal grooming?: A Little Help from another person toileting, which includes using toliet, bedpan, or urinal?: A Lot Help from another person bathing (including washing, rinsing, drying)?: A Lot Help from another person to put on and taking off regular upper body clothing?: A Lot Help from another person to put on and taking off regular lower body clothing?: Total 6 Click Score: 13    End of Session Equipment Utilized During Treatment: Other (comment)(stedy )  OT Visit Diagnosis: Other abnormalities of gait and mobility (R26.89);Muscle weakness (generalized) (M62.81);Pain;History of falling (Z91.81) Pain - Right/Left: Left Pain - part of body: (ribs )   Activity Tolerance Patient limited by fatigue   Patient Left in chair;with call bell/phone within reach   Nurse Communication Mobility status;Need for lift equipment        Time: [id number] OT Time Calculation (min): 32 min  Charges: OT General Charges $OT Visit: 1 Visit OT Treatments $Therapeutic Activity: 8-22 mins  Lucille Passy, OTR/L [place] Pager [phone] Office [phone]    Lucille Passy M 09/27/2019, 4:47 PM

## 2019-09-27 NOTE — Progress Notes (Signed)
Physical Therapy Treatment Patient Details Name: [name] [name] MRN: [id number] DOB: 1923/04/22 Today's Date: 09/27/2019    History of Present Illness Patient is a 85 y/o male who presents with left rib fxs 7-11 and left renal laceration s/p IR embolization 10/14 after a fall at home. PMH includes HTN, A-fib and colon ca.    PT Comments    Patient seen for mobility progression. Pt requires mod A for bed mobility and mod A +2 for functional transfer training. Stedy standing frame utilized for OOB transfer to recliner. Pt with improved tolerance to activity this session. Patient with A fib and HR elevated to 142 bpm. SpO2 WNL on RA. Pt with labored breathing throughout.  Continue to progress as tolerated with anticipated d/c to SNF for further skilled PT services.     Follow Up Recommendations  SNF     Equipment Recommendations  None recommended by PT    Recommendations for Other Services       Precautions / Restrictions Precautions Precautions: Fall Precaution Comments: watch 02 and HR Restrictions Weight Bearing Restrictions: No    Mobility  Bed Mobility Overal bed mobility: Needs Assistance Bed Mobility: Rolling;Sidelying to Sit Rolling: Mod assist Sidelying to sit: HOB elevated;Mod assist       General bed mobility comments: cues for sequencing and use of rail to roll toward L side; assist to bring bilat LE to EOB and to elevate trunk into sitting  Transfers Overall transfer level: Needs assistance   Transfers: Sit to/from Stand Sit to Stand: +2 physical assistance;From elevated surface;Mod assist         General transfer comment: multimodal cues for upright posture/keeping head up and for hip extension; assist required to power up into standing and to achieve upright posture  Ambulation/Gait                 Stairs             Wheelchair Mobility    Modified Rankin (Stroke Patients Only)       Balance Overall balance assessment: Needs  assistance Sitting-balance support: Feet supported;No upper extremity supported Sitting balance-Leahy Scale: Fair     Standing balance support: Bilateral upper extremity supported;During functional activity Standing balance-Leahy Scale: Zero                              Cognition Arousal/Alertness: Awake/alert Behavior During Therapy: WFL for tasks assessed/performed Overall Cognitive Status: Within Functional Limits for tasks assessed                                        Exercises      General Comments General comments (skin integrity, edema, etc.): HR up to 142 bpm with mobility and SpO2 WNL; pt with labored breathing throughout      Pertinent Vitals/Pain Pain Assessment: Faces Faces Pain Scale: Hurts little more Pain Location: L hip/ribs and knees with weight bearing Pain Descriptors / Indicators: Guarding;Grimacing;Sore Pain Intervention(s): Limited activity within patient's tolerance;Monitored during session;Repositioned    Home Living                      Prior Function            PT Goals (current goals can now be found in the care plan section) Progress towards PT goals: Progressing toward goals  Frequency    Min 2X/week      PT Plan Current plan remains appropriate    Co-evaluation PT/OT/SLP Co-Evaluation/Treatment: Yes Reason for Co-Treatment: Complexity of the patient's impairments (multi-system involvement);For patient/therapist safety;To address functional/ADL transfers PT goals addressed during session: Mobility/safety with mobility        AM-PAC PT "6 Clicks" Mobility   Outcome Measure  Help needed turning from your back to your side while in a flat bed without using bedrails?: A Lot Help needed moving from lying on your back to sitting on the side of a flat bed without using bedrails?: A Lot Help needed moving to and from a bed to a chair (including a wheelchair)?: A Lot Help needed standing up  from a chair using your arms (e.g., wheelchair or bedside chair)?: A Lot Help needed to walk in hospital room?: Total Help needed climbing 3-5 steps with a railing? : Total 6 Click Score: 10    End of Session Equipment Utilized During Treatment: Gait belt Activity Tolerance: Patient limited by fatigue Patient left: with call bell/phone within reach;in chair;with chair alarm set Nurse Communication: Mobility status;Need for lift equipment PT Visit Diagnosis: Muscle weakness (generalized) (M62.81);Difficulty in walking, not elsewhere classified (R26.2);Unsteadiness on feet (R26.81);Repeated falls (R29.6)     Time: [id number] PT Time Calculation (min) (ACUTE ONLY): 31 min  Charges:  $Gait Training: 8-22 mins                     Earney Navy, PTA Acute Rehabilitation Services Pager: [phone] Office: [phone]     Darliss Cheney 09/27/2019, 12:42 PM

## 2019-09-27 NOTE — Care Management Important Message (Signed)
Important Message  Patient Details  Name: [name] [name] MRN: [id number] Date of Birth: 1923-05-12   Medicare Important Message Given:  Yes     Shelda Altes 09/27/2019, 3:40 PM

## 2019-09-27 NOTE — Plan of Care (Signed)

## 2019-09-28 MED ORDER — POLYETHYLENE GLYCOL 3350 17 G PO PACK: 17.0000 g | PACK | Freq: Every day | ORAL | 0 refills | Status: AC

## 2019-09-28 MED ORDER — ENSURE ENLIVE PO LIQD: 237.0000 mL | Freq: Two times a day (BID) | ORAL | 12 refills | Status: AC

## 2019-09-28 MED ORDER — POTASSIUM CHLORIDE 20 MEQ/15ML (10%) PO SOLN
40.0000 meq | Freq: Once | ORAL | Status: AC
  Administered 2019-09-28: 40 meq via ORAL
  Filled 2019-09-28: qty 30

## 2019-09-28 MED ORDER — METHOCARBAMOL 500 MG PO TABS: 500.0000 mg | ORAL_TABLET | Freq: Three times a day (TID) | ORAL | Status: AC | PRN

## 2019-09-28 MED ORDER — TRAMADOL HCL 50 MG PO TABS: 50.0000 mg | ORAL_TABLET | Freq: Four times a day (QID) | ORAL | 0 refills | Status: DC | PRN

## 2019-09-28 MED ORDER — ACETAMINOPHEN 325 MG PO TABS: 650.0000 mg | ORAL_TABLET | Freq: Four times a day (QID) | ORAL | Status: AC

## 2019-09-28 MED ORDER — GUAIFENESIN ER 600 MG PO TB12: 600.0000 mg | ORAL_TABLET | Freq: Two times a day (BID) | ORAL | Status: AC | PRN

## 2019-09-28 MED ORDER — GLYCOPYRROLATE 1 MG PO TABS: 1.0000 mg | ORAL_TABLET | Freq: Two times a day (BID) | ORAL | Status: AC

## 2019-09-28 MED ORDER — DOCUSATE SODIUM 100 MG PO CAPS: 100.0000 mg | ORAL_CAPSULE | Freq: Two times a day (BID) | ORAL | 0 refills | Status: AC

## 2019-09-28 NOTE — Progress Notes (Signed)
Nutrition Follow-up  RD working remotely.  DOCUMENTATION CODES:   Obesity unspecified  INTERVENTION:   - Continue Magic cup TID with meals, each supplement provides 290 kcal and 9 grams of protein  - Continue Ensure Enlive po BID, each supplement provides 350 kcal and 20 grams of protein  - Encourage adequate PO intake  NUTRITION DIAGNOSIS:   Increased nutrient needs related to other (fractures) as evidenced by estimated needs.  Ongoing, being addressed via oral nutrition supplements  GOAL:   Patient will meet greater than or equal to 90% of their needs  Progressing  MONITOR:   PO intake, Supplement acceptance, Labs, Weight trends  REASON FOR ASSESSMENT:   Low [place]    ASSESSMENT:   85 year old male who presented to the ED on 10/14 after a fall. PMH atrial fibrillation, HTN, colon cancer. Pt found to have left rib fractures 7-11 and a left renal laceration with possible active extravasation and large hematoma. Pt is s/p embolization of left kidney laceration by IR.  10/19 - MBS showing likely chronic aspiration, pt desires to continue eating  Pt is medically stable for d/c to SNF, awaiting insurance approval.  Pt accepting most Ensure Enlive supplements per MAR.  Per notes, pt is not eating much but is eating what he is hungry for. Feeding tube not within [place].  Weight up 5 lbs since 09/16/19. Per RN edema assessment, pt with mild pitting generalized edema and mild pitting edema to BLE.  Meal Completion: 25-60% x last 8 recorded meals  Medications reviewed and include: B-complex with vitamin C, Colace, Ensure Enlive BID, Lasix, Miralax  Labs reviewed: BUN 40, creatinine 1.29  UOP: 1400 ml x 24 hours  NUTRITION - FOCUSED PHYSICAL EXAM:  Unable to complete at this time. RD working remotely.  Diet Order:   Diet Order            DIET DYS 2 Room service appropriate? Yes; Fluid consistency: Thin  Diet effective now              EDUCATION NEEDS:    No education needs have been identified at this time  Skin:  Skin Assessment: Skin Integrity Issues: Skin Integrity Issues: Incisions: groin  Last BM:  09/26/19  Height:   Ht Readings from Last 1 Encounters:  09/16/19 6' (1.829 m)    Weight:   Wt Readings from Last 1 Encounters:  09/26/19 114.7 kg    Ideal Body Weight:  80.9 kg  BMI:  Body mass index is 34.29 kg/m.  Estimated Nutritional Needs:   Kcal:  1700-1900  Protein:  90-110 grams  Fluid:  1.7-1.9 L    Gaynell Face, MS, RD, LDN Inpatient Clinical Dietitian Pager: [phone] Weekend/After Hours: [phone]

## 2019-09-28 NOTE — Progress Notes (Signed)
Pt discharged transported by [facility] to [facility]. BP 133/72 Temp 98.1. Belongings sent with pt.

## 2019-09-28 NOTE — Progress Notes (Signed)
[facility] Progress Note     Subjective: CC: weak Patient feels week, stable. Eating breakfast this AM. Patient very appreciative of care and wanting to get to rehab.   Objective: Vital signs in last 24 hours: Temp:  [97.4 F (36.3 C)-98.4 F (36.9 C)] 97.4 F (36.3 C) (10/27 0249) Pulse Rate:  [85-102] 87 (10/27 0249) Resp:  [17-24] 17 (10/27 0249) BP: (91-115)/(58-63) 91/58 (10/27 0249) SpO2:  [100 %] 100 % (10/27 0249) Last BM Date: 09/26/19  Intake/Output from previous day: 10/26 0701 - 10/27 0700 In: 240 [P.O.:240] Out: 1400 [Urine:1400] Intake/Output this shift: No intake/output data recorded.  PE: Gen: alert, NAD Card:atrial fibrillation with rate around80bpm, pedal pulses 2+ BL, mild pedal edema bilaterally Pulm: Normal effort,diminished bilaterally, nasal cannula present Abd: Soft, non-tender, non-distended,+BS Skin: warm and dry, no rashes  Neuro: A&Ox4, speech clear  Lab Results:  Recent Labs    09/26/19 0226  WBC 9.8  HGB 10.8*  HCT 32.7*  PLT 340   BMET Recent Labs    09/26/19 0226 09/27/19 0216  NA 136 136  K 3.3* 3.5  CL 100 100  CO2 25 26  GLUCOSE 126* 114*  BUN 43* 40*  CREATININE 1.39* 1.29*  CALCIUM 8.1* 8.2*   PT/INR No results for input(s): LABPROT, INR in the last 72 hours. CMP     Component Value Date/Time   NA 136 09/27/2019 0216   K 3.5 09/27/2019 0216   CL 100 09/27/2019 0216   CO2 26 09/27/2019 0216   GLUCOSE 114 (H) 09/27/2019 0216   BUN 40 (H) 09/27/2019 0216   CREATININE 1.29 (H) 09/27/2019 0216   CALCIUM 8.2 (L) 09/27/2019 0216   PROT 6.2 (L) 09/15/2019 0922   ALBUMIN 3.1 (L) 09/15/2019 0922   AST 31 09/15/2019 0922   ALT 15 09/15/2019 0922   ALKPHOS 51 09/15/2019 0922   BILITOT 1.2 09/15/2019 0922   GFRNONAA 47 (L) 09/27/2019 0216   GFRAA 55 (L) 09/27/2019 0216   Lipase  No results found for: LIPASE     Studies/Results: No results  found.  Anti-infectives: Anti-infectives (From admission, onward)   None       Assessment/Plan Fall L rib fxs 7-11- pain control and pulm toilet, IS,repeat CXRyesterday stable L kidney lac with possible active extravasation and large hematoma -s/p IR embolization 10/14 - Crtrending down,urinemuch clearer ABL anemia- stable Atrial fibrillation- on xarelto (last dose 10/13 in PM) - hold xarelto, resumed other cardiac meds - would not recommend restarting xarelto given age and high fall potential Chronic aspiration- discussed with patient, maximize efforts to minimize aspiration but feed for comfort HTN HLD H/o colon cancer s/p partial colectomy ~2006 Ventral hernia 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 SNF pending insurance authorization   LOS: 13 days    Brigid Re , [facility] 09/28/2019, 8:05 AM Please see Amion for pager number during day hours 7:00am-4:30pm

## 2019-09-28 NOTE — TOC Progression Note (Signed)
Transition of Care [facility]) - Progression Note    Patient Details  Name: [name] [name] MRN: [id number] Date of Birth: 03/02/1923  Transition of Care [facility]) CM/SW [place], [place] Phone Number: [phone] 09/28/2019, 11:17 AM  Clinical Narrative:     CSW spoke with Bryson Ha at [facility] who reports that [employer] authorization is still pending at this time. Patient's decision maker [name] has been updated.   Expected Discharge Plan: [place] Barriers to Discharge: Continued Medical Work up, Ship broker  Expected Discharge Plan and Services Expected Discharge Plan: [place] In-house Referral: Clinical Social Work   Post Acute Care Choice: [place] Living arrangements for the past 2 months: Single Family Home                                       Social Determinants of Health (SDOH) Interventions    Readmission Risk Interventions No flowsheet data found.

## 2019-09-28 NOTE — Progress Notes (Signed)
REport given to nurse [facility] in [place]. Awaiting PTAR for transport.

## 2019-09-28 NOTE — Discharge Summary (Signed)
Physician Discharge Summary  Patient ID: [name] [name] MRN: [id number] DOB/AGE: 83-Nov-1924 83 y.o.  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 Pascal Lux  HPI: Patient is a 85 year old male PMH atrial fibrillation on xarelto (last dose 10/13 in PM), who presented to [facility] after suffering a ground level fall this morning around 0700. States that he tripped over his shoes and fell onto a clothes hamper. He struck his left chest then rolled onto the ground. He did also hit his head on the ground but denies headache or LOC. Unable to ambulate after fall. He was found shortly afterwards by a caregiver that checks on him a few times weekly and called EMS. Patient was worked up by [place] and found to have left rib fractures 7-11 and a left renal laceration with possible active extravasation. CT head and neck negative for acute injury. Per patient his blood pressure typically runs a little low, but now he is persistently with SBP 70-80s. Complaining only of left sided chest pain and mild abdominal pain. Denies nausea, vomiting, SOB. Pain is worse with deep inspiration. Lives at home alone, has multiple different caregivers that check on him periodically. Ambulates with a walker.   Hospital Course: Patient admitted to the trauma service and interventional radiology consulted for left kidney laceration. Patient underwent procedure as listed above and taken to the ICU for close observation after. Patient kept on bedrest initially but started mobilizing with therapies 10/16. After embolization of left  kidney, creatinine initially rose but UOP remained adequate and creatinine trended back down. Discussed SLP recommendations with patient and chronic aspiration and he prefers to accept risk of aspiration and continue to eat while maximizing efforts to prevent aspiration. Discussed remaining off anticoagulation with patient given falls and risk of future falls and he understands the risks vs benefits of this. He will discuss this further again with PCP on follow up. I discussed code status with patient who expressed that he would like to be made DNR, but understands that we will still treat the treatable. Patient worked with PT/OT throughout hospitalization and they recommended SNF upon discharge. On 09/28/19 patient was tolerating a diet, voiding appropriately, VSS, pain well controlled and overall felt stable for discharge. He is discharged to SNF in good condition with follow up as outlined below.   I did not see the patient personally during admission. Above was taken from chart review.  Allergies as of 09/28/2019      Reactions   Penicillins Other (See Comments)   Pt reports that he has tolerated oral PCN before but reports that his arm swelled where he received a PCN shot. Did it involve swelling of the face/tongue/throat, SOB, or low BP? Unknown Did it involve sudden or severe rash/hives, skin peeling, or any reaction on the inside of your mouth or nose? Unknown Did you need to seek medical attention at a hospital or doctor's office? Unknown When did it last happen? unk If all above answers are "NO", may proceed with cephalosporin use.      Medication List    STOP taking these medications   rivaroxaban 20 MG Tabs tablet Commonly known as: Xarelto  TAKE these medications   acetaminophen 325 MG tablet Commonly known as: TYLENOL Take 2 tablets (650 mg total) by mouth every 6 (six) hours.   atorvastatin 80 MG tablet Commonly known as: LIPITOR Take 80 mg by mouth daily.    B-complex with vitamin C tablet Take 1 tablet by mouth daily.   docusate sodium 100 MG capsule Commonly known as: COLACE Take 1 capsule (100 mg total) by mouth 2 (two) times daily.   ESTER C PO Take 1 tablet by mouth daily.   feeding supplement (ENSURE ENLIVE) Liqd Take 237 mLs by mouth 2 (two) times daily between meals.   glycopyrrolate 1 MG tablet Commonly known as: ROBINUL Take 1 tablet (1 mg total) by mouth 2 (two) times daily.   guaiFENesin 600 MG 12 hr tablet Commonly known as: MUCINEX Take 1 tablet (600 mg total) by mouth 2 (two) times daily as needed for to loosen phlegm.   losartan-hydrochlorothiazide 100-25 MG tablet Commonly known as: HYZAAR Take 1 tablet by mouth daily.   methocarbamol 500 MG tablet Commonly known as: ROBAXIN Take 1 tablet (500 mg total) by mouth every 8 (eight) hours as needed for muscle spasms.   metoprolol succinate 50 MG 24 hr tablet Commonly known as: TOPROL-XL Take 1 tablet (50 mg total) by mouth daily. Take with or immediately following a meal. What changed: how much to take   multivitamin with minerals Tabs tablet Take 1 tablet by mouth daily.   polyethylene glycol 17 g packet Commonly known as: MIRALAX / GLYCOLAX Take 17 g by mouth daily.   PRESERVISION AREDS 2 PO Take 1 tablet by mouth daily.   traMADol 50 MG tablet Commonly known as: ULTRAM Take 1 tablet (50 mg total) by mouth every 6 (six) hours as needed for moderate pain. What changed: how much to take         Contact information for follow-up providers    [name], MD. Call.   Specialty: Family Medicine Why: Call and schedule a follow up appointment 1-2 weeks from discharge for post-hospitalization visit. I would not recommend resumption of anticoagulation given fall and age, but you may discuss this further with your primary doctor.  Contact information: [id number] [place] [address]        [place]. Call.   Why:  Call as needed with questions or concerns.  Contact information: [place] [phone]           Contact information for after-discharge care    Destination    [facility] .   Service: Skilled Nursing Contact information: [address] [place] [phone]                  Signed: Brigid Re , [facility] 09/28/2019, 8:08 AM Please see Amion for pager number during day hours 7:00am-4:30pm

## 2019-09-28 NOTE — TOC Transition Note (Signed)
Transition of Care [facility]) - CM/SW Discharge Note   Patient Details  Name: [name] [name] MRN: [id number] Date of Birth: 1923-02-04  Transition of Care [facility]) CM/SW Contact:  Alberteen Sam, [place] Phone Number: [phone] 09/28/2019, 3:08 PM   Clinical Narrative:     Patient will DC to: [place] Anticipated DC date: 09/28/2019 Family notified: [name] Transport by: Corey Harold  Per MD patient ready for DC to [facility]  . RN, patient, patient's family, and facility notified of DC. Discharge Summary sent to facility. RN given number for report   [phone] Room 125. DC packet on chart. Ambulance transport requested for patient for 4:30 pm per RN request. [place] signing off.  [place], [place]   Final next level of care: Skilled Nursing Facility Barriers to Discharge: No Barriers Identified   Patient Goals and CMS Choice Patient states their goals for this hospitalization and ongoing recovery are:: to be home CMS Medicare.gov Compare Post Acute Care list provided to:: Patient Represenative (must comment)([place]) Choice offered to / list presented to : [name])  Discharge Placement PASRR number recieved: 09/19/19            Patient chooses bed at: Other - please specify in the comment section below:( [place]) Patient to be transferred to facility by: [place] Name of family member notified: [name] Patient and family notified of of transfer: 09/28/19  Discharge Plan and Services In-house Referral: Clinical Social Work   Post Acute Care Choice: [place]                               Social Determinants of Health (SDOH) Interventions     Readmission Risk Interventions No flowsheet data found.

## 2019-09-30 ENCOUNTER — Encounter (HOSPITAL_COMMUNITY): Payer: Self-pay

## 2019-09-30 ENCOUNTER — Inpatient Hospital Stay (HOSPITAL_COMMUNITY)
Admission: EM | Admit: 2019-09-30 | Discharge: 2019-10-08 | DRG: 291 | Disposition: A | Discharge status: Skilled Nursing Facility | Payer: Medicare HMO | Source: Skilled Nursing Facility | Attending: Internal Medicine | Admitting: Internal Medicine

## 2019-09-30 ENCOUNTER — Other Ambulatory Visit: Payer: Self-pay

## 2019-09-30 DIAGNOSIS — E785 Hyperlipidemia, unspecified: Secondary | ICD-10-CM | POA: Diagnosis present

## 2019-09-30 DIAGNOSIS — Z6833 Body mass index (BMI) 33.0-33.9, adult: Secondary | ICD-10-CM

## 2019-09-30 DIAGNOSIS — I10 Essential (primary) hypertension: Secondary | ICD-10-CM | POA: Diagnosis present

## 2019-09-30 DIAGNOSIS — I482 Chronic atrial fibrillation, unspecified: Secondary | ICD-10-CM | POA: Diagnosis present

## 2019-09-30 DIAGNOSIS — R6510 Systemic inflammatory response syndrome (SIRS) of non-infectious origin without acute organ dysfunction: Secondary | ICD-10-CM | POA: Diagnosis present

## 2019-09-30 DIAGNOSIS — Z8249 Family history of ischemic heart disease and other diseases of the circulatory system: Secondary | ICD-10-CM

## 2019-09-30 DIAGNOSIS — H9190 Unspecified hearing loss, unspecified ear: Secondary | ICD-10-CM | POA: Diagnosis present

## 2019-09-30 DIAGNOSIS — K219 Gastro-esophageal reflux disease without esophagitis: Secondary | ICD-10-CM | POA: Diagnosis present

## 2019-09-30 DIAGNOSIS — S2249XA Multiple fractures of ribs, unspecified side, initial encounter for closed fracture: Secondary | ICD-10-CM | POA: Diagnosis present

## 2019-09-30 DIAGNOSIS — I878 Other specified disorders of veins: Secondary | ICD-10-CM | POA: Diagnosis present

## 2019-09-30 DIAGNOSIS — N179 Acute kidney failure, unspecified: Secondary | ICD-10-CM

## 2019-09-30 DIAGNOSIS — S2242XS Multiple fractures of ribs, left side, sequela: Secondary | ICD-10-CM

## 2019-09-30 DIAGNOSIS — R0600 Dyspnea, unspecified: Secondary | ICD-10-CM

## 2019-09-30 DIAGNOSIS — I959 Hypotension, unspecified: Secondary | ICD-10-CM | POA: Diagnosis present

## 2019-09-30 DIAGNOSIS — R0682 Tachypnea, not elsewhere classified: Secondary | ICD-10-CM | POA: Diagnosis not present

## 2019-09-30 DIAGNOSIS — S37032A Laceration of left kidney, unspecified degree, initial encounter: Secondary | ICD-10-CM | POA: Diagnosis present

## 2019-09-30 DIAGNOSIS — I509 Heart failure, unspecified: Secondary | ICD-10-CM

## 2019-09-30 DIAGNOSIS — I4891 Unspecified atrial fibrillation: Secondary | ICD-10-CM | POA: Diagnosis present

## 2019-09-30 DIAGNOSIS — I130 Hypertensive heart and chronic kidney disease with heart failure and stage 1 through stage 4 chronic kidney disease, or unspecified chronic kidney disease: Principal | ICD-10-CM | POA: Diagnosis present

## 2019-09-30 DIAGNOSIS — J9601 Acute respiratory failure with hypoxia: Secondary | ICD-10-CM | POA: Diagnosis present

## 2019-09-30 DIAGNOSIS — Z20828 Contact with and (suspected) exposure to other viral communicable diseases: Secondary | ICD-10-CM | POA: Diagnosis present

## 2019-09-30 DIAGNOSIS — W06XXXS Fall from bed, sequela: Secondary | ICD-10-CM

## 2019-09-30 DIAGNOSIS — Z85038 Personal history of other malignant neoplasm of large intestine: Secondary | ICD-10-CM

## 2019-09-30 DIAGNOSIS — E440 Moderate protein-calorie malnutrition: Secondary | ICD-10-CM | POA: Diagnosis present

## 2019-09-30 DIAGNOSIS — N183 Chronic kidney disease, stage 3 unspecified: Secondary | ICD-10-CM | POA: Diagnosis present

## 2019-09-30 DIAGNOSIS — E860 Dehydration: Secondary | ICD-10-CM | POA: Diagnosis present

## 2019-09-30 DIAGNOSIS — I5041 Acute combined systolic (congestive) and diastolic (congestive) heart failure: Secondary | ICD-10-CM | POA: Diagnosis present

## 2019-09-30 DIAGNOSIS — R109 Unspecified abdominal pain: Secondary | ICD-10-CM | POA: Diagnosis present

## 2019-09-30 DIAGNOSIS — Z9049 Acquired absence of other specified parts of digestive tract: Secondary | ICD-10-CM

## 2019-09-30 DIAGNOSIS — Z87891 Personal history of nicotine dependence: Secondary | ICD-10-CM

## 2019-09-30 DIAGNOSIS — S37062S Major laceration of left kidney, sequela: Secondary | ICD-10-CM

## 2019-09-30 DIAGNOSIS — Z66 Do not resuscitate: Secondary | ICD-10-CM | POA: Diagnosis present

## 2019-09-30 DIAGNOSIS — Z9181 History of falling: Secondary | ICD-10-CM

## 2019-09-30 DIAGNOSIS — I50810 Right heart failure, unspecified: Secondary | ICD-10-CM | POA: Diagnosis present

## 2019-09-30 NOTE — ED Triage Notes (Signed)
Pt is from a facility and tonight he called EMS for abdominal pain, the staff says that he possibly aspirated yesterday but the provider didn't want an xray  Pt is warm to touch Pt on 4 liters of O2

## 2019-10-01 ENCOUNTER — Encounter (HOSPITAL_COMMUNITY): Payer: Self-pay

## 2019-10-01 ENCOUNTER — Emergency Department (HOSPITAL_COMMUNITY): Payer: Medicare HMO

## 2019-10-01 ENCOUNTER — Other Ambulatory Visit: Payer: Self-pay

## 2019-10-01 DIAGNOSIS — I482 Chronic atrial fibrillation, unspecified: Secondary | ICD-10-CM | POA: Diagnosis not present

## 2019-10-01 DIAGNOSIS — S2242XS Multiple fractures of ribs, left side, sequela: Secondary | ICD-10-CM

## 2019-10-01 DIAGNOSIS — W06XXXS Fall from bed, sequela: Secondary | ICD-10-CM

## 2019-10-01 DIAGNOSIS — R6510 Systemic inflammatory response syndrome (SIRS) of non-infectious origin without acute organ dysfunction: Secondary | ICD-10-CM | POA: Diagnosis not present

## 2019-10-01 DIAGNOSIS — R109 Unspecified abdominal pain: Secondary | ICD-10-CM | POA: Diagnosis present

## 2019-10-01 DIAGNOSIS — R1084 Generalized abdominal pain: Secondary | ICD-10-CM

## 2019-10-01 DIAGNOSIS — S2249XA Multiple fractures of ribs, unspecified side, initial encounter for closed fracture: Secondary | ICD-10-CM | POA: Diagnosis present

## 2019-10-01 DIAGNOSIS — S37032D Laceration of left kidney, unspecified degree, subsequent encounter: Secondary | ICD-10-CM

## 2019-10-01 DIAGNOSIS — I10 Essential (primary) hypertension: Secondary | ICD-10-CM

## 2019-10-01 DIAGNOSIS — S2242XD Multiple fractures of ribs, left side, subsequent encounter for fracture with routine healing: Secondary | ICD-10-CM

## 2019-10-01 DIAGNOSIS — N183 Chronic kidney disease, stage 3 unspecified: Secondary | ICD-10-CM

## 2019-10-01 LAB — BASIC METABOLIC PANEL
Anion gap: 5 (ref 5–15)
Anion gap: 7 (ref 5–15)
BUN: 32 mg/dL — ABNORMAL HIGH (ref 8–23)
BUN: 39 mg/dL — ABNORMAL HIGH (ref 8–23)
CO2: 19 mmol/L — ABNORMAL LOW (ref 22–32)
CO2: 24 mmol/L (ref 22–32)
Calcium: 5.4 mg/dL — CL (ref 8.9–10.3)
Calcium: 6.8 mg/dL — ABNORMAL LOW (ref 8.9–10.3)
Chloride: 114 mmol/L — ABNORMAL HIGH (ref 98–111)
Chloride: 105 mmol/L (ref 98–111)
Creatinine, Ser: 0.85 mg/dL (ref 0.61–1.24)
Creatinine, Ser: 1.08 mg/dL (ref 0.61–1.24)
GFR calc Af Amer: >60 mL/min (ref >60)
GFR calc Af Amer: >60 mL/min (ref >60)
GFR calc non Af Amer: >60 mL/min (ref >60)
GFR calc non Af Amer: 58 mL/min — ABNORMAL LOW (ref >60)
Glucose, Bld: 81 mg/dL (ref 70–99)
Glucose, Bld: 101 mg/dL — ABNORMAL HIGH (ref 70–99)
Potassium: 2.6 mmol/L — CL (ref 3.5–5.1)
Potassium: 3.3 mmol/L — ABNORMAL LOW (ref 3.5–5.1)
Sodium: 138 mmol/L (ref 135–145)
Sodium: 136 mmol/L (ref 135–145)

## 2019-10-01 LAB — APTT: aPTT: 31 seconds (ref 24–36)

## 2019-10-01 LAB — PROTIME-INR
INR: 1.1 (ref 0.8–1.2)
Prothrombin Time: 13.9 seconds (ref 11.4–15.2)

## 2019-10-01 LAB — URINALYSIS, ROUTINE W REFLEX MICROSCOPIC
Bilirubin Urine: NEGATIVE
Glucose, UA: NEGATIVE mg/dL
Hgb urine dipstick: NEGATIVE
Ketones, ur: NEGATIVE mg/dL
Leukocytes,Ua: NEGATIVE
Nitrite: NEGATIVE
Protein, ur: NEGATIVE mg/dL
Specific Gravity, Urine: 1.024 (ref 1.005–1.030)
pH: 5 (ref 5.0–8.0)

## 2019-10-01 LAB — CBC
HCT: 34.9 % — ABNORMAL LOW (ref 39.0–52.0)
HCT: 32.9 % — ABNORMAL LOW (ref 39.0–52.0)
Hemoglobin: 11 g/dL — ABNORMAL LOW (ref 13.0–17.0)
Hemoglobin: 10.2 g/dL — ABNORMAL LOW (ref 13.0–17.0)
MCH: 32.4 pg (ref 26.0–34.0)
MCH: 32 pg (ref 26.0–34.0)
MCHC: 31.5 g/dL (ref 30.0–36.0)
MCHC: 31 g/dL (ref 30.0–36.0)
MCV: 102.6 fL — ABNORMAL HIGH (ref 80.0–100.0)
MCV: 103.1 fL — ABNORMAL HIGH (ref 80.0–100.0)
Platelets: 291 10*3/uL (ref 150–400)
Platelets: 330 10*3/uL (ref 150–400)
RBC: 3.4 MIL/uL — ABNORMAL LOW (ref 4.22–5.81)
RBC: 3.19 MIL/uL — ABNORMAL LOW (ref 4.22–5.81)
RDW: 15.5 % (ref 11.5–15.5)
RDW: 15.5 % (ref 11.5–15.5)
WBC: 9.1 10*3/uL (ref 4.0–10.5)
WBC: 10.9 10*3/uL — ABNORMAL HIGH (ref 4.0–10.5)
nRBC: 0 % (ref 0.0–0.2)
nRBC: 0 % (ref 0.0–0.2)

## 2019-10-01 LAB — CBC WITH DIFFERENTIAL/PLATELET
Abs Immature Granulocytes: 0.12 10*3/uL — ABNORMAL HIGH (ref 0.00–0.07)
Basophils Absolute: 0.1 10*3/uL (ref 0.0–0.1)
Basophils Relative: 1 %
Eosinophils Absolute: 0.1 10*3/uL (ref 0.0–0.5)
Eosinophils Relative: 1 %
HCT: 34.6 % — ABNORMAL LOW (ref 39.0–52.0)
Hemoglobin: 11 g/dL — ABNORMAL LOW (ref 13.0–17.0)
Immature Granulocytes: 1 %
Lymphocytes Relative: 13 %
Lymphs Abs: 1.6 10*3/uL (ref 0.7–4.0)
MCH: 31.9 pg (ref 26.0–34.0)
MCHC: 31.8 g/dL (ref 30.0–36.0)
MCV: 100.3 fL — ABNORMAL HIGH (ref 80.0–100.0)
Monocytes Absolute: 1.2 10*3/uL — ABNORMAL HIGH (ref 0.1–1.0)
Monocytes Relative: 10 %
Neutro Abs: 9.1 10*3/uL — ABNORMAL HIGH (ref 1.7–7.7)
Neutrophils Relative %: 74 %
Platelets: 358 10*3/uL (ref 150–400)
RBC: 3.45 MIL/uL — ABNORMAL LOW (ref 4.22–5.81)
RDW: 15.7 % — ABNORMAL HIGH (ref 11.5–15.5)
WBC: 12.3 10*3/uL — ABNORMAL HIGH (ref 4.0–10.5)
nRBC: 0 % (ref 0.0–0.2)

## 2019-10-01 LAB — COMPREHENSIVE METABOLIC PANEL
ALT: 32 U/L (ref 0–44)
AST: 49 U/L — ABNORMAL HIGH (ref 15–41)
Albumin: 2.4 g/dL — ABNORMAL LOW (ref 3.5–5.0)
Alkaline Phosphatase: 76 U/L (ref 38–126)
Anion gap: 9 (ref 5–15)
BUN: 46 mg/dL — ABNORMAL HIGH (ref 8–23)
CO2: 26 mmol/L (ref 22–32)
Calcium: 8.2 mg/dL — ABNORMAL LOW (ref 8.9–10.3)
Chloride: 97 mmol/L — ABNORMAL LOW (ref 98–111)
Creatinine, Ser: 1.44 mg/dL — ABNORMAL HIGH (ref 0.61–1.24)
GFR calc Af Amer: 48 mL/min — ABNORMAL LOW (ref >60)
GFR calc non Af Amer: 41 mL/min — ABNORMAL LOW (ref >60)
Glucose, Bld: 114 mg/dL — ABNORMAL HIGH (ref 70–99)
Potassium: 4.2 mmol/L (ref 3.5–5.1)
Sodium: 132 mmol/L — ABNORMAL LOW (ref 135–145)
Total Bilirubin: 1.6 mg/dL — ABNORMAL HIGH (ref 0.3–1.2)
Total Protein: 5.8 g/dL — ABNORMAL LOW (ref 6.5–8.1)

## 2019-10-01 LAB — SARS CORONAVIRUS 2 (TAT 6-24 HRS): SARS Coronavirus 2: NEGATIVE

## 2019-10-01 LAB — PROCALCITONIN: Procalcitonin: <0.1 ng/mL

## 2019-10-01 LAB — LACTIC ACID, PLASMA: Lactic Acid, Venous: 1.2 mmol/L (ref 0.5–1.9)

## 2019-10-01 MED ORDER — POLYETHYLENE GLYCOL 3350 17 G PO PACK
17.0000 g | PACK | Freq: Every day | ORAL | Status: DC
  Administered 2019-10-01 – 2019-10-08 (×8): 17 g via ORAL
  Filled 2019-10-01 (×8): qty 1

## 2019-10-01 MED ORDER — ADULT MULTIVITAMIN W/MINERALS CH
1.0000 | ORAL_TABLET | Freq: Every day | ORAL | Status: DC
  Administered 2019-10-01 – 2019-10-08 (×8): 1 via ORAL
  Filled 2019-10-01 (×8): qty 1

## 2019-10-01 MED ORDER — ATORVASTATIN CALCIUM 40 MG PO TABS
80.0000 mg | ORAL_TABLET | Freq: Every day | ORAL | Status: DC
  Administered 2019-10-01 – 2019-10-08 (×8): 80 mg via ORAL
  Filled 2019-10-01: qty 1
  Filled 2019-10-01 (×7): qty 2

## 2019-10-01 MED ORDER — ACETAMINOPHEN 325 MG PO TABS
650.0000 mg | ORAL_TABLET | Freq: Four times a day (QID) | ORAL | Status: DC | PRN
  Administered 2019-10-03 – 2019-10-05 (×3): 650 mg via ORAL
  Filled 2019-10-01 (×3): qty 2

## 2019-10-01 MED ORDER — SODIUM CHLORIDE 0.9 % IV BOLUS: 2000.0000 mL | Freq: Once | INTRAVENOUS | Status: DC

## 2019-10-01 MED ORDER — GUAIFENESIN ER 600 MG PO TB12: 600.0000 mg | ORAL_TABLET | Freq: Two times a day (BID) | ORAL | Status: DC | PRN

## 2019-10-01 MED ORDER — ACETAMINOPHEN 650 MG RE SUPP: 650.0000 mg | Freq: Four times a day (QID) | RECTAL | Status: DC | PRN

## 2019-10-01 MED ORDER — SODIUM CHLORIDE 0.9 % IV SOLN: 500.0000 mg | Freq: Once | INTRAVENOUS | Status: DC

## 2019-10-01 MED ORDER — IOHEXOL 350 MG/ML SOLN: 100.0000 mL | Freq: Once | INTRAVENOUS | Status: DC | PRN

## 2019-10-01 MED ORDER — VANCOMYCIN HCL IN DEXTROSE 1-5 GM/200ML-% IV SOLN
1000.0000 mg | Freq: Once | INTRAVENOUS | Status: AC
  Administered 2019-10-01: 04:00:00 1000 mg via INTRAVENOUS
  Filled 2019-10-01: qty 200

## 2019-10-01 MED ORDER — MORPHINE SULFATE (PF) 2 MG/ML IV SOLN: 2.0000 mg | INTRAVENOUS | Status: DC | PRN

## 2019-10-01 MED ORDER — SODIUM CHLORIDE (PF) 0.9 % IJ SOLN
INTRAMUSCULAR | Status: AC
  Administered 2019-10-01: 11:00:00 10 mL
  Filled 2019-10-01: qty 50

## 2019-10-01 MED ORDER — SODIUM CHLORIDE 0.9 % IV SOLN
1000.0000 mL | INTRAVENOUS | Status: DC
  Administered 2019-10-01 – 2019-10-02 (×3): 1000 mL via INTRAVENOUS

## 2019-10-01 MED ORDER — METOPROLOL SUCCINATE ER 25 MG PO TB24
25.0000 mg | ORAL_TABLET | Freq: Every day | ORAL | Status: DC
  Administered 2019-10-01 – 2019-10-08 (×8): 25 mg via ORAL
  Filled 2019-10-01 (×8): qty 1

## 2019-10-01 MED ORDER — METHOCARBAMOL 500 MG PO TABS: 500.0000 mg | ORAL_TABLET | Freq: Three times a day (TID) | ORAL | Status: DC | PRN

## 2019-10-01 MED ORDER — POTASSIUM CHLORIDE CRYS ER 20 MEQ PO TBCR
20.0000 meq | EXTENDED_RELEASE_TABLET | Freq: Two times a day (BID) | ORAL | Status: AC
  Administered 2019-10-01 – 2019-10-02 (×4): 20 meq via ORAL
  Filled 2019-10-01 (×4): qty 1

## 2019-10-01 MED ORDER — ONDANSETRON HCL 4 MG PO TABS: 4.0000 mg | ORAL_TABLET | Freq: Four times a day (QID) | ORAL | Status: DC | PRN

## 2019-10-01 MED ORDER — GLYCOPYRROLATE 1 MG PO TABS
1.0000 mg | ORAL_TABLET | Freq: Two times a day (BID) | ORAL | Status: DC
  Administered 2019-10-01 – 2019-10-08 (×15): 1 mg via ORAL
  Filled 2019-10-01 (×18): qty 1

## 2019-10-01 MED ORDER — ONDANSETRON HCL 4 MG/2ML IJ SOLN: 4.0000 mg | Freq: Four times a day (QID) | INTRAMUSCULAR | Status: DC | PRN

## 2019-10-01 MED ORDER — ENSURE ENLIVE PO LIQD
237.0000 mL | Freq: Two times a day (BID) | ORAL | Status: DC
  Administered 2019-10-01 – 2019-10-08 (×13): 237 mL via ORAL
  Filled 2019-10-01 (×2): qty 237

## 2019-10-01 MED ORDER — TRAMADOL HCL 50 MG PO TABS
50.0000 mg | ORAL_TABLET | Freq: Four times a day (QID) | ORAL | Status: DC | PRN
  Administered 2019-10-02 – 2019-10-07 (×2): 50 mg via ORAL
  Filled 2019-10-01 (×2): qty 1

## 2019-10-01 MED ORDER — SODIUM CHLORIDE 0.9 % IV BOLUS
1000.0000 mL | Freq: Once | INTRAVENOUS | Status: AC
  Administered 2019-10-01: 05:00:00 1000 mL via INTRAVENOUS

## 2019-10-01 MED ORDER — IOHEXOL 300 MG/ML  SOLN
80.0000 mL | Freq: Once | INTRAMUSCULAR | Status: AC | PRN
  Administered 2019-10-01: 02:00:00 80 mL via INTRAVENOUS

## 2019-10-01 MED ORDER — SODIUM CHLORIDE 0.9 % IV SOLN
1.0000 g | Freq: Once | INTRAVENOUS | Status: AC
  Administered 2019-10-01: 1 g via INTRAVENOUS
  Filled 2019-10-01: qty 10

## 2019-10-01 NOTE — ED Notes (Signed)
[name]) at bedside.

## 2019-10-01 NOTE — ED Notes (Addendum)
Patient had 1 smear of BM. Mostly gas, Patient states he feels much better after passing gas. Patient urinated in urinal 330mLs. UA sent off for collection.     Patient denies being DNR. Dante Gang, MD made aware and will be down shortly to speak with patient. Paper work from facility states patients is full code.

## 2019-10-01 NOTE — ED Notes (Signed)
Patient called out for BM and to urinate. Patient placed on bed pan.

## 2019-10-01 NOTE — ED Notes (Signed)
Messaged pharmacy to send up 1000 meds when possible

## 2019-10-01 NOTE — ED Provider Notes (Signed)
[facility] DEPT Provider Note: Georgena Spurling, MD, FACEP  CSN: [id number] MRN: [id number] ARRIVAL: 09/30/19 at 2343 ROOM: [place]  Abdominal Pain   HISTORY OF PRESENT ILLNESS  10/01/19 12:02 AM [name] [name] is a 85 y.o. male who rolled out of his bed at his nursing home and suffered left-sided rib fractures and a left kidney rupture requiring embolization.  He was admitted to the [facility] trauma service 09/15/2019 and discharged 09/28/2019.  He is here this morning after calling 911 himself for abdominal pain.  He describes the abdominal pain as diffuse and not severe but "somewhat bad".  It is worse with movement or palpation.  EMS reports a fever at the nursing home yesterday.  His temperature on arrival was 99 4 orally, rectal temp is pending EMS also reports staff concerns that he aspirated yesterday.  He denies being short of breath.   Past Medical History:  Diagnosis Date   Arthritis    Atrial fibrillation ([place])    Cancer ([place])    colon ca - surgery, no chemo or radiation   Hypertension    Right shoulder pain 09/30/12   recently    Past Surgical History:  Procedure Laterality Date   CATARACT EXTRACTION W/ INTRAOCULAR LENS IMPLANT     b/l   colon surgery for colon cancer  approx. 2006   IR EMBO ART  VEN HEMORR LYMPH EXTRAV  INC GUIDE ROADMAPPING  09/15/2019   IR FLUORO GUIDE CV LINE RIGHT  09/15/2019   IR RENAL SUPRASEL UNI S&I MOD SED  09/15/2019   IR US GUIDE VASC ACCESS RIGHT  09/15/2019   IR US GUIDE VASC ACCESS RIGHT  09/15/2019    Family History  Problem Relation Age of Onset   Heart attack Father     Social History   Tobacco Use   Smoking status: Former Smoker    Years: 35.00    Types: Cigarettes    Quit date: 09/30/1989    Years since quitting: 30.0   Smokeless tobacco: Never Used  Substance Use Topics   Alcohol use: No    Comment: quit drinking about 27 years ago   Drug use: No    Prior to Admission  medications   Medication Sig Start Date End Date Taking? Authorizing Provider  acetaminophen (TYLENOL) 325 MG tablet Take 2 tablets (650 mg total) by mouth every 6 (six) hours. Patient taking differently: Take 650 mg by mouth every 6 (six) hours as needed for moderate pain.  09/28/19  Yes Rayburn, Claiborne Billings A, PA-C  albuterol (PROVENTIL) (2.5 MG/3ML) 0.083% nebulizer solution Take 2.5 mg by nebulization every 6 (six) hours as needed for wheezing or shortness of breath.   Yes [provider]  atorvastatin (LIPITOR) 80 MG tablet Take 80 mg by mouth daily. 07/28/19  Yes [provider]  docusate sodium (COLACE) 100 MG capsule Take 1 capsule (100 mg total) by mouth 2 (two) times daily. 09/28/19  Yes Rayburn, Claiborne Billings A, PA-C  feeding supplement, ENSURE ENLIVE, (ENSURE ENLIVE) LIQD Take 237 mLs by mouth 2 (two) times daily between meals. 09/28/19  Yes Rayburn, Claiborne Billings A, PA-C  glycopyrrolate (ROBINUL) 1 MG tablet Take 1 tablet (1 mg total) by mouth 2 (two) times daily. 09/28/19  Yes Rayburn, Floyce Stakes, PA-C  guaiFENesin (MUCINEX) 600 MG 12 hr tablet Take 1 tablet (600 mg total) by mouth 2 (two) times daily as needed for to loosen phlegm. 09/28/19  Yes Rayburn, Floyce Stakes, PA-C  losartan-hydrochlorothiazide [facility])  100-25 MG per tablet Take 1 tablet by mouth daily.    Yes [provider]  methocarbamol (ROBAXIN) 500 MG tablet Take 1 tablet (500 mg total) by mouth every 8 (eight) hours as needed for muscle spasms. 09/28/19  Yes Rayburn, Claiborne Billings A, PA-C  metoprolol succinate (TOPROL-XL) 50 MG 24 hr tablet Take 1 tablet (50 mg total) by mouth daily. Take with or immediately following a meal. 10/01/12  Yes Bynum Bellows, MD  Multiple Vitamin (MULTIVITAMIN WITH MINERALS) TABS tablet Take 1 tablet by mouth daily.   Yes [provider]  polyethylene glycol (MIRALAX / GLYCOLAX) 17 g packet Take 17 g by mouth daily. 09/28/19  Yes Rayburn, Floyce Stakes, PA-C  traMADol (ULTRAM) 50 MG tablet Take 1  tablet (50 mg total) by mouth every 6 (six) hours as needed for moderate pain. 09/28/19  Yes Rayburn, Claiborne Billings A, PA-C    Allergies Penicillins   REVIEW OF SYSTEMS  Negative except as noted here or in the History of Present Illness.   PHYSICAL EXAMINATION  Initial Vital Signs Blood pressure (!) 191/140, pulse (!) 104, temperature 99.4 F (37.4 C), temperature source Oral, resp. rate (!) 24, height 6' (1.829 m), weight 112.9 kg, SpO2 95 %.  Examination General: Well-developed, well-nourished male in no acute distress; appearance consistent with age of record HENT: normocephalic; atraumatic Eyes: pupils equal, round and reactive to light; extraocular muscles intact Neck: supple Heart: Irregular rhythm Lungs: clear to auscultation bilaterally Abdomen: soft; nondistended; diffusely tender; midline incisional hernia; bowel sounds present Extremities: No deformity; full range of motion; pulses normal Neurologic: Awake, alert and oriented x 2; motor function intact in all extremities and symmetric; no facial droop Skin: Warm and dry Psychiatric: Normal mood and affect   RESULTS  Summary of this visit's results, reviewed and interpreted by myself:   EKG Interpretation  Date/Time:  Friday October 01 2019 00:03:04 EDT Ventricular Rate:  96 PR Interval:    QRS Duration: 110 QT Interval:  358 QTC Calculation: 436 R Axis:   -60 Text Interpretation: Atrial fibrillation Left anterior fascicular block Abnormal R-wave progression, early transition Confirmed by Lavella Myren, Jenny Reichmann [phone]) on 10/01/2019 12:09:05 AM      Laboratory Studies: Results for orders placed or performed during the hospital encounter of 09/30/19 (from the past 24 hour(s))  Lactic acid, plasma     Status: None   Collection Time: 10/01/19 12:11 AM  Result Value Ref Range   Lactic Acid, Venous 1.2 0.5 - 1.9 mmol/L  Comprehensive metabolic panel     Status: Abnormal   Collection Time: 10/01/19 12:11 AM  Result Value Ref  Range   Sodium 132 (L) 135 - 145 mmol/L   Potassium 4.2 3.5 - 5.1 mmol/L   Chloride 97 (L) 98 - 111 mmol/L   CO2 26 22 - 32 mmol/L   Glucose, Bld 114 (H) 70 - 99 mg/dL   BUN 46 (H) 8 - 23 mg/dL   Creatinine, Ser 1.44 (H) 0.61 - 1.24 mg/dL   Calcium 8.2 (L) 8.9 - 10.3 mg/dL   Total Protein 5.8 (L) 6.5 - 8.1 g/dL   Albumin 2.4 (L) 3.5 - 5.0 g/dL   AST 49 (H) 15 - 41 U/L   ALT 32 0 - 44 U/L   Alkaline Phosphatase 76 38 - 126 U/L   Total Bilirubin 1.6 (H) 0.3 - 1.2 mg/dL   GFR calc non Af Amer 41 (L) >60 mL/min   GFR calc Af Amer 48 (L) >60 mL/min  Anion gap 9 5 - 15  CBC WITH DIFFERENTIAL     Status: Abnormal   Collection Time: 10/01/19 12:11 AM  Result Value Ref Range   WBC 12.3 (H) 4.0 - 10.5 K/uL   RBC 3.45 (L) 4.22 - 5.81 MIL/uL   Hemoglobin 11.0 (L) 13.0 - 17.0 g/dL   HCT 34.6 (L) 39.0 - 52.0 %   MCV 100.3 (H) 80.0 - 100.0 fL   MCH 31.9 26.0 - 34.0 pg   MCHC 31.8 30.0 - 36.0 g/dL   RDW 15.7 (H) 11.5 - 15.5 %   Platelets 358 150 - 400 K/uL   nRBC 0.0 0.0 - 0.2 %   Neutrophils Relative % 74 %   Neutro Abs 9.1 (H) 1.7 - 7.7 K/uL   Lymphocytes Relative 13 %   Lymphs Abs 1.6 0.7 - 4.0 K/uL   Monocytes Relative 10 %   Monocytes Absolute 1.2 (H) 0.1 - 1.0 K/uL   Eosinophils Relative 1 %   Eosinophils Absolute 0.1 0.0 - 0.5 K/uL   Basophils Relative 1 %   Basophils Absolute 0.1 0.0 - 0.1 K/uL   Immature Granulocytes 1 %   Abs Immature Granulocytes 0.12 (H) 0.00 - 0.07 K/uL  APTT     Status: None   Collection Time: 10/01/19 12:11 AM  Result Value Ref Range   aPTT 31 24 - 36 seconds  Protime-INR     Status: None   Collection Time: 10/01/19 12:11 AM  Result Value Ref Range   Prothrombin Time 13.9 11.4 - 15.2 seconds   INR 1.1 0.8 - 1.2  Blood Culture (routine x 2)     Status: None (Preliminary result)   Collection Time: 10/01/19 12:11 AM   Specimen: BLOOD RIGHT FOREARM  Result Value Ref Range   Specimen Description      BLOOD RIGHT FOREARM Performed at [facility], [address], [place], [place] [address]    Special Requests      BOTTLES DRAWN AEROBIC AND ANAEROBIC Blood Culture results may not be optimal due to an inadequate volume of blood received in culture bottles Performed at [facility], [place] [address] [place], [place] [address]    Culture PENDING    Report Status PENDING   Procalcitonin     Status: None   Collection Time: 10/01/19 12:11 AM  Result Value Ref Range   Procalcitonin <0.10 ng/mL  Blood Culture (routine x 2)     Status: None (Preliminary result)   Collection Time: 10/01/19 12:16 AM   Specimen: BLOOD LEFT WRIST  Result Value Ref Range   Specimen Description      BLOOD LEFT WRIST Performed at [facility], [place] [address], [place], [place] [address]    Special Requests      BOTTLES DRAWN AEROBIC AND ANAEROBIC Blood Culture adequate volume Performed at [place] [address], [place],  [address]    Culture PENDING    Report Status PENDING    Imaging Studies: Ct Abdomen Pelvis W Contrast  Result Date: 10/01/2019 CLINICAL DATA:  Abdominal pain, history of recent renal injury on the left initial encounter EXAM: CT ABDOMEN AND PELVIS WITH CONTRAST TECHNIQUE: Multidetector CT imaging of the abdomen and pelvis was performed using the standard protocol following bolus administration of intravenous contrast. CONTRAST:  41mL OMNIPAQUE IOHEXOL 300 MG/ML  SOLN COMPARISON:  09/15/2019 FINDINGS: Lower chest: Small left pleural effusion is noted with associated atelectatic changes slightly larger than that  seen on the prior exam. Small right-sided pleural effusion is noted with atelectatic changes as well. Hepatobiliary: Multiple gallstones are noted within the gallbladder. No wall thickening or pericholecystic fluid is noted. The liver is within normal limits and stable. Pancreas: Unremarkable. No pancreatic ductal dilatation or surrounding inflammatory changes. Spleen:  Normal in size without focal abnormality. Adrenals/Urinary Tract: Adrenal glands are within normal limits. Right kidney demonstrates a normal enhancement pattern with cystic change stable in appearance from the prior exam. No renal calculi or obstructive changes are noted. The bladder is partially distended. The left kidney again demonstrates subcapsular hematoma. This measures approximately 8.7 x 3.4 cm in dimension with mixed attenuation consistent with a more subacute process. No acute hemorrhage is seen. Mass effect upon the left kidney is noted with some decreased enhancement seen in the mid to lower pole posteriorly stable from the prior study. Changes of prior renal artery embolization are noted. Perinephric stranding on the left is seen. Area of previous hemorrhage along the left psoas muscle is noted. This is stable from the prior exam. Stomach/Bowel: Mild retained fecal material is noted within the colon. No obstructive changes are seen. The appendix is not well visualized. No inflammatory changes are seen. There are multiple loops of small bowel identified within an anterior abdominal wall hernia stable from the prior exam. No obstructive changes are seen. The stomach is within normal limits. Vascular/Lymphatic: Aortic atherosclerosis. 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 No obstructive changes are seen. No enlarged abdominal or pelvic lymph nodes. Reproductive: Prostate is unremarkable. Other: No abdominal wall hernia or abnormality. No abdominopelvic ascites. Musculoskeletal: Degenerative changes of lumbar spine are noted. Fractures of the posterolateral aspects of the left ninth, tenth and eleventh ribs are seen better visualized on than on the prior exam. IMPRESSION: Changes consistent with the known prior left perirenal hematoma. Mass effect on the left kidney is noted with decreased perfusion inferiorly which may represent changes of [place] kidney. No findings to suggest  active extravasation are noted. Prior embolization is seen. Stable dilatation of the infrarenal aorta. Anterior abdominal wall hernia with multiple loops of small bowel are noted within no obstructive changes are noted. Fractures of the left ninth through eleventh ribs posteriorly. Cholelithiasis. Bilateral pleural effusions left greater than right. Electronically Signed   By: Inez Catalina M.D.   On: 10/01/2019 02:04   Dg Chest Port 1 View  Result Date: 10/01/2019 CLINICAL DATA:  85 year old male with known right rib fractures from earlier fall. Concern for aspiration. EXAM: PORTABLE CHEST 1 VIEW COMPARISON:  Chest radiograph dated 09/23/2019 FINDINGS: Elevation of the left hemidiaphragm with left lung base atelectasis similar to prior radiograph. An area of apparent increased density in the right suprahilar region likely related to patient rotation and projection of mediastinal structure. Developing infiltrate is favored less likely. Clinical correlation is recommended. No large pleural effusion. There is no pneumothorax. Stable cardiomediastinal silhouette. Atherosclerotic calcification of the aorta. No acute osseous pathology. IMPRESSION: No interval change. Electronically Signed   By: Anner Crete M.D.   On: 10/01/2019 00:51    ED COURSE and MDM  Nursing notes, initial and subsequent vitals signs, including pulse oximetry, reviewed and interpreted by myself.  Vitals:   10/01/19 0230 10/01/19 0330 10/01/19 0430 10/01/19 0530  BP: 92/68 100/67 96/60 104/61  Pulse: 87 89 98   Resp: 14 (!) 27 (!) 27 18  Temp:      TempSrc:      SpO2: 97%  93% 94%   Weight:      Height:       Medications  0.9 %  sodium chloride infusion (1,000 mLs Intravenous New Bag/Given 10/01/19 0041)  sodium chloride (PF) 0.9 % injection (has no administration in time range)  atorvastatin (LIPITOR) tablet 80 mg (has no administration in time range)  feeding supplement (ENSURE ENLIVE) (ENSURE ENLIVE) liquid 237 mL (has  no administration in time range)  glycopyrrolate (ROBINUL) tablet 1 mg (has no administration in time range)  guaiFENesin (MUCINEX) 12 hr tablet 600 mg (has no administration in time range)  methocarbamol (ROBAXIN) tablet 500 mg (has no administration in time range)  multivitamin with minerals tablet 1 tablet (has no administration in time range)  polyethylene glycol (MIRALAX / GLYCOLAX) packet 17 g (has no administration in time range)  traMADol (ULTRAM) tablet 50 mg (has no administration in time range)  metoprolol succinate (TOPROL-XL) 24 hr tablet 25 mg (has no administration in time range)  morphine 2 MG/ML injection 2-4 mg (has no administration in time range)  acetaminophen (TYLENOL) tablet 650 mg (has no administration in time range)    Or  acetaminophen (TYLENOL) suppository 650 mg (has no administration in time range)  ondansetron (ZOFRAN) tablet 4 mg (has no administration in time range)    Or  ondansetron (ZOFRAN) injection 4 mg (has no administration in time range)  iohexol (OMNIPAQUE) 300 MG/ML solution 80 mL (80 mLs Intravenous Contrast Given 10/01/19 0132)  cefTRIAXone (ROCEPHIN) 1 g in sodium chloride 0.9 % 100 mL IVPB (0 g Intravenous Stopped 10/01/19 0343)  vancomycin (VANCOCIN) IVPB 1000 mg/200 mL premix (0 mg Intravenous Stopped 10/01/19 0452)  sodium chloride 0.9 % bolus 1,000 mL (0 mLs Intravenous Stopped 10/01/19 0559)   2:32 AM I was just informed that the patient's blood pressure, which is 191/140 on arrival, has now been running with a systolic in the [id number] and 0000000.  Given fever, hypotension and recent rib injuries he is at risk for secondary pneumonia.  A fluid bolus has been initiated and patient was started on Rocephin, vancomycin and Zithromax IV after discussion with Dr. Alcario Drought of the hospitalist service.    PROCEDURES  Procedures  CRITICAL CARE Performed by: Karen Chafe Maximiano Lott Total critical care time: 30 minutes Critical care time was exclusive of separately  billable procedures and treating other patients. Critical care was necessary to treat or prevent imminent or life-threatening deterioration. Critical care was time spent personally by me on the following activities: development of treatment plan with patient and/or surrogate as well as nursing, discussions with consultants, evaluation of patient's response to treatment, examination of patient, obtaining history from patient or surrogate, ordering and performing treatments and interventions, ordering and review of laboratory studies, ordering and review of radiographic studies, pulse oximetry and re-evaluation of patient's condition.   ED DIAGNOSES     ICD-10-CM   1. SIRS (systemic inflammatory response syndrome) (HCC)  R65.10   2. Fall from bed, sequela  W06.XXXS   3. Closed fracture of multiple ribs of left side, sequela  S22.42XS   4. Rupture of kidney, left, sequela  S37.062S        Shanon Rosser, MD 10/01/19 [phone]

## 2019-10-01 NOTE — H&P (Signed)
History and Physical    [name] [name] [id number] DOB: 1923-04-12 DOA: 09/30/2019  PCP: [name], MD  Patient coming from: SNF  I have personally briefly reviewed patient's old medical records in [place]  Chief Complaint: Abdominal pain  HPI: [name] [name] is a 85 y.o. male with medical history significant of A.Fib off of anticoags following trauma admit this month, HTN.  Patient with recent admission from 10/14-10/27 after falling out of bed in the SNF.  3 rib fx and a L kidney laceration.  Embolization performed on L kidney.  Patient presents to the ED this evening with c/o abd pain.  Says onset yesterday.  Diffuse.  Wondering if its just "gas" pain.  EMS reports fever at SNF yesterday.  SNF staff concerned about possible aspiration yesterday.   ED Course: Patient denies SOB though he is tachypnic here in the ED.  Temp is 99.4.  WBC 12k.  Satting 95% on RA.  Creat 1.44 was 1.3 on discharge.  Lactate nl, procalcitonin nl.  CT abd pelvis shows stable L perirenal hematoma, no active bleed, mass effect on L kidney with decreased perfusion.  Also shows fractures of L 9th-11th ribs, bilateral small pleural effusions L>R.  CXR neg.  COVID, UA, BCx are pending.  BP in ED is running low (123XX123 systolic), EDP ordered ABx and requests admit for SIRS / ? Sepsis.   Review of Systems: As per HPI, otherwise all review of systems negative.  Past Medical History:  Diagnosis Date   Arthritis    Atrial fibrillation ([place])    Cancer ([place])    colon ca - surgery, no chemo or radiation   Hypertension    Right shoulder pain 09/30/12   recently    Past Surgical History:  Procedure Laterality Date   CATARACT EXTRACTION W/ INTRAOCULAR LENS IMPLANT     b/l   colon surgery for colon cancer  approx. 2006   IR EMBO ART  VEN HEMORR LYMPH EXTRAV  INC GUIDE ROADMAPPING  09/15/2019   IR FLUORO GUIDE CV LINE RIGHT  09/15/2019   IR RENAL SUPRASEL UNI S&I MOD SED   09/15/2019   IR US GUIDE VASC ACCESS RIGHT  09/15/2019   IR US GUIDE VASC ACCESS RIGHT  09/15/2019     reports that he quit smoking about 30 years ago. His smoking use included cigarettes. He quit after 35.00 years of use. He has never used smokeless tobacco. He reports that he does not drink alcohol or use drugs.  Allergies  Allergen Reactions   Penicillins Other (See Comments)    Pt reports that he has tolerated oral PCN before but reports that his arm swelled where he received a PCN shot. Did it involve swelling of the face/tongue/throat, SOB, or low BP? Unknown Did it involve sudden or severe rash/hives, skin peeling, or any reaction on the inside of your mouth or nose? Unknown Did you need to seek medical attention at a hospital or doctor's office? Unknown When did it last happen? unk If all above answers are NO, may proceed with cephalosporin use.     Family History  Problem Relation Age of Onset   Heart attack Father      Prior to Admission medications   Medication Sig Start Date End Date Taking? Authorizing Provider  acetaminophen (TYLENOL) 325 MG tablet Take 2 tablets (650 mg total) by mouth every 6 (six) hours. 09/28/19   Rayburn, Floyce Stakes, PA-C  atorvastatin (LIPITOR) 80 MG tablet Take 80 mg  by mouth daily. 07/28/19   [provider]  B Complex-C (B-COMPLEX WITH VITAMIN C) tablet Take 1 tablet by mouth daily.    [provider]  Bioflavonoid Products (ESTER C PO) Take 1 tablet by mouth daily.    [provider]  docusate sodium (COLACE) 100 MG capsule Take 1 capsule (100 mg total) by mouth 2 (two) times daily. 09/28/19   Rayburn, Floyce Stakes, PA-C  feeding supplement, ENSURE ENLIVE, (ENSURE ENLIVE) LIQD Take 237 mLs by mouth 2 (two) times daily between meals. 09/28/19   Rayburn, Floyce Stakes, PA-C  glycopyrrolate (ROBINUL) 1 MG tablet Take 1 tablet (1 mg total) by mouth 2 (two) times daily. 09/28/19   Rayburn, Floyce Stakes, PA-C  guaiFENesin  (MUCINEX) 600 MG 12 hr tablet Take 1 tablet (600 mg total) by mouth 2 (two) times daily as needed for to loosen phlegm. 09/28/19   Rayburn, Floyce Stakes, PA-C  losartan-hydrochlorothiazide (HYZAAR) 100-25 MG per tablet Take 1 tablet by mouth daily.     [provider]  methocarbamol (ROBAXIN) 500 MG tablet Take 1 tablet (500 mg total) by mouth every 8 (eight) hours as needed for muscle spasms. 09/28/19   Rayburn, Floyce Stakes, PA-C  metoprolol succinate (TOPROL-XL) 50 MG 24 hr tablet Take 1 tablet (50 mg total) by mouth daily. Take with or immediately following a meal. Patient taking differently: Take 25 mg by mouth daily. Take with or immediately following a meal. 10/01/12   Bynum Bellows, MD  Multiple Vitamin (MULTIVITAMIN WITH MINERALS) TABS tablet Take 1 tablet by mouth daily.    [provider]  Multiple Vitamins-Minerals (PRESERVISION AREDS 2 PO) Take 1 tablet by mouth daily.    [provider]  polyethylene glycol (MIRALAX / GLYCOLAX) 17 g packet Take 17 g by mouth daily. 09/28/19   Rayburn, Floyce Stakes, PA-C  traMADol (ULTRAM) 50 MG tablet Take 1 tablet (50 mg total) by mouth every 6 (six) hours as needed for moderate pain. 09/28/19   Vivia Ewing, PA-C    Physical Exam: Vitals:   09/30/19 2357 10/01/19 0030 10/01/19 0100 10/01/19 0230  BP: (!) 191/140 (!) 87/53 (!) 83/55 92/68  Pulse: (!) 104 (!) 104 91 87  Resp: (!) 24 (!) 27 (!) 23 14  Temp: 99.4 F (37.4 C)     TempSrc: Oral     SpO2: 95% 99% (!) 86% 97%  Weight: 112.9 kg     Height: 6' (1.829 m)       Constitutional: NAD, calm, comfortable Eyes: PERRL, lids and conjunctivae normal ENMT: Mucous membranes are moist. Posterior pharynx clear of any exudate or lesions.Normal dentition.  Neck: normal, supple, no masses, no thyromegaly Respiratory: Appears mildly dyspnic with mild tachypnea Cardiovascular: Regular rate and rhythm, no murmurs / rubs / gallops. No extremity edema. 2+ pedal pulses. No carotid  bruits.  Abdomen: no tenderness, no masses palpated. No hepatosplenomegaly. Bowel sounds positive.  Musculoskeletal: no clubbing / cyanosis. No joint deformity upper and lower extremities. Good ROM, no contractures. Normal muscle tone.  Skin: no rashes, lesions, ulcers. No induration Neurologic: CN 2-12 grossly intact. Sensation intact, DTR normal. Strength 5/5 in all 4.  Psychiatric: Normal judgment and insight. Alert and oriented x 3. Normal mood.    Labs on Admission: I have personally reviewed following labs and imaging studies  CBC: Recent Labs  Lab 09/26/19 0226 10/01/19 0011  WBC 9.8 12.3*  NEUTROABS  --  9.1*  HGB 10.8* 11.0*  HCT 32.7* 34.6*  MCV 95.9 100.3*  PLT 340 [id number]   Basic Metabolic Panel: Recent Labs  Lab 09/25/19 0217 09/26/19 0226 09/27/19 0216 10/01/19 0011  NA 137 136 136 132*  K 3.9 3.3* 3.5 4.2  CL 102 100 100 97*  CO2 26 25 26 26   GLUCOSE 125* 126* 114* 114*  BUN 39* 43* 40* 46*  CREATININE 1.55* 1.39* 1.29* 1.44*  CALCIUM 8.3* 8.1* 8.2* 8.2*  MG 2.0  --   --   --   PHOS 3.0  --   --   --    GFR: Estimated Creatinine Clearance: 40.7 mL/min (A) (by C-G formula based on SCr of 1.44 mg/dL (H)). Liver Function Tests: Recent Labs  Lab 10/01/19 0011  AST 49*  ALT 32  ALKPHOS 76  BILITOT 1.6*  PROT 5.8*  ALBUMIN 2.4*   No results for input(s): LIPASE, AMYLASE in the last 168 hours. No results for input(s): AMMONIA in the last 168 hours. Coagulation Profile: Recent Labs  Lab 10/01/19 0011  INR 1.1   Cardiac Enzymes: No results for input(s): CKTOTAL, CKMB, CKMBINDEX, TROPONINI in the last 168 hours. BNP (last 3 results) No results for input(s): PROBNP in the last 8760 hours. HbA1C: No results for input(s): HGBA1C in the last 72 hours. CBG: No results for input(s): GLUCAP in the last 168 hours. Lipid Profile: No results for input(s): CHOL, HDL, LDLCALC, TRIG, CHOLHDL, LDLDIRECT in the last 72 hours. Thyroid Function Tests: No  results for input(s): TSH, T4TOTAL, FREET4, T3FREE, THYROIDAB in the last 72 hours. Anemia Panel: No results for input(s): VITAMINB12, FOLATE, FERRITIN, TIBC, IRON, RETICCTPCT in the last 72 hours. Urine analysis:    Component Value Date/Time   COLORURINE RED (A) 09/15/2019 1021   APPEARANCEUR TURBID (A) 09/15/2019 1021   LABSPEC 1.010 09/15/2019 1021   PHURINE 7.0 09/15/2019 1021   GLUCOSEU NEGATIVE 09/15/2019 1021   HGBUR LARGE (A) 09/15/2019 1021   BILIRUBINUR NEGATIVE 09/15/2019 1021   KETONESUR 15 (A) 09/15/2019 1021   PROTEINUR 100 (A) 09/15/2019 1021   NITRITE POSITIVE (A) 09/15/2019 1021   LEUKOCYTESUR TRACE (A) 09/15/2019 1021    Radiological Exams on Admission: Ct Abdomen Pelvis W Contrast  Result Date: 10/01/2019 CLINICAL DATA:  Abdominal pain, history of recent renal injury on the left initial encounter EXAM: CT ABDOMEN AND PELVIS WITH CONTRAST TECHNIQUE: Multidetector CT imaging of the abdomen and pelvis was performed using the standard protocol following bolus administration of intravenous contrast. CONTRAST:  48mL OMNIPAQUE IOHEXOL 300 MG/ML  SOLN COMPARISON:  09/15/2019 FINDINGS: Lower chest: Small left pleural effusion is noted with associated atelectatic changes slightly larger than that seen on the prior exam. Small right-sided pleural effusion is noted with atelectatic changes as well. Hepatobiliary: Multiple gallstones are noted within the gallbladder. No wall thickening or pericholecystic fluid is noted. The liver is within normal limits and stable. Pancreas: Unremarkable. No pancreatic ductal dilatation or surrounding inflammatory changes. Spleen: Normal in size without focal abnormality. Adrenals/Urinary Tract: Adrenal glands are within normal limits. Right kidney demonstrates a normal enhancement pattern with cystic change stable in appearance from the prior exam. No renal calculi or obstructive changes are noted. The bladder is partially distended. The left kidney  again demonstrates subcapsular hematoma. This measures approximately 8.7 x 3.4 cm in dimension with mixed attenuation consistent with a more subacute process. No acute hemorrhage is seen. Mass effect upon the left kidney is noted with some decreased enhancement seen in the mid to lower pole posteriorly stable from the prior  study. Changes of prior renal artery embolization are noted. Perinephric stranding on the left is seen. Area of previous hemorrhage along the left psoas muscle is noted. This is stable from the prior exam. Stomach/Bowel: Mild retained fecal material is noted within the colon. No obstructive changes are seen. The appendix is not well visualized. No inflammatory changes are seen. There are multiple loops of small bowel identified within an anterior abdominal wall hernia stable from the prior exam. No obstructive changes are seen. The stomach is within normal limits. Vascular/Lymphatic: Aortic atherosclerosis. 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 No obstructive changes are seen. No enlarged abdominal or pelvic lymph nodes. Reproductive: Prostate is unremarkable. Other: No abdominal wall hernia or abnormality. No abdominopelvic ascites. Musculoskeletal: Degenerative changes of lumbar spine are noted. Fractures of the posterolateral aspects of the left ninth, tenth and eleventh ribs are seen better visualized on than on the prior exam. IMPRESSION: Changes consistent with the known prior left perirenal hematoma. Mass effect on the left kidney is noted with decreased perfusion inferiorly which may represent changes of [place] kidney. No findings to suggest active extravasation are noted. Prior embolization is seen. Stable dilatation of the infrarenal aorta. Anterior abdominal wall hernia with multiple loops of small bowel are noted within no obstructive changes are noted. Fractures of the left ninth through eleventh ribs posteriorly. Cholelithiasis. Bilateral pleural effusions left  greater than right. Electronically Signed   By: Inez Catalina M.D.   On: 10/01/2019 02:04   Dg Chest Port 1 View  Result Date: 10/01/2019 CLINICAL DATA:  85 year old male with known right rib fractures from earlier fall. Concern for aspiration. EXAM: PORTABLE CHEST 1 VIEW COMPARISON:  Chest radiograph dated 09/23/2019 FINDINGS: Elevation of the left hemidiaphragm with left lung base atelectasis similar to prior radiograph. An area of apparent increased density in the right suprahilar region likely related to patient rotation and projection of mediastinal structure. Developing infiltrate is favored less likely. Clinical correlation is recommended. No large pleural effusion. There is no pneumothorax. Stable cardiomediastinal silhouette. Atherosclerotic calcification of the aorta. No acute osseous pathology. IMPRESSION: No interval change. Electronically Signed   By: Anner Crete M.D.   On: 10/01/2019 00:51    EKG: Independently reviewed.  Assessment/Plan Principal Problem:   SIRS (systemic inflammatory response syndrome) (HCC) Active Problems:   HTN (hypertension)   A-fib (HCC)   Kidney laceration, left   Abdominal pain   Multiple rib fractures    1. SIRS - 1. Low grade temp of 99.4, wbc 12.3K, BPs running in the 80s-90s in ED. 2. EDP initiated sepsis pathway 3. Patient getting empiric doses of rocephin / vanc 4. Lactate NL, procalcitonin negative, no apparent distress 1. Will just do a modified, 1L NS bolus for the moment and 125 cc/hr NS 5. No clear source of infection 1. Will hold off on ordering further ABx for the moment and see how he does clinically. 6. Daily procalcitonin levels 7. Repeat CBC/BMP in AM 8. CXR neg 9. UA pending 10. BCx pending 2. Concern for aspiration at SNF - 1. SLP eval and diet order 2. Lung bases at least are clear on CT scan other than the B small pleural effusions. 3. A.Fib - rate controlled 1. Continue metoprolol 2. Not on anticoagulation  following the trauma earlier this month 3. Tele monitor 4. L kidney laceration - 1. S/p embolization 2. No active extravasation of contrast on todays CT abd/pelvis 5. abd pain - 1. Possibly due to  sequella of kidney laceration / reduced perfusion to the L kidney due to mass effect from the hematoma? 2. Will do morphine PRN pain 3. No other emergent pathology noted on CT abd/pelvis. 6. H/O HTN - 1. Hypotensive today 2. Holding Hyzaar 3. IVF as above  DVT prophylaxis: SCDs Code Status: DNR Family Communication: No family in room Disposition Plan: SNF after admit Consults called: None Admission status: Place in 94    Paisleigh Maroney, [place] [facility]  How to contact the [facility] Attending or Consulting provider [place] or covering provider during after hours [place], for this patient?  1. Check the care team in [facility] and look for a) attending/consulting [facility] provider listed and b) the [facility] team listed 2. Log into [email or web address]  Amion Physician Scheduling and messaging for groups and whole hospitals  On call and physician scheduling software for group practices, residents, hospitalists and other medical providers for call, clinic, rotation and shift schedules. OnCall [employer] is a hospital-wide system for scheduling doctors and paging doctors on call. EasyPlot is for scientific plotting and data analysis.  [email or web address]  and use [place]'s universal password to access. If you do not have the password, please contact the hospital operator.  3. Locate the [facility] provider you are looking for under [facility] and page to a number that you can be directly reached. 4. If you still have difficulty reaching the provider, please page the [facility] (Director on Call) for the Hospitalists listed on amion for assistance.  10/01/2019, 3:10 AM

## 2019-10-01 NOTE — Progress Notes (Signed)
Patient was seen and examined.  Remains in emergency room waiting for inpatient bed assignment.  Admitted early morning hours by nighttime hospitalist.  85 year old gentleman with recent extensive hospitalization with trauma, left kidney laceration, left-sided rib fractures, chronic aspiration who was brought to the hospital with concern about abdominal pain.  Repeat scanning including CAT scan of the abdomen pelvis does not show any evidence of complication.  Initial blood pressure was less than 90, resuscitated with IV fluids and given first dose of broad-spectrum antibiotics.  Procalcitonin normal.  No evidence of bacterial infection on clinical evaluation.  Plan: Due to significant symptoms on presentation and recent extensive hospitalization, agree to monitor in the hospital next 24 hours before discharging back to skilled nursing facility.  We will hold off on antibiotics and monitor clinically.  Urine was normal.  Chest x-ray with no evidence of infection.  COVID-19 was negative.  Intra-abdominal exam is benign.  His abdominal pain has improved.  Patient with overall deconditioning and declining status.  Chronic aspiration, desires to eat food.  DNR on last admission, was full code on nursing home records. Patient stated to me that he wants to be full code, he wants to be alive as much as he can be.  We discussed in detail about his progressive debility and repeat hospitalizations.  "I am 89, natural to be weak and dying in near future, I cannot hope to be all right and become younger". He has slight inconsistency in expressing his goal of care and CODE STATUS.  Called and discussed with patient's son who lives in [place].  He referred me to local friend, Mr. Iran Planas who has been monitoring patient's progress and patient and his son has requested him to be a local guardian.  I discussed with Mr. Sabra Heck and updated him.  They all agree that he is slowly declining.  Mr. Sabra Heck is going to visit  patient in the hospital, he also has electronic copy of living will that he will bring to the hospital. Patient is currently fairly stable.  He will be monitored in the hospital overnight.  We will consult palliative care to help coordinate care, help establish goals of care.  He may even benefit with hospice at nursing home.  Addendum: His lab test in the morning are erroneous areas and.  Suggested repeating and they are fairly stable.  We will recheck tomorrow morning.

## 2019-10-01 NOTE — ED Notes (Signed)
Critical Value-potassium-2.6   Critical Value- Calcium-5.4

## 2019-10-01 NOTE — ED Notes (Signed)
Patient given meal tray. This RN is assisting patient with feeding.

## 2019-10-01 NOTE — ED Notes (Signed)
Living will and POA paperwork given to this RN by [name] and placed in medical chart records folder to be scanned into charting.

## 2019-10-01 NOTE — ED Notes (Signed)
Per Dante Gang, MD redraw bmp and cbc.

## 2019-10-01 NOTE — ED Notes (Signed)
Pt requesting oxygen  States he would feel better if he had some on  Oxygen applied at 2 liters/min via [place] per pt request

## 2019-10-01 NOTE — ED Notes (Signed)
Patient had medium soft light brown BM. Patient urinated 350 mLs.

## 2019-10-01 NOTE — ED Notes (Signed)
Dr. Gardner at bedside 

## 2019-10-01 NOTE — Progress Notes (Addendum)
SLP Consult Note  Patient Details Name: [name] [name] MRN: [id number] DOB: Jun 23, 1923    Orders received for BSE. Pt currently in ED with abdominal pain.  Of note, pt underwent MBS 09/20/2019, with the following documentation: "concern for aspiration across meals regardless of textures, which is likely not acute given what appears to be a primary esophageal source, but he is more likely to develop complications in his deconditioned state. Pt says that he has had his esophagus stretched in the past. Could consider GI involvement. Pt may be appropriate for comfort feeds, accepting the risk of aspiration but trying to reduce it as much as possible with small, single bites/sips of soft purees and thin liquids (although of course if pt is fully accepting of aspiration risk, he could try more solid foods as well). Thorough and frequent oral care would be important. Would use aspiration and esophageal precautions and provide assistance with careful hand feeding. Per PA, she and/or MD will discuss with pt further before making any decisions".  SLP follow up 09/21/2019 indicated pt had "decided to continue with Dys 2 diet and thin liquids with known aspiration risk", and that given his age, "he would rather not pursue alternatives such as a feeding tube or other interventions."  Ordering MD was informed of recent SLP evaluation results and recommendations. Requested clarification of whether or not repeat swallow evaluation was necessary at this time, as significant improvement in swallow function/safety is unlikely. Awaiting direction from MD.    Early Chars B. Quentin Ore, [facility], [place] Speech Language Pathologist Office: [phone] Pager: [phone]  Shonna Chock 10/01/2019, 9:37 AM

## 2019-10-01 NOTE — ED Notes (Addendum)
Patient called out to use bed pan and urinal. Patient placed on bed pan and given urinal.

## 2019-10-02 DIAGNOSIS — R6510 Systemic inflammatory response syndrome (SIRS) of non-infectious origin without acute organ dysfunction: Secondary | ICD-10-CM | POA: Diagnosis not present

## 2019-10-02 LAB — CBC WITH DIFFERENTIAL/PLATELET
Abs Immature Granulocytes: 0.06 10*3/uL (ref 0.00–0.07)
Basophils Absolute: 0.1 10*3/uL (ref 0.0–0.1)
Basophils Relative: 1 %
Eosinophils Absolute: 0.2 10*3/uL (ref 0.0–0.5)
Eosinophils Relative: 2 %
HCT: 37 % — ABNORMAL LOW (ref 39.0–52.0)
Hemoglobin: 11.4 g/dL — ABNORMAL LOW (ref 13.0–17.0)
Immature Granulocytes: 1 %
Lymphocytes Relative: 11 %
Lymphs Abs: 1.1 10*3/uL (ref 0.7–4.0)
MCH: 31.8 pg (ref 26.0–34.0)
MCHC: 30.8 g/dL (ref 30.0–36.0)
MCV: 103.1 fL — ABNORMAL HIGH (ref 80.0–100.0)
Monocytes Absolute: 1 10*3/uL (ref 0.1–1.0)
Monocytes Relative: 10 %
Neutro Abs: 7.6 10*3/uL (ref 1.7–7.7)
Neutrophils Relative %: 75 %
Platelets: 369 10*3/uL (ref 150–400)
RBC: 3.59 MIL/uL — ABNORMAL LOW (ref 4.22–5.81)
RDW: 15.6 % — ABNORMAL HIGH (ref 11.5–15.5)
WBC: 10.1 10*3/uL (ref 4.0–10.5)
nRBC: 0 % (ref 0.0–0.2)

## 2019-10-02 LAB — BASIC METABOLIC PANEL
Anion gap: 9 (ref 5–15)
BUN: 44 mg/dL — ABNORMAL HIGH (ref 8–23)
CO2: 25 mmol/L (ref 22–32)
Calcium: 8.5 mg/dL — ABNORMAL LOW (ref 8.9–10.3)
Chloride: 101 mmol/L (ref 98–111)
Creatinine, Ser: 1.23 mg/dL (ref 0.61–1.24)
GFR calc Af Amer: 58 mL/min — ABNORMAL LOW (ref >60)
GFR calc non Af Amer: 50 mL/min — ABNORMAL LOW (ref >60)
Glucose, Bld: 120 mg/dL — ABNORMAL HIGH (ref 70–99)
Potassium: 4.2 mmol/L (ref 3.5–5.1)
Sodium: 135 mmol/L (ref 135–145)

## 2019-10-02 LAB — URINE CULTURE: Culture: <10000 — AB

## 2019-10-02 LAB — MAGNESIUM: Magnesium: 2.1 mg/dL (ref 1.7–2.4)

## 2019-10-02 NOTE — Progress Notes (Signed)
PT Cancellation Note  Patient Details Name: [name] [name] MRN: [id number] DOB: 03-06-1923   Cancelled Treatment:    Reason Eval/Treat Not Completed: Other (comment). Pt from SNF and has d/c order to return to SNF today.  If for some reason d/c is canceled will check on pt tomorrow.   Galen Manila 10/02/2019, 3:33 PM

## 2019-10-02 NOTE — Progress Notes (Signed)
OT Cancellation Note  Patient Details Name: [name] [name] MRN: [id number] DOB: 1923/07/23   Cancelled Treatment:    Reason Eval/Treat Not Completed: Other (comment); spoke with RN and noted per chart pt from SNF and to return to SNF this afternoon. Should pt not discharge will follow up for OT eval as able.  Lou Cal, OT [facility] Pager [phone] Office [phone]    Raymondo Band 10/02/2019, 3:57 PM

## 2019-10-02 NOTE — Progress Notes (Addendum)
Social work states she was unable to get in touch with facility to get patient transferred.  MD, charge nurse and patients friend [name] made aware.

## 2019-10-02 NOTE — Consult Note (Signed)
Consultation Note Date: 10/02/2019   Patient Name: [name] [name]  DOB: Jan 30, 1923  MRN: [id number]  Age / Sex: 85 y.o., male  PCP: [name], MD Referring Physician: Barb Merino, MD  Reason for Consultation: Establishing goals of care  HPI/Patient Profile: 85 y.o. male   admitted on 09/30/2019   85 year old gentleman with a past medical history significant for atrial fibrillation, chronic aspiration, hypertension, dyslipidemia, history of colon cancer status post colectomy.  Patient was recently hospitalized after a fall.  He was found to have rib fractures and laceration of the left kidney and acute blood loss anemia.  He underwent an extensive hospitalization recently where his laceration was repaired.  Ultimately he was transition to skilled nursing facility in a rehabilitation attempt.  Patient comes into the hospital with abdominal pain.  There are concerns that he has been having gradual functional status decline.  He is on dysphagia 2 nectar thick diet as recommended by speech therapy.  A palliative consult has been requested for goals of care discussions.    Clinical Assessment and Goals of Care: Patient is an elderly gentleman sitting up in bed.  He is awake alert oriented.  He is a little hard of hearing but corresponds appropriately and answers all questions appropriately and is able to talk freely and express his wishes and goals.  Patient states that he recently fell again at his skilled nursing facility, he thinks he hurt his back or shoulder area.  Patient states that he has been told he might be going back to his rehab facility today.  He is looking forward to being back there to continue to work on physical therapy.  He hopes to get well enough to be able to go back home.  Brief life review performed.  Patient worked in [employer] data [employer].  For period of time, he lived in  [place] and worked for [employer].  He had 2 sons but 1 son died.  His other son lives in [place], [place].  His wife has died.  He states that he has a lot of faith in God and his church and his church community is very important to him.  He used to be able to read a big print Bible but because of worsening macular degeneration, he is not able to read anymore.  Please note additional discussion/recommendations as listed below.  Thank you for the consult.  NEXT OF KIN  has a son who lives in [place], [place] Has several friends and church members who check up on him daily.   SUMMARY OF RECOMMENDATIONS    ok for D/C back to SNF rehab, would recommend palliative care follow the patient over there. Patient is completely alert and oriented, though a little hard of hearing, remains invested in improving his strength and his ultimate goal is to go back home, he has support from friends and church community. Full code for now, as per his wishes.  Thank you for the consult.   Code Status/Advance Care Planning:  Full code  Symptom Management:    as above   Palliative Prophylaxis:   Delirium Protocol   Psycho-social/Spiritual:   Desire for further Chaplaincy support:no  Additional Recommendations: Caregiving  Support/Resources  Prognosis:   Unable to determine  Discharge Planning: [facility] for rehab with Palliative care service follow-up      Primary Diagnoses: Present on Admission:  A-fib ([place])  Abdominal pain  Kidney laceration, left  Multiple rib fractures  HTN (hypertension)  SIRS (systemic inflammatory response syndrome) ([facility])  CKD (chronic kidney disease) stage 3, GFR 30-59 ml/min   I have reviewed the medical record, interviewed the patient and family, and examined the patient. The following aspects are pertinent.  Past Medical History:  Diagnosis Date   Arthritis    Atrial fibrillation ([place])    Cancer ([place])    colon ca - surgery, no  chemo or radiation   Hypertension    Right shoulder pain 09/30/12   recently   Social History   Socioeconomic History   Marital status: Widowed    Spouse name: Not on file   Number of children: Not on file   Years of education: Not on file   Highest education level: Not on file  Occupational History   Not on file  Social Needs   Financial resource strain: Not on file   Food insecurity    Worry: Not on file    Inability: Not on file   Transportation needs    Medical: Not on file    Non-medical: Not on file  Tobacco Use   Smoking status: Former Smoker    Years: 35.00    Types: Cigarettes    Quit date: 09/30/1989    Years since quitting: 30.0   Smokeless tobacco: Never Used  Substance and Sexual Activity   Alcohol use: No    Comment: quit drinking about 27 years ago   Drug use: No   Sexual activity: Not on file  Lifestyle   Physical activity    Days per week: Not on file    Minutes per session: Not on file   Stress: Not on file  Relationships   Social connections    Talks on phone: Not on file    Gets together: Not on file    Attends religious service: Not on file    Active member of club or organization: Not on file    Attends meetings of clubs or organizations: Not on file    Relationship status: Not on file  Other Topics Concern   Not on file  Social History Narrative   Not on file   Family History  Problem Relation Age of Onset   Heart attack Father    Scheduled Meds:  atorvastatin  80 mg Oral Daily   feeding supplement (ENSURE ENLIVE)  237 mL Oral BID BM   glycopyrrolate  1 mg Oral BID   metoprolol succinate  25 mg Oral Daily   multivitamin with minerals  1 tablet Oral Daily   polyethylene glycol  17 g Oral Daily   potassium chloride  20 mEq Oral BID   Continuous Infusions: PRN Meds:.acetaminophen **OR** acetaminophen, guaiFENesin, methocarbamol, morphine injection, ondansetron **OR** ondansetron (ZOFRAN) IV,  traMADol Medications Prior to Admission:  Prior to Admission medications   Medication Sig Start Date End Date Taking? Authorizing Provider  acetaminophen (TYLENOL) 325 MG tablet Take 2 tablets (650 mg total) by mouth every 6 (six) hours. Patient taking differently: Take 650 mg by mouth every 6 (six) hours as needed  for moderate pain.  09/28/19  Yes Rayburn, Claiborne Billings A, PA-C  albuterol (PROVENTIL) (2.5 MG/3ML) 0.083% nebulizer solution Take 2.5 mg by nebulization every 6 (six) hours as needed for wheezing or shortness of breath.   Yes [provider]  atorvastatin (LIPITOR) 80 MG tablet Take 80 mg by mouth daily. 07/28/19  Yes [provider]  docusate sodium (COLACE) 100 MG capsule Take 1 capsule (100 mg total) by mouth 2 (two) times daily. 09/28/19  Yes Rayburn, Claiborne Billings A, PA-C  feeding supplement, ENSURE ENLIVE, (ENSURE ENLIVE) LIQD Take 237 mLs by mouth 2 (two) times daily between meals. 09/28/19  Yes Rayburn, Claiborne Billings A, PA-C  glycopyrrolate (ROBINUL) 1 MG tablet Take 1 tablet (1 mg total) by mouth 2 (two) times daily. 09/28/19  Yes Rayburn, Floyce Stakes, PA-C  guaiFENesin (MUCINEX) 600 MG 12 hr tablet Take 1 tablet (600 mg total) by mouth 2 (two) times daily as needed for to loosen phlegm. 09/28/19  Yes Rayburn, Floyce Stakes, PA-C  losartan-hydrochlorothiazide (HYZAAR) 100-25 MG per tablet Take 1 tablet by mouth daily.    Yes [provider]  methocarbamol (ROBAXIN) 500 MG tablet Take 1 tablet (500 mg total) by mouth every 8 (eight) hours as needed for muscle spasms. 09/28/19  Yes Rayburn, Claiborne Billings A, PA-C  metoprolol succinate (TOPROL-XL) 50 MG 24 hr tablet Take 1 tablet (50 mg total) by mouth daily. Take with or immediately following a meal. 10/01/12  Yes Bynum Bellows, MD  Multiple Vitamin (MULTIVITAMIN WITH MINERALS) TABS tablet Take 1 tablet by mouth daily.   Yes [provider]  polyethylene glycol (MIRALAX / GLYCOLAX) 17 g packet Take 17 g by mouth daily. 09/28/19  Yes  Rayburn, Floyce Stakes, PA-C  traMADol (ULTRAM) 50 MG tablet Take 1 tablet (50 mg total) by mouth every 6 (six) hours as needed for moderate pain. 09/28/19  Yes Rayburn, Claiborne Billings A, PA-C   Allergies  Allergen Reactions   Penicillins Other (See Comments)    Pt reports that he has tolerated oral PCN before but reports that his arm swelled where he received a PCN shot. Did it involve swelling of the face/tongue/throat, SOB, or low BP? Unknown Did it involve sudden or severe rash/hives, skin peeling, or any reaction on the inside of your mouth or nose? Unknown Did you need to seek medical attention at a hospital or doctor's office? Unknown When did it last happen? unk If all above answers are NO, may proceed with cephalosporin use.    Review of Systems Hard of hearing  Physical Exam Awake alert Hard of hearing No distress Regular S1 S2 Abdomen not tender Some edema Non focal  Vital Signs: BP 101/64 (BP Location: Left Arm)    Pulse 82    Temp (!) 97.5 F (36.4 C) (Oral)    Resp (!) 22    Ht 6' (1.829 m)    Wt 112.9 kg    SpO2 100%    BMI 33.77 kg/m  Pain Scale: PAINAD   Pain Score: Asleep   SpO2: SpO2: 100 % O2 Device:SpO2: 100 % O2 Flow Rate: .O2 Flow Rate (L/min): 2 L/min  IO: Intake/output summary:   Intake/Output Summary (Last 24 hours) at 10/02/2019 1014 Last data filed at 10/02/2019 0600 Gross per 24 hour  Intake 2094.31 ml  Output 350 ml  Net 1744.31 ml    LBM: Last BM Date: 10/01/19 Baseline Weight: Weight: 112.9 kg Most recent weight: Weight: 112.9 kg     Palliative Assessment/Data:   PPS  40%  Time In:  9 Time Out:  10 Time Total:  60  Greater than 50%  of this time was spent counseling and coordinating care related to the above assessment and plan.  Signed by: Loistine Chance, MD   Please contact Palliative Medicine Team phone at [phone] for questions and concerns.  For individual provider: See Shea Evans

## 2019-10-02 NOTE — Discharge Summary (Addendum)
Physician Discharge Summary  [name] [name] DJS:[id number] DOB: 06-04-1923 DOA: 09/30/2019  PCP: [name], MD  Admit date: 09/30/2019 Discharge date: 10/03/2019 Admitted From: Skilled nursing facility Disposition: Skilled nursing facility  Recommendations for Outpatient Follow-up:  1. Follow-up with provider at skilled nursing facility. 2. Consult palliative care provider while at a skilled rehab.  Home Health: Not applicable Equipment/Devices: Not applicable  Discharge Condition: Stable CODE STATUS: Full code Diet recommendation: Low-salt diet, dietary supplements and protein supplements. Dysphagia 2 diet with nectar thick liquid.  Discharge summary: Patient has a history of chronic A. fib not on anticoagulation on his request, chronic aspiration, hypertension, dyslipidemia history of colon cancer status post colectomy.  He had recent prolonged hospitalization at [facility] with fall, rib fractures and a splenic laceration that was treated with embolization.  Ultimately he was transferred to a skilled nursing facility for rehab attempt.  Patient complained of abdominal pain, concern about gradual decline and aspiration event so was sent back to the ER.  He was monitored in the hospital.  His plan of care is as following:  Abdominal pain: Patient with recent trauma and splenic laceration.  Underwent repeat CAT scan and found to be stable.  No complications.  He is in the hospital for more than 24 hours and has not needed any pain medications.  Resolved.  No indication to continue to use opiates.  Please use adequate stool softener and laxatives to avoid constipation.  Mobility will help.  SIRS: Sepsis ruled out. Patient presented with reported temperature of 99.4, WBC count of 12.3 and blood pressure of 90 systolic.  In the emergency room he was started on sepsis pathway, 1 L IV fluid bolus, Rocephin and vancomycin were given.  Extensive investigation did not reveal any evidence of  infection.  COVID-19 was negative.  Lactic acid was normal.  Procalcitonin was normal.  He was monitored off antibiotics after initial dose of antibiotics.  Final urine cultures and blood cultures no growth so far.  Probably related to dehydration and poor oral intake.  Sepsis ruled out.  Concern for aspiration: Patient definitely is high risk of aspiration.  Currently no evidence of aspiration pneumonia.  He was seen by speech therapy in the past, had a speech evaluation and modified barium swallow.  Patient aspirates on all consistencies.  Patient is not a good candidate for artificial feeding and he does not want to eat.  Patient however wants to eat as much he likes.  Patient will aspirate, so he will need supervised feeding and all dysphagia precautions.  He has agreed on dysphagia 2 diet with nectar thick liquid.  Hypertension: Presenting with low blood pressures and dehydration.  He will continue metoprolol for heart rate control, however will discontinue other antihypertensives.   Advance care planning: This was discussed with patient in details.  He does have a advance directives available.  He has a local friend, Mr. Iran Planas who acts as surrogate decision-maker for his son who lives out of state.  This was discussed in detail with patient by this provider and palliative care provider, patient wants to be full code.  Will request palliative consult at SNF to continue to follow-up.  Patient is a stabilized.  He does have chronic conditions.  Patient is stable to transfer to skilled level of care.  Discharge Diagnoses:  Principal Problem:   SIRS (systemic inflammatory response syndrome) (HCC) Active Problems:   HTN (hypertension)   A-fib (HCC)   Kidney laceration, left  Abdominal pain   Multiple rib fractures   CKD (chronic kidney disease) stage 3, GFR 30-59 ml/min    Discharge Instructions  Discharge Instructions    Call MD for:  difficulty breathing, headache or visual  disturbances   Complete by: As directed    Call MD for:  severe uncontrolled pain   Complete by: As directed    Diet - low sodium heart healthy   Complete by: As directed    Increase activity slowly   Complete by: As directed      Allergies as of 10/02/2019      Reactions   Penicillins Other (See Comments)   Pt reports that he has tolerated oral PCN before but reports that his arm swelled where he received a PCN shot. Did it involve swelling of the face/tongue/throat, SOB, or low BP? Unknown Did it involve sudden or severe rash/hives, skin peeling, or any reaction on the inside of your mouth or nose? Unknown Did you need to seek medical attention at a hospital or doctor's office? Unknown When did it last happen? unk If all above answers are "NO", may proceed with cephalosporin use.      Medication List    STOP taking these medications   losartan-hydrochlorothiazide 100-25 MG tablet Commonly known as: HYZAAR   traMADol 50 MG tablet Commonly known as: ULTRAM     TAKE these medications   acetaminophen 325 MG tablet Commonly known as: TYLENOL Take 2 tablets (650 mg total) by mouth every 6 (six) hours. What changed:   when to take this  reasons to take this   albuterol (2.5 MG/3ML) 0.083% nebulizer solution Commonly known as: PROVENTIL Take 2.5 mg by nebulization every 6 (six) hours as needed for wheezing or shortness of breath.   atorvastatin 80 MG tablet Commonly known as: LIPITOR Take 80 mg by mouth daily.   docusate sodium 100 MG capsule Commonly known as: COLACE Take 1 capsule (100 mg total) by mouth 2 (two) times daily.   feeding supplement (ENSURE ENLIVE) Liqd Take 237 mLs by mouth 2 (two) times daily between meals.   glycopyrrolate 1 MG tablet Commonly known as: ROBINUL Take 1 tablet (1 mg total) by mouth 2 (two) times daily.   guaiFENesin 600 MG 12 hr tablet Commonly known as: MUCINEX Take 1 tablet (600 mg total) by mouth 2 (two) times daily as  needed for to loosen phlegm.   methocarbamol 500 MG tablet Commonly known as: ROBAXIN Take 1 tablet (500 mg total) by mouth every 8 (eight) hours as needed for muscle spasms.   metoprolol succinate 50 MG 24 hr tablet Commonly known as: TOPROL-XL Take 1 tablet (50 mg total) by mouth daily. Take with or immediately following a meal.   multivitamin with minerals Tabs tablet Take 1 tablet by mouth daily.   polyethylene glycol 17 g packet Commonly known as: MIRALAX / GLYCOLAX Take 17 g by mouth daily.       Allergies  Allergen Reactions  . Penicillins Other (See Comments)    Pt reports that he has tolerated oral PCN before but reports that his arm swelled where he received a PCN shot. Did it involve swelling of the face/tongue/throat, SOB, or low BP? Unknown Did it involve sudden or severe rash/hives, skin peeling, or any reaction on the inside of your mouth or nose? Unknown Did you need to seek medical attention at a hospital or doctor's office? Unknown When did it last happen? unk If all above answers are "NO", may  proceed with cephalosporin use.     Consultations:  Palliative care team   Procedures/Studies: Ct Head Wo Contrast  Result Date: 09/15/2019 CLINICAL DATA:  Trauma, fall EXAM: CT HEAD WITHOUT CONTRAST CT CERVICAL SPINE WITHOUT CONTRAST TECHNIQUE: Multidetector CT imaging of the head and cervical spine was performed following the standard protocol without intravenous contrast. Multiplanar CT image reconstructions of the cervical spine were also generated. COMPARISON:  None. FINDINGS: CT HEAD FINDINGS Brain: No evidence of acute infarction, hemorrhage, hydrocephalus, extra-axial collection or mass lesion/mass effect. This periventricular and deep white matter hypodensity with global volume loss. Vascular: No hyperdense vessel or unexpected calcification. Skull: Normal. Negative for fracture or focal lesion. Sinuses/Orbits: No acute finding. Other: None. CT CERVICAL  SPINE FINDINGS Alignment: Degenerative straightening of the normal cervical lordosis. Skull base and vertebrae: No acute fracture. No primary bone lesion or focal pathologic process. Soft tissues and spinal canal: No prevertebral fluid or swelling. No visible canal hematoma. Disc levels: Severe multilevel disc degenerative disease and partial ankylosis of the cervical spine. Upper chest: Negative. Other: Aortic atherosclerosis. IMPRESSION: 1. No acute intracranial pathology. Small-vessel white matter disease and global volume loss in keeping with advanced patient age. 2. No fracture or static subluxation of the cervical spine. Severe multilevel disc degenerative disease. 3.  Aortic Atherosclerosis (ICD10-I70.0). Electronically Signed   By: Eddie Candle M.D.   On: 09/15/2019 12:20   Ct Chest W Contrast  Result Date: 09/15/2019 CLINICAL DATA:  Chest trauma, fall, pain in ribs and back, history of colon cancer EXAM: CT CHEST, ABDOMEN, AND PELVIS WITH CONTRAST TECHNIQUE: Multidetector CT imaging of the chest, abdomen and pelvis was performed following the standard protocol during bolus administration of intravenous contrast. CONTRAST:  55m OMNIPAQUE IOHEXOL 300 MG/ML  SOLN COMPARISON:  None. FINDINGS: CT CHEST FINDINGS Cardiovascular: Aortic atherosclerosis. Cardiomegaly. Three-vessel coronary artery calcifications. No pericardial effusion. Mediastinum/Nodes: No enlarged mediastinal, hilar, or axillary lymph nodes. Thyroid gland, trachea, and esophagus demonstrate no significant findings. Lungs/Pleura: Lungs are clear. Large left-sided diaphragmatic eventration. Trace left pleural effusion. Musculoskeletal: No chest wall mass or suspicious bone lesions identified. There are minimally displaced fractures of the lateral and posterior left seventh through eleventh ribs. CT ABDOMEN PELVIS FINDINGS Hepatobiliary: No solid liver abnormality is seen. Gallstones. No gallbladder wall thickening, or biliary dilatation.  Pancreas: Unremarkable. No pancreatic ductal dilatation or surrounding inflammatory changes. Spleen: Normal in size without significant abnormality. Adrenals/Urinary Tract: Adrenal glands are unremarkable. There is a laceration and large hematoma of the inferior pole of the left kidney with multiple internal foci of contrast attenuation concerning for active extravasation (series 3, image 63, 69). There is extensive fluid and contrast in the perirenal fascia and retroperitoneal soft tissues overlying the psoas. Bladder is unremarkable. Stomach/Bowel: Stomach is within normal limits. No evidence of bowel wall thickening, distention, or inflammatory changes. Evidence of prior sigmoid resection and reanastomosis Vascular/Lymphatic: Aortic atherosclerosis. Infrarenal abdominal aortic aneurysm measuring 4.2 x 4.1 cm. No enlarged abdominal or pelvic lymph nodes. Reproductive: No mass or other abnormality. Other: There is a midline ventral hernia containing nonobstructed loops of small bowel, hernia sac measuring 11.2 x 3.7 cm and neck measuring 4.8 cm (series 3, image 93, series 7, image 112). No abdominopelvic ascites. Musculoskeletal: No acute or significant osseous findings. IMPRESSION: 1. There is a laceration and large hematoma of the inferior pole of the left kidney with multiple internal foci of contrast attenuation concerning for active extravasation (series 3, image 63, 69). There is extensive fluid and contrast in  the perirenal fascia and retroperitoneal soft tissues overlying the psoas. 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 2. There are minimally displaced fractures of the overlying lateral and posterior left seventh through eleventh ribs. 3.  Trace left pleural effusion. 4. 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 5. There is a midline ventral hernia containing  nonobstructed loops of small bowel, hernia sac measuring 11.2 x 3.7 cm and neck measuring 4.8 cm (series 3, image 93, series 7, image 112). 6. Infrarenal abdominal aortic aneurysm measuring 4.2 x 4.1 cm. Aortic Atherosclerosis (ICD10-I70.0). 7.  Cardiomegaly and coronary artery disease. 8.  Evidence of prior sigmoid colon resection and reanastomosis. 9.  Cholelithiasis. These results were called by telephone at the time of interpretation on 09/15/2019 at 12:44 pm to provider Veryl Speak , who verbally acknowledged these results. Electronically Signed   By: Eddie Candle M.D.   On: 09/15/2019 12:49   Ct Cervical Spine Wo Contrast  Result Date: 09/15/2019 CLINICAL DATA:  Trauma, fall EXAM: CT HEAD WITHOUT CONTRAST CT CERVICAL SPINE WITHOUT CONTRAST TECHNIQUE: Multidetector CT imaging of the head and cervical spine was performed following the standard protocol without intravenous contrast. Multiplanar CT image reconstructions of the cervical spine were also generated. COMPARISON:  None. FINDINGS: CT HEAD FINDINGS Brain: No evidence of acute infarction, hemorrhage, hydrocephalus, extra-axial collection or mass lesion/mass effect. This periventricular and deep white matter hypodensity with global volume loss. Vascular: No hyperdense vessel or unexpected calcification. Skull: Normal. Negative for fracture or focal lesion. Sinuses/Orbits: No acute finding. Other: None. CT CERVICAL SPINE FINDINGS Alignment: Degenerative straightening of the normal cervical lordosis. Skull base and vertebrae: No acute fracture. No primary bone lesion or focal pathologic process. Soft tissues and spinal canal: No prevertebral fluid or swelling. No visible canal hematoma. Disc levels: Severe multilevel disc degenerative disease and partial ankylosis of the cervical spine. Upper chest: Negative. Other: Aortic atherosclerosis. IMPRESSION: 1. No acute intracranial pathology. Small-vessel white matter disease and global volume loss in keeping  with advanced patient age. 2. No fracture or static subluxation of the cervical spine. Severe multilevel disc degenerative disease. 3.  Aortic Atherosclerosis (ICD10-I70.0). Electronically Signed   By: Eddie Candle M.D.   On: 09/15/2019 12:20   Ct Abdomen Pelvis W Contrast  Result Date: 10/01/2019 CLINICAL DATA:  Abdominal pain, history of recent renal injury on the left initial encounter EXAM: CT ABDOMEN AND PELVIS WITH CONTRAST TECHNIQUE: Multidetector CT imaging of the abdomen and pelvis was performed using the standard protocol following bolus administration of intravenous contrast. CONTRAST:  61m OMNIPAQUE IOHEXOL 300 MG/ML  SOLN COMPARISON:  09/15/2019 FINDINGS: Lower chest: Small left pleural effusion is noted with associated atelectatic changes slightly larger than that seen on the prior exam. Small right-sided pleural effusion is noted with atelectatic changes as well. Hepatobiliary: Multiple gallstones are noted within the gallbladder. No wall thickening or pericholecystic fluid is noted. The liver is within normal limits and stable. Pancreas: Unremarkable. No pancreatic ductal dilatation or surrounding inflammatory changes. Spleen: Normal in size without focal abnormality. Adrenals/Urinary Tract: Adrenal glands are within normal limits. Right kidney demonstrates a normal enhancement pattern with cystic change stable in appearance from the prior exam. No renal calculi or obstructive changes are noted. The bladder is partially distended. The left kidney again demonstrates subcapsular hematoma. This measures approximately 8.7 x 3.4 cm in dimension with mixed attenuation consistent with  a more subacute process. No acute hemorrhage is seen. Mass effect upon the left kidney is noted with some decreased enhancement seen in the mid to lower pole posteriorly stable from the prior study. Changes of prior renal artery embolization are noted. Perinephric stranding on the left is seen. Area of previous  hemorrhage along the left psoas muscle is noted. This is stable from the prior exam. Stomach/Bowel: Mild retained fecal material is noted within the colon. No obstructive changes are seen. The appendix is not well visualized. No inflammatory changes are seen. There are multiple loops of small bowel identified within an anterior abdominal wall hernia stable from the prior exam. No obstructive changes are seen. The stomach is within normal limits. Vascular/Lymphatic: Aortic atherosclerosis. 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 No obstructive changes are seen. No enlarged abdominal or pelvic lymph nodes. Reproductive: Prostate is unremarkable. Other: No abdominal wall hernia or abnormality. No abdominopelvic ascites. Musculoskeletal: Degenerative changes of lumbar spine are noted. Fractures of the posterolateral aspects of the left ninth, tenth and eleventh ribs are seen better visualized on than on the prior exam. IMPRESSION: Changes consistent with the known prior left perirenal hematoma. Mass effect on the left kidney is noted with decreased perfusion inferiorly which may represent changes of Page kidney. No findings to suggest active extravasation are noted. Prior embolization is seen. Stable dilatation of the infrarenal aorta. Anterior abdominal wall hernia with multiple loops of small bowel are noted within no obstructive changes are noted. Fractures of the left ninth through eleventh ribs posteriorly. Cholelithiasis. Bilateral pleural effusions left greater than right. Electronically Signed   By: Inez Catalina M.D.   On: 10/01/2019 02:04   Ct Abdomen Pelvis W Contrast  Result Date: 09/15/2019 CLINICAL DATA:  Chest trauma, fall, pain in ribs and back, history of colon cancer EXAM: CT CHEST, ABDOMEN, AND PELVIS WITH CONTRAST TECHNIQUE: Multidetector CT imaging of the chest, abdomen and pelvis was performed following the standard protocol during bolus administration of intravenous  contrast. CONTRAST:  2m OMNIPAQUE IOHEXOL 300 MG/ML  SOLN COMPARISON:  None. FINDINGS: CT CHEST FINDINGS Cardiovascular: Aortic atherosclerosis. Cardiomegaly. Three-vessel coronary artery calcifications. No pericardial effusion. Mediastinum/Nodes: No enlarged mediastinal, hilar, or axillary lymph nodes. Thyroid gland, trachea, and esophagus demonstrate no significant findings. Lungs/Pleura: Lungs are clear. Large left-sided diaphragmatic eventration. Trace left pleural effusion. Musculoskeletal: No chest wall mass or suspicious bone lesions identified. There are minimally displaced fractures of the lateral and posterior left seventh through eleventh ribs. CT ABDOMEN PELVIS FINDINGS Hepatobiliary: No solid liver abnormality is seen. Gallstones. No gallbladder wall thickening, or biliary dilatation. Pancreas: Unremarkable. No pancreatic ductal dilatation or surrounding inflammatory changes. Spleen: Normal in size without significant abnormality. Adrenals/Urinary Tract: Adrenal glands are unremarkable. There is a laceration and large hematoma of the inferior pole of the left kidney with multiple internal foci of contrast attenuation concerning for active extravasation (series 3, image 63, 69). There is extensive fluid and contrast in the perirenal fascia and retroperitoneal soft tissues overlying the psoas. Bladder is unremarkable. Stomach/Bowel: Stomach is within normal limits. No evidence of bowel wall thickening, distention, or inflammatory changes. Evidence of prior sigmoid resection and reanastomosis Vascular/Lymphatic: Aortic atherosclerosis. Infrarenal abdominal aortic aneurysm measuring 4.2 x 4.1 cm. No enlarged abdominal or pelvic lymph nodes. Reproductive: No mass or other abnormality. Other: There is a midline ventral hernia containing nonobstructed loops of small bowel, hernia sac measuring 11.2 x 3.7 cm and neck measuring 4.8 cm (series 3, image 93,  series 7, image 112). No abdominopelvic ascites.  Musculoskeletal: No acute or significant osseous findings. IMPRESSION: 1. There is a laceration and large hematoma of the inferior pole of the left kidney with multiple internal foci of contrast attenuation concerning for active extravasation (series 3, image 63, 69). There is extensive fluid and contrast in the perirenal fascia and retroperitoneal soft tissues overlying the psoas. 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 2. There are minimally displaced fractures of the overlying lateral and posterior left seventh through eleventh ribs. 3.  Trace left pleural effusion. 4. 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 5. There is a midline ventral hernia containing nonobstructed loops of small bowel, hernia sac measuring 11.2 x 3.7 cm and neck measuring 4.8 cm (series 3, image 93, series 7, image 112). 6. Infrarenal abdominal aortic aneurysm measuring 4.2 x 4.1 cm. Aortic Atherosclerosis (ICD10-I70.0). 7.  Cardiomegaly and coronary artery disease. 8.  Evidence of prior sigmoid colon resection and reanastomosis. 9.  Cholelithiasis. These results were called by telephone at the time of interpretation on 09/15/2019 at 12:44 pm to provider Veryl Speak , who verbally acknowledged these results. Electronically Signed   By: Eddie Candle M.D.   On: 09/15/2019 12:49   Ir Angiogram Renal Uni Selective  Result Date: 09/15/2019 INDICATION: Fall on anticoagulation, now with left-sided renal fractures and left renal laceration. Request made for left renal arteriogram and potential embolization. Request also made for placement of a image guided central venous catheter for durable intravenous access. EXAM: 1. ULTRASOUND GUIDANCE FOR VENOUS ACCESS 2. FLUOROSCOPIC GUIDED PLACEMENT OF A NON TUNNELED TRIPLE-LUMEN CENTRAL VENOUS CATHETER. 3. ULTRASOUND GUIDANCE FOR ARTERIAL ACCESS 4. FLUSH  ABDOMINAL AORTOGRAM 5. SELECTIVE LEFT RENAL ARTERIOGRAM 6. SUB SELECTIVE LEFT ARTERIOGRAM AND PERCUTANEOUS COIL EMBOLIZATION COMPARISON:  CT the chest, abdomen and pelvis - earlier same day MEDICATIONS: NONE ANESTHESIA/SEDATION: Moderate (conscious) sedation was employed during this procedure. A total of Versed 2 mg and Fentanyl 100 mcg was administered intravenously. Moderate Sedation Time: 85 minutes. The patient's level of consciousness and vital signs were monitored continuously by radiology nursing throughout the procedure under my direct supervision. CONTRAST:  75 cc Omnipaque 300 FLUOROSCOPY TIME:  24 minutes (5,638 mGy) COMPLICATIONS: None immediate. PROCEDURE: Informed consent was obtained from the patient following explanation of the procedure, risks, benefits and alternatives. The patient understands, agrees and consents for the procedure. All questions were addressed. A time out was performed prior to the initiation of the procedure. Maximal barrier sterile technique utilized including caps, mask, sterile gowns, sterile gloves, large sterile drape, hand hygiene, and Betadine prep. Attention was initially paid towards placement of a non tunneled central venous catheter. Under direct ultrasound guidance, the left internal jugular vein was accessed with a micropuncture kit. A micropuncture wire was utilized for measurement purposes. Under fluoroscopic guidance, a peel-away sheath was placed and a 15 cm triple-lumen non tunneled central venous catheter was inserted with tip terminating at the level the superior cavoatrial junction. Postprocedural spot fluoroscopic radiographic image was obtained. The central venous catheter was secured at the neck entrance site within interrupted suture. A dressing was applied. _________________________________________________________ The right femoral head was marked fluoroscopically. Under ultrasound guidance, the right common femoral artery was accessed with a  micropuncture kit after the overlying soft tissues were anesthetized with 1% lidocaine. An ultrasound image was saved for documentation purposes. The micropuncture sheath was exchanged  for a 5 [place] vascular sheath over a Bentson wire. A closure arteriogram was performed through the side of the sheath confirming access within the right common femoral artery. Over a Bentson wire, a Mickelson catheter was advanced to the level of the inferior aspect of the thoracic aorta where it was back bled and flushed. The catheter was then utilized to select the left renal artery and a selective left renal arteriogram was performed. Next, with the use of a fathom 14 microcatheter, a regular Renegade microcatheter was advanced into a midpole division of the left renal artery. Sub selective arteriogram was performed and the vessel was percutaneously coil embolized with multiple overlapping 2 mm, 3 mm and 4 mm diameter interlock coils. Next, the microcatheter was utilized to an additional midpole division of the left renal artery and a sub selective arteriogram was performed. Next, the vessel was percutaneously coil embolized with multiple overlapping 4 mm, 5 mm and 6 mm interlock coils. The microcatheter was retracted to the level of the main left renal artery and selective left arteriogram was performed. The microcatheter was removed and a completion arteriogram was performed via the [facility] catheter. Next, prolonged efforts were made to cannulate the known tiny accessory left renal artery however this ultimately proved difficult. As such, a flush aortogram was performed demonstrating patency of the accessory left renal artery. Unfortunately, despite prolonged efforts, the tiny accessory left renal artery could not be selectively cannulated, likely secondary to a combination of suspected narrowing involving the vessel's origin (as was demonstrated on preceding CT) as well as the patient's aneurysmal and tortuous abdominal  aorta. At this point, the procedure was terminated. All wires, catheters and sheaths were removed patient. Hemostasis was achieved at the right groin access site with deployment of an ExoSeal closure device and manual compression. A dressing applied. The patient tolerated the procedure well without immediate postprocedural complication. FINDINGS: Selective left renal arteriogram demonstrates marked irregularity and contrast extravasation arising from several midpole subsegmental renal arteries. As such, both co-dominant midpole segmental arteries were sub selective and percutaneously coil embolized. Completion arteriogram demonstrates a technically excellent result without evidence of residual contrast extravasation or vessel irregularity. Completion left renal arteriogram demonstrates patency of several superior pole segmental divisions without additional area contrast extravasation or vessel irregularity. Flush abdominal aortogram demonstrates patency of the known accessory tiny left renal artery. Despite prolonged efforts, the accessory left renal artery could not be selectively cannulated, likely secondary to a combination of suspected narrowing of the vessel's origin (as was demonstrated on preceding CT), as well as the patient's aneurysmal and tortuous abdominal aorta. IMPRESSION: 1. Technically successful percutaneous coil embolization of two mid pole segmental arteries supplying ill-defined areas of contrast extravasation and vessel irregularity. 2. Attempted, though unsuccessful, cannulation of the known tiny left accessory renal artery likely to secondary to a combination of suspected narrowing of the vessel's origin (as was demonstrated on preceding CT) as well as the patient's aneurysmal and tortuous abdominal aorta. 3. Successful image guided placement of a non tunneled right triple-lumen central venous catheter with tip terminating within the superior cavoatrial junction. The central venous catheter is  ready for immediate use. Electronically Signed   By: Sandi Mariscal M.D.   On: 09/15/2019 16:52   Ir Fluoro Guide Cv Line Right  Result Date: 09/15/2019 INDICATION: Fall on anticoagulation, now with left-sided renal fractures and left renal laceration. Request made for left renal arteriogram and potential embolization. Request also made for placement of a image guided central venous  catheter for durable intravenous access. EXAM: 1. ULTRASOUND GUIDANCE FOR VENOUS ACCESS 2. FLUOROSCOPIC GUIDED PLACEMENT OF A NON TUNNELED TRIPLE-LUMEN CENTRAL VENOUS CATHETER. 3. ULTRASOUND GUIDANCE FOR ARTERIAL ACCESS 4. FLUSH ABDOMINAL AORTOGRAM 5. SELECTIVE LEFT RENAL ARTERIOGRAM 6. SUB SELECTIVE LEFT ARTERIOGRAM AND PERCUTANEOUS COIL EMBOLIZATION COMPARISON:  CT the chest, abdomen and pelvis - earlier same day MEDICATIONS: NONE ANESTHESIA/SEDATION: Moderate (conscious) sedation was employed during this procedure. A total of Versed 2 mg and Fentanyl 100 mcg was administered intravenously. Moderate Sedation Time: 85 minutes. The patient's level of consciousness and vital signs were monitored continuously by radiology nursing throughout the procedure under my direct supervision. CONTRAST:  75 cc Omnipaque 300 FLUOROSCOPY TIME:  24 minutes (5,284 mGy) COMPLICATIONS: None immediate. PROCEDURE: Informed consent was obtained from the patient following explanation of the procedure, risks, benefits and alternatives. The patient understands, agrees and consents for the procedure. All questions were addressed. A time out was performed prior to the initiation of the procedure. Maximal barrier sterile technique utilized including caps, mask, sterile gowns, sterile gloves, large sterile drape, hand hygiene, and Betadine prep. Attention was initially paid towards placement of a non tunneled central venous catheter. Under direct ultrasound guidance, the left internal jugular vein was accessed with a micropuncture kit. A micropuncture wire was  utilized for measurement purposes. Under fluoroscopic guidance, a peel-away sheath was placed and a 15 cm triple-lumen non tunneled central venous catheter was inserted with tip terminating at the level the superior cavoatrial junction. Postprocedural spot fluoroscopic radiographic image was obtained. The central venous catheter was secured at the neck entrance site within interrupted suture. A dressing was applied. _________________________________________________________ The right femoral head was marked fluoroscopically. Under ultrasound guidance, the right common femoral artery was accessed with a micropuncture kit after the overlying soft tissues were anesthetized with 1% lidocaine. An ultrasound image was saved for documentation purposes. The micropuncture sheath was exchanged for a 5 [place] vascular sheath over a Bentson wire. A closure arteriogram was performed through the side of the sheath confirming access within the right common femoral artery. Over a Bentson wire, a Mickelson catheter was advanced to the level of the inferior aspect of the thoracic aorta where it was back bled and flushed. The catheter was then utilized to select the left renal artery and a selective left renal arteriogram was performed. Next, with the use of a fathom 14 microcatheter, a regular Renegade microcatheter was advanced into a midpole division of the left renal artery. Sub selective arteriogram was performed and the vessel was percutaneously coil embolized with multiple overlapping 2 mm, 3 mm and 4 mm diameter interlock coils. Next, the microcatheter was utilized to an additional midpole division of the left renal artery and a sub selective arteriogram was performed. Next, the vessel was percutaneously coil embolized with multiple overlapping 4 mm, 5 mm and 6 mm interlock coils. The microcatheter was retracted to the level of the main left renal artery and selective left arteriogram was performed. The microcatheter was  removed and a completion arteriogram was performed via the [facility] catheter. Next, prolonged efforts were made to cannulate the known tiny accessory left renal artery however this ultimately proved difficult. As such, a flush aortogram was performed demonstrating patency of the accessory left renal artery. Unfortunately, despite prolonged efforts, the tiny accessory left renal artery could not be selectively cannulated, likely secondary to a combination of suspected narrowing involving the vessel's origin (as was demonstrated on preceding CT) as well as the patient's aneurysmal and tortuous abdominal  aorta. At this point, the procedure was terminated. All wires, catheters and sheaths were removed patient. Hemostasis was achieved at the right groin access site with deployment of an ExoSeal closure device and manual compression. A dressing applied. The patient tolerated the procedure well without immediate postprocedural complication. FINDINGS: Selective left renal arteriogram demonstrates marked irregularity and contrast extravasation arising from several midpole subsegmental renal arteries. As such, both co-dominant midpole segmental arteries were sub selective and percutaneously coil embolized. Completion arteriogram demonstrates a technically excellent result without evidence of residual contrast extravasation or vessel irregularity. Completion left renal arteriogram demonstrates patency of several superior pole segmental divisions without additional area contrast extravasation or vessel irregularity. Flush abdominal aortogram demonstrates patency of the known accessory tiny left renal artery. Despite prolonged efforts, the accessory left renal artery could not be selectively cannulated, likely secondary to a combination of suspected narrowing of the vessel's origin (as was demonstrated on preceding CT), as well as the patient's aneurysmal and tortuous abdominal aorta. IMPRESSION: 1. Technically successful  percutaneous coil embolization of two mid pole segmental arteries supplying ill-defined areas of contrast extravasation and vessel irregularity. 2. Attempted, though unsuccessful, cannulation of the known tiny left accessory renal artery likely to secondary to a combination of suspected narrowing of the vessel's origin (as was demonstrated on preceding CT) as well as the patient's aneurysmal and tortuous abdominal aorta. 3. Successful image guided placement of a non tunneled right triple-lumen central venous catheter with tip terminating within the superior cavoatrial junction. The central venous catheter is ready for immediate use. Electronically Signed   By: Sandi Mariscal M.D.   On: 09/15/2019 16:52   Ir US Guide Vasc Access Right  Result Date: 09/15/2019 INDICATION: Fall on anticoagulation, now with left-sided renal fractures and left renal laceration. Request made for left renal arteriogram and potential embolization. Request also made for placement of a image guided central venous catheter for durable intravenous access. EXAM: 1. ULTRASOUND GUIDANCE FOR VENOUS ACCESS 2. FLUOROSCOPIC GUIDED PLACEMENT OF A NON TUNNELED TRIPLE-LUMEN CENTRAL VENOUS CATHETER. 3. ULTRASOUND GUIDANCE FOR ARTERIAL ACCESS 4. FLUSH ABDOMINAL AORTOGRAM 5. SELECTIVE LEFT RENAL ARTERIOGRAM 6. SUB SELECTIVE LEFT ARTERIOGRAM AND PERCUTANEOUS COIL EMBOLIZATION COMPARISON:  CT the chest, abdomen and pelvis - earlier same day MEDICATIONS: NONE ANESTHESIA/SEDATION: Moderate (conscious) sedation was employed during this procedure. A total of Versed 2 mg and Fentanyl 100 mcg was administered intravenously. Moderate Sedation Time: 85 minutes. The patient's level of consciousness and vital signs were monitored continuously by radiology nursing throughout the procedure under my direct supervision. CONTRAST:  75 cc Omnipaque 300 FLUOROSCOPY TIME:  24 minutes (3,559 mGy) COMPLICATIONS: None immediate. PROCEDURE: Informed consent was obtained from the  patient following explanation of the procedure, risks, benefits and alternatives. The patient understands, agrees and consents for the procedure. All questions were addressed. A time out was performed prior to the initiation of the procedure. Maximal barrier sterile technique utilized including caps, mask, sterile gowns, sterile gloves, large sterile drape, hand hygiene, and Betadine prep. Attention was initially paid towards placement of a non tunneled central venous catheter. Under direct ultrasound guidance, the left internal jugular vein was accessed with a micropuncture kit. A micropuncture wire was utilized for measurement purposes. Under fluoroscopic guidance, a peel-away sheath was placed and a 15 cm triple-lumen non tunneled central venous catheter was inserted with tip terminating at the level the superior cavoatrial junction. Postprocedural spot fluoroscopic radiographic image was obtained. The central venous catheter was secured at the neck entrance site within interrupted  suture. A dressing was applied. _________________________________________________________ The right femoral head was marked fluoroscopically. Under ultrasound guidance, the right common femoral artery was accessed with a micropuncture kit after the overlying soft tissues were anesthetized with 1% lidocaine. An ultrasound image was saved for documentation purposes. The micropuncture sheath was exchanged for a 5 [place] vascular sheath over a Bentson wire. A closure arteriogram was performed through the side of the sheath confirming access within the right common femoral artery. Over a Bentson wire, a Mickelson catheter was advanced to the level of the inferior aspect of the thoracic aorta where it was back bled and flushed. The catheter was then utilized to select the left renal artery and a selective left renal arteriogram was performed. Next, with the use of a fathom 14 microcatheter, a regular Renegade microcatheter was advanced into  a midpole division of the left renal artery. Sub selective arteriogram was performed and the vessel was percutaneously coil embolized with multiple overlapping 2 mm, 3 mm and 4 mm diameter interlock coils. Next, the microcatheter was utilized to an additional midpole division of the left renal artery and a sub selective arteriogram was performed. Next, the vessel was percutaneously coil embolized with multiple overlapping 4 mm, 5 mm and 6 mm interlock coils. The microcatheter was retracted to the level of the main left renal artery and selective left arteriogram was performed. The microcatheter was removed and a completion arteriogram was performed via the [facility] catheter. Next, prolonged efforts were made to cannulate the known tiny accessory left renal artery however this ultimately proved difficult. As such, a flush aortogram was performed demonstrating patency of the accessory left renal artery. Unfortunately, despite prolonged efforts, the tiny accessory left renal artery could not be selectively cannulated, likely secondary to a combination of suspected narrowing involving the vessel's origin (as was demonstrated on preceding CT) as well as the patient's aneurysmal and tortuous abdominal aorta. At this point, the procedure was terminated. All wires, catheters and sheaths were removed patient. Hemostasis was achieved at the right groin access site with deployment of an ExoSeal closure device and manual compression. A dressing applied. The patient tolerated the procedure well without immediate postprocedural complication. FINDINGS: Selective left renal arteriogram demonstrates marked irregularity and contrast extravasation arising from several midpole subsegmental renal arteries. As such, both co-dominant midpole segmental arteries were sub selective and percutaneously coil embolized. Completion arteriogram demonstrates a technically excellent result without evidence of residual contrast extravasation or  vessel irregularity. Completion left renal arteriogram demonstrates patency of several superior pole segmental divisions without additional area contrast extravasation or vessel irregularity. Flush abdominal aortogram demonstrates patency of the known accessory tiny left renal artery. Despite prolonged efforts, the accessory left renal artery could not be selectively cannulated, likely secondary to a combination of suspected narrowing of the vessel's origin (as was demonstrated on preceding CT), as well as the patient's aneurysmal and tortuous abdominal aorta. IMPRESSION: 1. Technically successful percutaneous coil embolization of two mid pole segmental arteries supplying ill-defined areas of contrast extravasation and vessel irregularity. 2. Attempted, though unsuccessful, cannulation of the known tiny left accessory renal artery likely to secondary to a combination of suspected narrowing of the vessel's origin (as was demonstrated on preceding CT) as well as the patient's aneurysmal and tortuous abdominal aorta. 3. Successful image guided placement of a non tunneled right triple-lumen central venous catheter with tip terminating within the superior cavoatrial junction. The central venous catheter is ready for immediate use. Electronically Signed   By: Eldridge Abrahams.D.  On: 09/15/2019 16:52   Ir US Guide Vasc Access Right  Result Date: 09/15/2019 INDICATION: Fall on anticoagulation, now with left-sided renal fractures and left renal laceration. Request made for left renal arteriogram and potential embolization. Request also made for placement of a image guided central venous catheter for durable intravenous access. EXAM: 1. ULTRASOUND GUIDANCE FOR VENOUS ACCESS 2. FLUOROSCOPIC GUIDED PLACEMENT OF A NON TUNNELED TRIPLE-LUMEN CENTRAL VENOUS CATHETER. 3. ULTRASOUND GUIDANCE FOR ARTERIAL ACCESS 4. FLUSH ABDOMINAL AORTOGRAM 5. SELECTIVE LEFT RENAL ARTERIOGRAM 6. SUB SELECTIVE LEFT ARTERIOGRAM AND PERCUTANEOUS COIL  EMBOLIZATION COMPARISON:  CT the chest, abdomen and pelvis - earlier same day MEDICATIONS: NONE ANESTHESIA/SEDATION: Moderate (conscious) sedation was employed during this procedure. A total of Versed 2 mg and Fentanyl 100 mcg was administered intravenously. Moderate Sedation Time: 85 minutes. The patient's level of consciousness and vital signs were monitored continuously by radiology nursing throughout the procedure under my direct supervision. CONTRAST:  75 cc Omnipaque 300 FLUOROSCOPY TIME:  24 minutes (4,098 mGy) COMPLICATIONS: None immediate. PROCEDURE: Informed consent was obtained from the patient following explanation of the procedure, risks, benefits and alternatives. The patient understands, agrees and consents for the procedure. All questions were addressed. A time out was performed prior to the initiation of the procedure. Maximal barrier sterile technique utilized including caps, mask, sterile gowns, sterile gloves, large sterile drape, hand hygiene, and Betadine prep. Attention was initially paid towards placement of a non tunneled central venous catheter. Under direct ultrasound guidance, the left internal jugular vein was accessed with a micropuncture kit. A micropuncture wire was utilized for measurement purposes. Under fluoroscopic guidance, a peel-away sheath was placed and a 15 cm triple-lumen non tunneled central venous catheter was inserted with tip terminating at the level the superior cavoatrial junction. Postprocedural spot fluoroscopic radiographic image was obtained. The central venous catheter was secured at the neck entrance site within interrupted suture. A dressing was applied. _________________________________________________________ The right femoral head was marked fluoroscopically. Under ultrasound guidance, the right common femoral artery was accessed with a micropuncture kit after the overlying soft tissues were anesthetized with 1% lidocaine. An ultrasound image was saved for  documentation purposes. The micropuncture sheath was exchanged for a 5 [place] vascular sheath over a Bentson wire. A closure arteriogram was performed through the side of the sheath confirming access within the right common femoral artery. Over a Bentson wire, a Mickelson catheter was advanced to the level of the inferior aspect of the thoracic aorta where it was back bled and flushed. The catheter was then utilized to select the left renal artery and a selective left renal arteriogram was performed. Next, with the use of a fathom 14 microcatheter, a regular Renegade microcatheter was advanced into a midpole division of the left renal artery. Sub selective arteriogram was performed and the vessel was percutaneously coil embolized with multiple overlapping 2 mm, 3 mm and 4 mm diameter interlock coils. Next, the microcatheter was utilized to an additional midpole division of the left renal artery and a sub selective arteriogram was performed. Next, the vessel was percutaneously coil embolized with multiple overlapping 4 mm, 5 mm and 6 mm interlock coils. The microcatheter was retracted to the level of the main left renal artery and selective left arteriogram was performed. The microcatheter was removed and a completion arteriogram was performed via the [facility] catheter. Next, prolonged efforts were made to cannulate the known tiny accessory left renal artery however this ultimately proved difficult. As such, a flush aortogram was performed demonstrating patency  of the accessory left renal artery. Unfortunately, despite prolonged efforts, the tiny accessory left renal artery could not be selectively cannulated, likely secondary to a combination of suspected narrowing involving the vessel's origin (as was demonstrated on preceding CT) as well as the patient's aneurysmal and tortuous abdominal aorta. At this point, the procedure was terminated. All wires, catheters and sheaths were removed patient. Hemostasis was  achieved at the right groin access site with deployment of an ExoSeal closure device and manual compression. A dressing applied. The patient tolerated the procedure well without immediate postprocedural complication. FINDINGS: Selective left renal arteriogram demonstrates marked irregularity and contrast extravasation arising from several midpole subsegmental renal arteries. As such, both co-dominant midpole segmental arteries were sub selective and percutaneously coil embolized. Completion arteriogram demonstrates a technically excellent result without evidence of residual contrast extravasation or vessel irregularity. Completion left renal arteriogram demonstrates patency of several superior pole segmental divisions without additional area contrast extravasation or vessel irregularity. Flush abdominal aortogram demonstrates patency of the known accessory tiny left renal artery. Despite prolonged efforts, the accessory left renal artery could not be selectively cannulated, likely secondary to a combination of suspected narrowing of the vessel's origin (as was demonstrated on preceding CT), as well as the patient's aneurysmal and tortuous abdominal aorta. IMPRESSION: 1. Technically successful percutaneous coil embolization of two mid pole segmental arteries supplying ill-defined areas of contrast extravasation and vessel irregularity. 2. Attempted, though unsuccessful, cannulation of the known tiny left accessory renal artery likely to secondary to a combination of suspected narrowing of the vessel's origin (as was demonstrated on preceding CT) as well as the patient's aneurysmal and tortuous abdominal aorta. 3. Successful image guided placement of a non tunneled right triple-lumen central venous catheter with tip terminating within the superior cavoatrial junction. The central venous catheter is ready for immediate use. Electronically Signed   By: Sandi Mariscal M.D.   On: 09/15/2019 16:52   Dg Chest Port 1  View  Result Date: 10/01/2019 CLINICAL DATA:  85 year old male with known right rib fractures from earlier fall. Concern for aspiration. EXAM: PORTABLE CHEST 1 VIEW COMPARISON:  Chest radiograph dated 09/23/2019 FINDINGS: Elevation of the left hemidiaphragm with left lung base atelectasis similar to prior radiograph. An area of apparent increased density in the right suprahilar region likely related to patient rotation and projection of mediastinal structure. Developing infiltrate is favored less likely. Clinical correlation is recommended. No large pleural effusion. There is no pneumothorax. Stable cardiomediastinal silhouette. Atherosclerotic calcification of the aorta. No acute osseous pathology. IMPRESSION: No interval change. Electronically Signed   By: Anner Crete M.D.   On: 10/01/2019 00:51   Dg Chest Port 1 View  Result Date: 09/23/2019 CLINICAL DATA:  Multiple closed left rib fractures. EXAM: PORTABLE CHEST 1 VIEW COMPARISON:  09/17/2019 FINDINGS: Elevation of the left hemidiaphragm with left base atelectasis. No pneumothorax. Coarsened interstitial opacities throughout the lungs could reflect chronic interstitial lung disease. Cardiomegaly. Multiple left rib fractures are noted. IMPRESSION: Stable exam. Electronically Signed   By: Rolm Baptise M.D.   On: 09/23/2019 08:47   Dg Chest Port 1 View  Result Date: 09/17/2019 CLINICAL DATA:  Shortness of breath.  Wheezing.  Weakness. EXAM: PORTABLE CHEST 1 VIEW COMPARISON:  09/16/2019; CT of the chest, abdomen and pelvis-09/15/2019 FINDINGS: Grossly unchanged enlarged cardiac silhouette and mediastinal contours with atherosclerotic plaque within a tortuous thoracic aorta. Stable position of support apparatus. There is persistent elevation/eventration of the left hemidiaphragm. Nodular heterogeneous opacities within the right lung are  unchanged. No new focal airspace opacities. No evidence of edema. No acute or aggressive osseous abnormalities.  Persistent gaseous distention of the colon. IMPRESSION: 1. Persistent mild elevation of the left hemidiaphragm with associated bibasilar opacities, likely atelectasis. 2.  Aortic Atherosclerosis (ICD10-I70.0). Electronically Signed   By: Sandi Mariscal M.D.   On: 09/17/2019 08:46   Dg Chest Port 1 View  Result Date: 09/16/2019 CLINICAL DATA:  Rib fractures -left EXAM: PORTABLE CHEST 1 VIEW COMPARISON:  09/15/2019 FINDINGS: RIGHT-sided central line tip overlies the superior vena cava. There is persistent cardiomegaly, stable in configuration. Dense atherosclerotic calcification of the thoracic aorta. Stable elevation of the LEFT hemidiaphragm. Interstitial thickening, likely related to mild edema. Distended bowel loops beneath the hemidiaphragms. IMPRESSION: 1. Stable cardiomegaly and mild edema. 2. Stable elevation of the LEFT hemidiaphragm. 3. Distended bowel loops beneath the hemidiaphragms. Electronically Signed   By: Nolon Nations M.D.   On: 09/16/2019 09:56   Dg Chest Port 1 View  Result Date: 09/15/2019 CLINICAL DATA:  Fall with chest pain EXAM: PORTABLE CHEST 1 VIEW COMPARISON:  Chest CT report 12/26/2009 FINDINGS: Low volume chest with generalized interstitial coarsening. There is asymmetric elevation of the left diaphragm not described on prior study. Borderline heart size given portable technique and rightward rotation. Aortic tortuosity. IMPRESSION: 1. Low volume chest interstitial coarsening, question pulmonary fibrosis. 2. Prominent elevation of the left diaphragm. 3. No definite acute finding. Electronically Signed   By: Monte Fantasia M.D.   On: 09/15/2019 10:06   Dg Swallowing Func-speech Pathology  Result Date: 09/20/2019 Objective Swallowing Evaluation: Type of Study: MBS-Modified Barium Swallow Study  Patient Details Name: [name] [name] MRN: [id number] Date of Birth: 1923-04-05 Today's Date: 09/20/2019 Time: SLP Start Time (ACUTE ONLY): 1100 -SLP Stop Time (ACUTE ONLY): 1120 SLP Time  Calculation (min) (ACUTE ONLY): 20 min Past Medical History: Past Medical History: Diagnosis Date . Arthritis  . Atrial fibrillation ([place])  . Cancer [facility])   colon ca - surgery, no chemo or radiation . Hypertension  . Right shoulder pain 09/30/12  recently Past Surgical History: Past Surgical History: Procedure Laterality Date . CATARACT EXTRACTION W/ INTRAOCULAR LENS IMPLANT    b/l . colon surgery for colon cancer  approx. 2006 . IR EMBO ART  VEN HEMORR LYMPH EXTRAV  INC GUIDE ROADMAPPING  09/15/2019 . IR FLUORO GUIDE CV LINE RIGHT  09/15/2019 . IR RENAL SUPRASEL UNI S&I MOD SED  09/15/2019 . IR US GUIDE VASC ACCESS RIGHT  09/15/2019 . IR US GUIDE VASC ACCESS RIGHT  09/15/2019 HPI: Pt is a 85 yo male admitted s/p fall with L rib fxs 7-11 and L kidney lac s/p IR emobolization 10/14. Pt was advancing his diet but developed increased SOB. CXR 10/16 showed bibasilar opacities, likely atelectasis. SLP swallow evaluation was ordered. PMH includes: HTN, colon cancer, Afib, arthritis, and self-reported GER  Subjective: pt alert, pleasant, SOB Assessment / Plan / Recommendation CHL IP CLINICAL IMPRESSIONS 09/20/2019 Clinical Impression Pt has a moderate oropharyngeal and cervical esophageal dysphagia, felt to be acute on chronic in nature. He has delayed oral transit with reduced bolus cohesion and mild oral residuals, suspect related to acute deconditioning. His oral residue often spills posteriorly to the valleculae, but he clears oral residue well with a second swallow. Small amounts of penetration occur during the swallow with thin liquids, but most of his penetration and aspiration occur after the swallow. His UES appears to be tight, with boluses partially entering his esophagus but then backflowing up into the  pharynx. This backflow often spills posteriorly over the arytenoids and into the larynx. It is difficult to visualize beyond the vocal folds, but silent aspiration was clearly observed with nectar thick liquids  and highly suspected with other consistencies. He had the least amount of pharyngeal clearance with soft solids, which were repeatedly penetrated, cleared with reflexive swallows, and then unfortunately repenetrated. Discussed with trauma PA my concern for aspiration across meals regardless of textures, which is likely not acute given what appears to be a primary esophageal source, but he is more likely to develop complications in his deconditioned state. Pt says that he has had his esophagus stretched in the past. Could consider GI involvement. Pt may be appropriate for comfort feeds, accepting the risk of aspiration but trying to reduce it as much as possible with small, single bites/sips of soft purees and thin liquids (although of course if pt is fully accepting of aspiration risk, he could try more solid foods as well). Thorough and frequent oral care would be important. Would use aspiration and esophageal precautions and provide assistance with careful hand feeding. Per PA, she and/or MD will discuss with pt further before making any decisions. Will continue to follow.  SLP Visit Diagnosis Dysphagia, pharyngoesophageal phase (R13.14) Attention and concentration deficit following -- Frontal lobe and executive function deficit following -- Impact on safety and function Moderate aspiration risk;Risk for inadequate nutrition/hydration   CHL IP TREATMENT RECOMMENDATION 09/20/2019 Treatment Recommendations Therapy as outlined in treatment plan below   Prognosis 09/20/2019 Prognosis for Safe Diet Advancement Fair Barriers to Reach Goals Time post onset Barriers/Prognosis Comment -- CHL IP DIET RECOMMENDATION 09/20/2019 SLP Diet Recommendations Other (Comment) Liquid Administration via -- Medication Administration Crushed with puree Compensations -- Postural Changes --   CHL IP OTHER RECOMMENDATIONS 09/20/2019 Recommended Consults Consider GI evaluation Oral Care Recommendations Oral care QID Other Recommendations  Have oral suction available   CHL IP FOLLOW UP RECOMMENDATIONS 09/20/2019 Follow up Recommendations 24 hour supervision/assistance   CHL IP FREQUENCY AND DURATION 09/20/2019 Speech Therapy Frequency (ACUTE ONLY) min 2x/week Treatment Duration 2 weeks      CHL IP ORAL PHASE 09/20/2019 Oral Phase Impaired Oral - Pudding Teaspoon -- Oral - Pudding Cup -- Oral - Honey Teaspoon -- Oral - Honey Cup -- Oral - Nectar Teaspoon -- Oral - Nectar Cup -- Oral - Nectar Straw Decreased bolus cohesion;Delayed oral transit;Lingual/palatal residue;Weak lingual manipulation;Reduced posterior propulsion Oral - Thin Teaspoon -- Oral - Thin Cup Decreased bolus cohesion;Delayed oral transit;Lingual/palatal residue;Weak lingual manipulation;Reduced posterior propulsion Oral - Thin Straw Decreased bolus cohesion;Delayed oral transit;Lingual/palatal residue;Weak lingual manipulation;Reduced posterior propulsion Oral - Puree Decreased bolus cohesion;Delayed oral transit;Lingual/palatal residue;Weak lingual manipulation;Reduced posterior propulsion Oral - Mech Soft Decreased bolus cohesion;Delayed oral transit;Lingual/palatal residue;Weak lingual manipulation;Reduced posterior propulsion Oral - Regular -- Oral - Multi-Consistency -- Oral - Pill -- Oral Phase - Comment --  CHL IP PHARYNGEAL PHASE 09/20/2019 Pharyngeal Phase Impaired Pharyngeal- Pudding Teaspoon -- Pharyngeal -- Pharyngeal- Pudding Cup -- Pharyngeal -- Pharyngeal- Honey Teaspoon -- Pharyngeal -- Pharyngeal- Honey Cup -- Pharyngeal -- Pharyngeal- Nectar Teaspoon -- Pharyngeal -- Pharyngeal- Nectar Cup -- Pharyngeal -- Pharyngeal- Nectar Straw Penetration/Apiration after swallow;Pharyngeal residue - cp segment;Pharyngeal residue - pyriform Pharyngeal Material enters airway, passes BELOW cords without attempt by patient to eject out (silent aspiration) Pharyngeal- Thin Teaspoon -- Pharyngeal -- Pharyngeal- Thin Cup Penetration/Aspiration during swallow;Penetration/Apiration after  swallow;Pharyngeal residue - cp segment;Pharyngeal residue - pyriform Pharyngeal Material enters airway, CONTACTS cords and not ejected out Pharyngeal- Thin Straw  Penetration/Aspiration during swallow;Penetration/Apiration after swallow;Pharyngeal residue - cp segment;Pharyngeal residue - pyriform Pharyngeal Material enters airway, CONTACTS cords and not ejected out Pharyngeal- Puree Penetration/Apiration after swallow;Pharyngeal residue - cp segment;Pharyngeal residue - pyriform Pharyngeal Material enters airway, remains ABOVE vocal cords and not ejected out Pharyngeal- Mechanical Soft Penetration/Apiration after swallow;Pharyngeal residue - cp segment;Pharyngeal residue - pyriform;Penetration/Aspiration during swallow Pharyngeal Material enters airway, remains ABOVE vocal cords and not ejected out Pharyngeal- Regular -- Pharyngeal -- Pharyngeal- Multi-consistency -- Pharyngeal -- Pharyngeal- Pill -- Pharyngeal -- Pharyngeal Comment --  CHL IP CERVICAL ESOPHAGEAL PHASE 09/20/2019 Cervical Esophageal Phase Impaired Pudding Teaspoon -- Pudding Cup -- Honey Teaspoon -- Honey Cup -- Nectar Teaspoon -- Nectar Cup -- Nectar Straw Reduced cricopharyngeal relaxation;Esophageal backflow into the pharynx;Esophageal backflow into the larynx Thin Teaspoon -- Thin Cup Reduced cricopharyngeal relaxation;Esophageal backflow into the pharynx;Esophageal backflow into the larynx Thin Straw Reduced cricopharyngeal relaxation;Esophageal backflow into the pharynx;Esophageal backflow into the larynx Puree Reduced cricopharyngeal relaxation;Esophageal backflow into the pharynx Mechanical Soft Reduced cricopharyngeal relaxation;Esophageal backflow into the pharynx;Esophageal backflow into the larynx Regular -- Multi-consistency -- Pill -- Cervical Esophageal Comment -- Venita Sheffield Nix 09/20/2019, 9:40 AM  Pollyann Glen, M.A. [facility] Pager [phone] Office [phone]             [place] Guide Roadmapping  Result Date: 09/15/2019 INDICATION: Fall on anticoagulation, now with left-sided renal fractures and left renal laceration. Request made for left renal arteriogram and potential embolization. Request also made for placement of a image guided central venous catheter for durable intravenous access. EXAM: 1. ULTRASOUND GUIDANCE FOR VENOUS ACCESS 2. FLUOROSCOPIC GUIDED PLACEMENT OF A NON TUNNELED TRIPLE-LUMEN CENTRAL VENOUS CATHETER. 3. ULTRASOUND GUIDANCE FOR ARTERIAL ACCESS 4. FLUSH ABDOMINAL AORTOGRAM 5. SELECTIVE LEFT RENAL ARTERIOGRAM 6. SUB SELECTIVE LEFT ARTERIOGRAM AND PERCUTANEOUS COIL EMBOLIZATION COMPARISON:  CT the chest, abdomen and pelvis - earlier same day MEDICATIONS: NONE ANESTHESIA/SEDATION: Moderate (conscious) sedation was employed during this procedure. A total of Versed 2 mg and Fentanyl 100 mcg was administered intravenously. Moderate Sedation Time: 85 minutes. The patient's level of consciousness and vital signs were monitored continuously by radiology nursing throughout the procedure under my direct supervision. CONTRAST:  75 cc Omnipaque 300 FLUOROSCOPY TIME:  24 minutes (6,384 mGy) COMPLICATIONS: None immediate. PROCEDURE: Informed consent was obtained from the patient following explanation of the procedure, risks, benefits and alternatives. The patient understands, agrees and consents for the procedure. All questions were addressed. A time out was performed prior to the initiation of the procedure. Maximal barrier sterile technique utilized including caps, mask, sterile gowns, sterile gloves, large sterile drape, hand hygiene, and Betadine prep. Attention was initially paid towards placement of a non tunneled central venous catheter. Under direct ultrasound guidance, the left internal jugular vein was accessed with a micropuncture kit. A micropuncture wire was utilized for measurement purposes. Under fluoroscopic guidance, a peel-away sheath was placed and a  15 cm triple-lumen non tunneled central venous catheter was inserted with tip terminating at the level the superior cavoatrial junction. Postprocedural spot fluoroscopic radiographic image was obtained. The central venous catheter was secured at the neck entrance site within interrupted suture. A dressing was applied. _________________________________________________________ The right femoral head was marked fluoroscopically. Under ultrasound guidance, the right common femoral artery was accessed with a micropuncture kit after the overlying soft tissues were anesthetized with 1% lidocaine. An ultrasound image was saved for documentation purposes. The micropuncture sheath was exchanged for a  5 [place] vascular sheath over a Bentson wire. A closure arteriogram was performed through the side of the sheath confirming access within the right common femoral artery. Over a Bentson wire, a Mickelson catheter was advanced to the level of the inferior aspect of the thoracic aorta where it was back bled and flushed. The catheter was then utilized to select the left renal artery and a selective left renal arteriogram was performed. Next, with the use of a fathom 14 microcatheter, a regular Renegade microcatheter was advanced into a midpole division of the left renal artery. Sub selective arteriogram was performed and the vessel was percutaneously coil embolized with multiple overlapping 2 mm, 3 mm and 4 mm diameter interlock coils. Next, the microcatheter was utilized to an additional midpole division of the left renal artery and a sub selective arteriogram was performed. Next, the vessel was percutaneously coil embolized with multiple overlapping 4 mm, 5 mm and 6 mm interlock coils. The microcatheter was retracted to the level of the main left renal artery and selective left arteriogram was performed. The microcatheter was removed and a completion arteriogram was performed via the [facility] catheter. Next, prolonged efforts  were made to cannulate the known tiny accessory left renal artery however this ultimately proved difficult. As such, a flush aortogram was performed demonstrating patency of the accessory left renal artery. Unfortunately, despite prolonged efforts, the tiny accessory left renal artery could not be selectively cannulated, likely secondary to a combination of suspected narrowing involving the vessel's origin (as was demonstrated on preceding CT) as well as the patient's aneurysmal and tortuous abdominal aorta. At this point, the procedure was terminated. All wires, catheters and sheaths were removed patient. Hemostasis was achieved at the right groin access site with deployment of an ExoSeal closure device and manual compression. A dressing applied. The patient tolerated the procedure well without immediate postprocedural complication. FINDINGS: Selective left renal arteriogram demonstrates marked irregularity and contrast extravasation arising from several midpole subsegmental renal arteries. As such, both co-dominant midpole segmental arteries were sub selective and percutaneously coil embolized. Completion arteriogram demonstrates a technically excellent result without evidence of residual contrast extravasation or vessel irregularity. Completion left renal arteriogram demonstrates patency of several superior pole segmental divisions without additional area contrast extravasation or vessel irregularity. Flush abdominal aortogram demonstrates patency of the known accessory tiny left renal artery. Despite prolonged efforts, the accessory left renal artery could not be selectively cannulated, likely secondary to a combination of suspected narrowing of the vessel's origin (as was demonstrated on preceding CT), as well as the patient's aneurysmal and tortuous abdominal aorta. IMPRESSION: 1. Technically successful percutaneous coil embolization of two mid pole segmental arteries supplying ill-defined areas of contrast  extravasation and vessel irregularity. 2. Attempted, though unsuccessful, cannulation of the known tiny left accessory renal artery likely to secondary to a combination of suspected narrowing of the vessel's origin (as was demonstrated on preceding CT) as well as the patient's aneurysmal and tortuous abdominal aorta. 3. Successful image guided placement of a non tunneled right triple-lumen central venous catheter with tip terminating within the superior cavoatrial junction. The central venous catheter is ready for immediate use. Electronically Signed   By: Sandi Mariscal M.D.   On: 09/15/2019 16:52     Subjective: Patient seen and examined.  No overnight events.  Denies any pain.  Patient states "I had some gas pain and is all gone"   Discharge Exam: Vitals:   10/01/19 2212 10/02/19 0515  BP: 110/60 101/64  Pulse: 78  82  Resp: (!) 22 (!) 22  Temp: 98.6 F (37 C) (!) 97.5 F (36.4 C)  SpO2:     Vitals:   10/01/19 1521 10/01/19 1549 10/01/19 2212 10/02/19 0515  BP: (!) 113/56 116/63 110/60 101/64  Pulse: 85 70 78 82  Resp: (!) 23 (!) 23 (!) 22 (!) 22  Temp:  97.8 F (36.6 C) 98.6 F (37 C) (!) 97.5 F (36.4 C)  TempSrc:  Axillary Oral Oral  SpO2: 95% 100%    Weight:      Height:        General: Pt is alert, awake, not in acute distress, on 1 to 2 L of oxygen.  Not in any distress.  Eating breakfast. Cardiovascular: RRR, S1/S2 +, no rubs, no gallops Respiratory: CTA bilaterally, no wheezing, no rhonchi Abdominal: Soft, NT, ND, bowel sounds +, obese.  No rigidity or tenderness. Extremities: no edema, no cyanosis    The results of significant diagnostics from this hospitalization (including imaging, microbiology, ancillary and laboratory) are listed below for reference.     Microbiology: Recent Results (from the past 240 hour(s))  SARS CORONAVIRUS 2 (TAT 6-24 HRS) Nasopharyngeal Nasopharyngeal Swab     Status: None   Collection Time: 09/27/19 11:22 AM   Specimen:  Nasopharyngeal Swab  Result Value Ref Range Status   SARS Coronavirus 2 NEGATIVE NEGATIVE Final    Comment: (NOTE) SARS-CoV-2 target nucleic acids are NOT DETECTED. The SARS-CoV-2 RNA is generally detectable in upper and lower respiratory specimens during the acute phase of infection. Negative results do not preclude SARS-CoV-2 infection, do not rule out co-infections with other pathogens, and should not be used as the sole basis for treatment or other patient management decisions. Negative results must be combined with clinical observations, patient history, and epidemiological information. The expected result is Negative. Fact Sheet for Patients: SugarRoll.be Fact Sheet for Healthcare Providers: https://www.woods-Lamyiah Crawshaw.com/ This test is not yet approved or cleared by the [place] FDA and  has been authorized for detection and/or diagnosis of SARS-CoV-2 by FDA under an Emergency Use Authorization (EUA). This EUA will remain  in effect (meaning this test can be used) for the duration of the COVID-19 declaration under Section 56 4(b)(1) of the Act, 21 U.S.C. section 360bbb-3(b)(1), unless the authorization is terminated or revoked sooner. Performed at [facility], [place] [address] [place], [place] [address]   Blood Culture (routine x 2)     Status: None (Preliminary result)   Collection Time: 10/01/19 12:11 AM   Specimen: BLOOD RIGHT FOREARM  Result Value Ref Range Status   Specimen Description   Final    BLOOD RIGHT FOREARM Performed at [facility], [place] [address], [place], [place] [address]    Special Requests   Final    BOTTLES DRAWN AEROBIC AND ANAEROBIC Blood Culture results may not be optimal due to an inadequate volume of blood received in culture bottles Performed at [facility] [address] [place], [place] [address]    Culture   Final    NO GROWTH 1 DAY Performed at [facility], [place] [address] [place], [place] [address]    Report Status PENDING  Incomplete  SARS CORONAVIRUS 2 (TAT 6-24 HRS) Nasopharyngeal Nasopharyngeal Swab     Status: None   Collection Time: 10/01/19 12:13 AM   Specimen: Nasopharyngeal Swab  Result Value Ref Range Status   SARS Coronavirus 2 NEGATIVE NEGATIVE Final    Comment: (NOTE) SARS-CoV-2  target nucleic acids are NOT DETECTED. The SARS-CoV-2 RNA is generally detectable in upper and lower respiratory specimens during the acute phase of infection. Negative results do not preclude SARS-CoV-2 infection, do not rule out co-infections with other pathogens, and should not be used as the sole basis for treatment or other patient management decisions. Negative results must be combined with clinical observations, patient history, and epidemiological information. The expected result is Negative. Fact Sheet for Patients: SugarRoll.be Fact Sheet for Healthcare Providers: https://www.woods-mathews.com/ This test is not yet approved or cleared by the [place] FDA and  has been authorized for detection and/or diagnosis of SARS-CoV-2 by FDA under an Emergency Use Authorization (EUA). This EUA will remain  in effect (meaning this test can be used) for the duration of the COVID-19 declaration under Section 56 4(b)(1) of the Act, 21 U.S.C. section 360bbb-3(b)(1), unless the authorization is terminated or revoked sooner. Performed at [facility], [place] [address] [place], [place] [address]   Blood Culture (routine x 2)     Status: None (Preliminary result)   Collection Time: 10/01/19 12:16 AM   Specimen: BLOOD LEFT WRIST  Result Value Ref Range Status   Specimen Description   Final    BLOOD LEFT WRIST Performed at [facility] [address] [place], [place] [address]    Special Requests   Final    BOTTLES DRAWN AEROBIC AND ANAEROBIC Blood Culture adequate volume Performed at  [facility] [address], [place], [place] [address]    Culture   Final    NO GROWTH 1 DAY Performed at [facility], [place] [address], [place], [place] [address]    Report Status PENDING  Incomplete     Labs: BNP (last 3 results) No results for input(s): BNP in the last 8760 hours. Basic Metabolic Panel: Recent Labs  Lab 09/27/19 0216 10/01/19 0011 10/01/19 0838 10/01/19 0959 10/02/19 0450  NA 136 132* 138 136 135  K 3.5 4.2 2.6* 3.3* 4.2  CL 100 97* 114* 105 101  CO2 26 26 19* 24 25  GLUCOSE 114* 114* 81 101* 120*  BUN 40* 46* 32* 39* 44*  CREATININE 1.29* 1.44* 0.85 1.08 1.23  CALCIUM 8.2* 8.2* 5.4* 6.8* 8.5*  MG  --   --   --   --  2.1   Liver Function Tests: Recent Labs  Lab 10/01/19 0011  AST 49*  ALT 32  ALKPHOS 76  BILITOT 1.6*  PROT 5.8*  ALBUMIN 2.4*   No results for input(s): LIPASE, AMYLASE in the last 168 hours. No results for input(s): AMMONIA in the last 168 hours. CBC: Recent Labs  Lab 09/26/19 0226 10/01/19 0011 10/01/19 0838 10/01/19 0959 10/02/19 0450  WBC 9.8 12.3* 9.1 10.9* 10.1  NEUTROABS  --  9.1*  --   --  7.6  HGB 10.8* 11.0* 11.0* 10.2* 11.4*  HCT 32.7* 34.6* 34.9* 32.9* 37.0*  MCV 95.9 100.3* 102.6* 103.1* 103.1*  PLT 340 358 291 330 369   Cardiac Enzymes: No results for input(s): CKTOTAL, CKMB, CKMBINDEX, TROPONINI in the last 168 hours. BNP: Invalid input(s): POCBNP CBG: No results for input(s): GLUCAP in the last 168 hours. D-Dimer No results for input(s): DDIMER in the last 72 hours. Hgb A1c No results for input(s): HGBA1C in the last 72 hours. Lipid Profile No results for input(s): CHOL, HDL, LDLCALC, TRIG, CHOLHDL, LDLDIRECT in the last 72 hours. Thyroid function studies No results for input(s): TSH, T4TOTAL,  T3FREE, THYROIDAB in the last 72 hours.  Invalid input(s): FREET3 Anemia work up No results for input(s): VITAMINB12, FOLATE, FERRITIN, TIBC, IRON, RETICCTPCT in the last 72  hours. Urinalysis    Component Value Date/Time   COLORURINE YELLOW 10/01/2019 0825   APPEARANCEUR CLEAR 10/01/2019 0825   LABSPEC 1.024 10/01/2019 0825   PHURINE 5.0 10/01/2019 0825   GLUCOSEU NEGATIVE 10/01/2019 0825   HGBUR NEGATIVE 10/01/2019 0825   BILIRUBINUR NEGATIVE 10/01/2019 0825   KETONESUR NEGATIVE 10/01/2019 0825   PROTEINUR NEGATIVE 10/01/2019 0825   NITRITE NEGATIVE 10/01/2019 0825   LEUKOCYTESUR NEGATIVE 10/01/2019 0825   Sepsis Labs Invalid input(s): PROCALCITONIN,  WBC,  LACTICIDVEN Microbiology Recent Results (from the past 240 hour(s))  SARS CORONAVIRUS 2 (TAT 6-24 HRS) Nasopharyngeal Nasopharyngeal Swab     Status: None   Collection Time: 09/27/19 11:22 AM   Specimen: Nasopharyngeal Swab  Result Value Ref Range Status   SARS Coronavirus 2 NEGATIVE NEGATIVE Final    Comment: (NOTE) SARS-CoV-2 target nucleic acids are NOT DETECTED. The SARS-CoV-2 RNA is generally detectable in upper and lower respiratory specimens during the acute phase of infection. Negative results do not preclude SARS-CoV-2 infection, do not rule out co-infections with other pathogens, and should not be used as the sole basis for treatment or other patient management decisions. Negative results must be combined with clinical observations, patient history, and epidemiological information. The expected result is Negative. Fact Sheet for Patients: SugarRoll.be Fact Sheet for Healthcare Providers: https://www.woods-mathews.com/ This test is not yet approved or cleared by the [place] FDA and  has been authorized for detection and/or diagnosis of SARS-CoV-2 by FDA under an Emergency Use Authorization (EUA). This EUA will remain  in effect (meaning this test can be used) for the duration of the COVID-19 declaration under Section 56 4(b)(1) of the Act, 21 U.S.C. section 360bbb-3(b)(1), unless the authorization is terminated or revoked  sooner. Performed at [facility], [place] [address], [place], [place] [address]   Blood Culture (routine x 2)     Status: None (Preliminary result)   Collection Time: 10/01/19 12:11 AM   Specimen: BLOOD RIGHT FOREARM  Result Value Ref Range Status   Specimen Description   Final    BLOOD RIGHT FOREARM Performed at [facility], [place] [address], [place], [place] [address]    Special Requests   Final    BOTTLES DRAWN AEROBIC AND ANAEROBIC Blood Culture results may not be optimal due to an inadequate volume of blood received in culture bottles Performed at [facility] [address] [place], [place] [address]    Culture   Final    NO GROWTH 1 DAY Performed at [facility], [place] [address], [place], [place] [address]    Report Status PENDING  Incomplete  SARS CORONAVIRUS 2 (TAT 6-24 HRS) Nasopharyngeal Nasopharyngeal Swab     Status: None   Collection Time: 10/01/19 12:13 AM   Specimen: Nasopharyngeal Swab  Result Value Ref Range Status   SARS Coronavirus 2 NEGATIVE NEGATIVE Final    Comment: (NOTE) SARS-CoV-2 target nucleic acids are NOT DETECTED. The SARS-CoV-2 RNA is generally detectable in upper and lower respiratory specimens during the acute phase of infection. Negative results do not preclude SARS-CoV-2 infection, do not rule out co-infections with other pathogens, and should not be used as the sole basis for treatment or other patient management decisions. Negative results must be combined with clinical observations, patient history, and epidemiological information. The expected result  is Negative. Fact Sheet for Patients: SugarRoll.be Fact Sheet for Healthcare Providers: https://www.woods-Ryelee Albee.com/ This test is not yet approved or cleared by the [place] FDA and  has been authorized for detection and/or diagnosis of SARS-CoV-2 by FDA under an Emergency Use Authorization (EUA). This EUA  will remain  in effect (meaning this test can be used) for the duration of the COVID-19 declaration under Section 56 4(b)(1) of the Act, 21 U.S.C. section 360bbb-3(b)(1), unless the authorization is terminated or revoked sooner. Performed at [facility], [place] [address] [place], [place] [address]   Blood Culture (routine x 2)     Status: None (Preliminary result)   Collection Time: 10/01/19 12:16 AM   Specimen: BLOOD LEFT WRIST  Result Value Ref Range Status   Specimen Description   Final    BLOOD LEFT WRIST Performed at [facility] [address] [place], [place] [address]    Special Requests   Final    BOTTLES DRAWN AEROBIC AND ANAEROBIC Blood Culture adequate volume Performed at [facility] [address] [place], [place] [address]    Culture   Final    NO GROWTH 1 DAY Performed at [facility], [place] [address], [place], [place] [address]    Report Status PENDING  Incomplete     Time coordinating discharge:  35 minutes  SIGNED:   Barb Merino, MD  [facility] 10/02/2019, 11:00 AM

## 2019-10-03 DIAGNOSIS — R6510 Systemic inflammatory response syndrome (SIRS) of non-infectious origin without acute organ dysfunction: Secondary | ICD-10-CM | POA: Diagnosis not present

## 2019-10-03 NOTE — Progress Notes (Signed)
PROGRESS NOTE    [name] [name]  [id number] DOB: [birth year] DOA: 09/30/2019 PCP: [name], MD   Brief Narrative:85 y.o. male with medical history significant of A.Fib off of anticoags following trauma admit this month, HTN.  Patient with recent admission from 10/14-10/27 after falling out of bed in the SNF.  3 rib fx and a L kidney laceration.  Embolization performed on L kidney.  Patient presents to the ED this evening with c/o abd pain.  Says onset yesterday.  Diffuse.  Wondering if its just "gas" pain.  EMS reports fever at SNF yesterday.  SNF staff concerned about possible aspiration yesterday.   ED Course: Patient denies SOB though he is tachypnic here in the ED.  Temp is 99.4.  WBC 12k.  Satting 95% on RA.  Creat 1.44 was 1.3 on discharge.  Lactate nl, procalcitonin nl.  CT abd pelvis shows stable L perirenal hematoma, no active bleed, mass effect on L kidney with decreased perfusion.  Also shows fractures of L 9th-11th ribs, bilateral small pleural effusions L>R.  CXR neg.  COVID, UA, BCx are pending.  BP in ED is running low (123XX123 systolic), EDP ordered ABx and requests admit for SIRS / ? Sepsis  Assessment & Plan:   Principal Problem:   SIRS (systemic inflammatory response syndrome) (HCC) Active Problems:   HTN (hypertension)   A-fib (HCC)   Kidney laceration, left   Abdominal pain   Multiple rib fractures   CKD (chronic kidney disease) stage 3, GFR 30-59 ml/min   Abdominal pain: Patient with recent trauma and splenic laceration.  Underwent repeat CAT scan and found to be stable.  No complications.  He is in the hospital for more than 24 hours and has not needed any pain medications.  Resolved.  No indication to continue to use opiates.  Please use adequate stool softener and laxatives to avoid constipation.  Mobility will help.  SIRS: Sepsis ruled out. Patient presented with reported temperature of 99.4, WBC count of 12.3 and blood  pressure of 90 systolic.  In the emergency room he was started on sepsis pathway, 1 L IV fluid bolus, Rocephin and vancomycin were given.  Extensive investigation did not reveal any evidence of infection.  COVID-19 was negative.  Lactic acid was normal.  Procalcitonin was normal.  He was monitored off antibiotics after initial dose of antibiotics.  Final urine cultures and blood cultures no growth so far.  Probably related to dehydration and poor oral intake.  Sepsis ruled out.  Concern for aspiration: Patient definitely is high risk of aspiration.  Currently no evidence of aspiration pneumonia.  He was seen by speech therapy in the past, had a speech evaluation and modified barium swallow.  Patient aspirates on all consistencies.  Patient is not a good candidate for artificial feeding and he does not want to eat.  Patient however wants to eat as much he likes.  Patient will aspirate, so he will need supervised feeding and all dysphagia precautions.  He has agreed on dysphagia 2 diet with nectar thick liquid.  Hypertension: Presenting with low blood pressures and dehydration.  He will continue metoprolol for heart rate control, however will discontinue other antihypertensives.   Advance care planning: Palliative care consult SNF.    Estimated body mass index is 33.77 kg/m as calculated from the following:   Height as of this encounter: 6' (1.829 m).   Weight as of this encounter: 112.9 kg.  DVT prophylaxis: scd Code Status: Full code Family  Communication: Discussed with patient Disposition Plan: Pending insurance authorization  Consultants:   Palliative care  Procedures: None Antimicrobials: None Subjective: Resting in bed no new complaints denies nausea vomiting  Objective: Vitals:   10/02/19 1859 10/02/19 2215 10/03/19 0601 10/03/19 1226  BP: 111/63 111/72 115/77 123/87  Pulse: 81 84 97 89  Resp: (!) 27 16 18 20   Temp: 98.4 F (36.9 C) 97.9 F (36.6 C) 98.1 F (36.7 C) 98.7  F (37.1 C)  TempSrc: Oral Oral Oral Oral  SpO2: 100% 98% 98% 100%  Weight:      Height:        Intake/Output Summary (Last 24 hours) at 10/03/2019 1325 Last data filed at 10/03/2019 1100 Gross per 24 hour  Intake 680 ml  Output -  Net 680 ml   Filed Weights   09/30/19 2357  Weight: 112.9 kg    Examination:  General exam: Appears calm and comfortable  Respiratory system: Clear to auscultation. Respiratory effort normal. Cardiovascular system: S1 & S2 heard, RRR. No JVD, murmurs, rubs, gallops or clicks. No pedal edema. Gastrointestinal system: Abdomen is nondistended, soft and nontender. No organomegaly or masses felt. Normal bowel sounds heard. Central nervous system: Alert and oriented. No focal neurological deficits. Extremities: Symmetric 5 x 5 power. Skin: No rashes, lesions or ulcers Psychiatry: Judgement and insight appear normal. Mood & affect appropriate.     Data Reviewed: I have personally reviewed following labs and imaging studies  CBC: Recent Labs  Lab 10/01/19 0011 10/01/19 0838 10/01/19 0959 10/02/19 0450  WBC 12.3* 9.1 10.9* 10.1  NEUTROABS 9.1*  --   --  7.6  HGB 11.0* 11.0* 10.2* 11.4*  HCT 34.6* 34.9* 32.9* 37.0*  MCV 100.3* 102.6* 103.1* 103.1*  PLT 358 291 330 0000000   Basic Metabolic Panel: Recent Labs  Lab 09/27/19 0216 10/01/19 0011 10/01/19 0838 10/01/19 0959 10/02/19 0450  NA 136 132* 138 136 135  K 3.5 4.2 2.6* 3.3* 4.2  CL 100 97* 114* 105 101  CO2 26 26 19* 24 25  GLUCOSE 114* 114* 81 101* 120*  BUN 40* 46* 32* 39* 44*  CREATININE 1.29* 1.44* 0.85 1.08 1.23  CALCIUM 8.2* 8.2* 5.4* 6.8* 8.5*  MG  --   --   --   --  2.1   GFR: Estimated Creatinine Clearance: 47.6 mL/min (by C-G formula based on SCr of 1.23 mg/dL). Liver Function Tests: Recent Labs  Lab 10/01/19 0011  AST 49*  ALT 32  ALKPHOS 76  BILITOT 1.6*  PROT 5.8*  ALBUMIN 2.4*   No results for input(s): LIPASE, AMYLASE in the last 168 hours. No results for  input(s): AMMONIA in the last 168 hours. Coagulation Profile: Recent Labs  Lab 10/01/19 0011  INR 1.1   Cardiac Enzymes: No results for input(s): CKTOTAL, CKMB, CKMBINDEX, TROPONINI in the last 168 hours. BNP (last 3 results) No results for input(s): PROBNP in the last 8760 hours. HbA1C: No results for input(s): HGBA1C in the last 72 hours. CBG: No results for input(s): GLUCAP in the last 168 hours. Lipid Profile: No results for input(s): CHOL, HDL, LDLCALC, TRIG, CHOLHDL, LDLDIRECT in the last 72 hours. Thyroid Function Tests: No results for input(s): TSH, T4TOTAL, FREET4, T3FREE, THYROIDAB in the last 72 hours. Anemia Panel: No results for input(s): VITAMINB12, FOLATE, FERRITIN, TIBC, IRON, RETICCTPCT in the last 72 hours. Sepsis Labs: Recent Labs  Lab 10/01/19 0011  PROCALCITON <0.10  LATICACIDVEN 1.2    Recent Results (from the past 240  hour(s))  SARS CORONAVIRUS 2 (TAT 6-24 HRS) Nasopharyngeal Nasopharyngeal Swab     Status: None   Collection Time: 09/27/19 11:22 AM   Specimen: Nasopharyngeal Swab  Result Value Ref Range Status   SARS Coronavirus 2 NEGATIVE NEGATIVE Final    Comment: (NOTE) SARS-CoV-2 target nucleic acids are NOT DETECTED. The SARS-CoV-2 RNA is generally detectable in upper and lower respiratory specimens during the acute phase of infection. Negative results do not preclude SARS-CoV-2 infection, do not rule out co-infections with other pathogens, and should not be used as the sole basis for treatment or other patient management decisions. Negative results must be combined with clinical observations, patient history, and epidemiological information. The expected result is Negative. Fact Sheet for Patients: SugarRoll.be Fact Sheet for Healthcare Providers: https://www.woods-mathews.com/ This test is not yet approved or cleared by the [place] FDA and  has been authorized for detection and/or diagnosis  of SARS-CoV-2 by FDA under an Emergency Use Authorization (EUA). This EUA will remain  in effect (meaning this test can be used) for the duration of the COVID-19 declaration under Section 56 4(b)(1) of the Act, 21 U.S.C. section 360bbb-3(b)(1), unless the authorization is terminated or revoked sooner. Performed at [facility], [place] [address] [place], [place] [address]   Blood Culture (routine x 2)     Status: None (Preliminary result)   Collection Time: 10/01/19 12:11 AM   Specimen: BLOOD RIGHT FOREARM  Result Value Ref Range Status   Specimen Description   Final    BLOOD RIGHT FOREARM Performed at [facility], [place] [address], [place], [place] [address]    Special Requests   Final    BOTTLES DRAWN AEROBIC AND ANAEROBIC Blood Culture results may not be optimal due to an inadequate volume of blood received in culture bottles Performed at [facility] [address] [place], [place] [address]    Culture   Final    NO GROWTH 2 DAYS Performed at [facility] [address], [place], [place] [address]    Report Status PENDING  Incomplete  SARS CORONAVIRUS 2 (TAT 6-24 HRS) Nasopharyngeal Nasopharyngeal Swab     Status: None   Collection Time: 10/01/19 12:13 AM   Specimen: Nasopharyngeal Swab  Result Value Ref Range Status   SARS Coronavirus 2 NEGATIVE NEGATIVE Final    Comment: (NOTE) SARS-CoV-2 target nucleic acids are NOT DETECTED. The SARS-CoV-2 RNA is generally detectable in upper and lower respiratory specimens during the acute phase of infection. Negative results do not preclude SARS-CoV-2 infection, do not rule out co-infections with other pathogens, and should not be used as the sole basis for treatment or other patient management decisions. Negative results must be combined with clinical observations, patient history, and epidemiological information. The expected result is Negative. Fact Sheet for Patients:  SugarRoll.be Fact Sheet for Healthcare Providers: https://www.woods-mathews.com/ This test is not yet approved or cleared by the [place] FDA and  has been authorized for detection and/or diagnosis of SARS-CoV-2 by FDA under an Emergency Use Authorization (EUA). This EUA will remain  in effect (meaning this test can be used) for the duration of the COVID-19 declaration under Section 56 4(b)(1) of the Act, 21 U.S.C. section 360bbb-3(b)(1), unless the authorization is terminated or revoked sooner. Performed at [facility], [place] [address], [place],  [address]   Blood Culture (routine x 2)     Status: None (Preliminary result)   Collection Time: 10/01/19 12:16 AM  Specimen: BLOOD LEFT WRIST  Result Value Ref Range Status   Specimen Description   Final    BLOOD LEFT WRIST Performed at [facility] [address], [place], [place] [address]    Special Requests   Final    BOTTLES DRAWN AEROBIC AND ANAEROBIC Blood Culture adequate volume Performed at [facility] [address] [place], [place] [address]    Culture   Final    NO GROWTH 2 DAYS Performed at [facility] [address], [place], [place] [address]    Report Status PENDING  Incomplete  Urine culture     Status: Abnormal   Collection Time: 10/01/19  8:25 AM   Specimen: In/Out Cath Urine  Result Value Ref Range Status   Specimen Description   Final    IN/OUT CATH URINE Performed at [facility] [address] [place], [place] [address]    Special Requests   Final    NONE Performed at [facility], [place] [address] [place], [place] [address]    Culture (A)  Final    <10,000 COLONIES/mL INSIGNIFICANT GROWTH Performed at [facility] [address], [place], [place] [address]    Report Status 10/02/2019 FINAL  Final         Radiology Studies: No results found.       Scheduled Meds: . atorvastatin  80 mg Oral Daily  . feeding supplement (ENSURE ENLIVE)  237 mL Oral BID BM  . glycopyrrolate  1 mg Oral BID  . metoprolol succinate  25 mg Oral Daily  . multivitamin with minerals  1 tablet Oral Daily  . polyethylene glycol  17 g Oral Daily   Continuous Infusions:   LOS: 0 days     Georgette Shell, MD [facility] If 7PM-7AM, please contact night-coverage [email or web address] Password [email or web address] 10/03/2019, 1:25 PM

## 2019-10-03 NOTE — Progress Notes (Signed)
OT Cancellation Note  Patient Details Name: [name] [name] MRN: [id number] DOB: Jul 11, 1923   Cancelled Treatment:     patient is supposed to d/c today back to snf. Will defer OT evalution  Xitlalli Newhard 10/03/2019, 10:47 AM

## 2019-10-03 NOTE — NC FL2 (Signed)
[place] LEVEL OF CARE SCREENING TOOL     IDENTIFICATION  Patient Name: [name] [name] Birthdate: 1922/12/30 Sex: male Admission Date (Current Location): 09/30/2019  [facility] [place] Number:  Herbalist and Address:  [facility],  [place], [place]      Provider Number: [id number]  Attending Physician Name and Address:  Georgette Shell, MD  Relative Name and Phone Number:       Current Level of Care: Hospital Recommended Level of Care: [place] Prior Approval Number:    Date Approved/Denied:   PASRR Number: [id number]  Discharge Plan: SNF    Current Diagnoses: Patient Active Problem List   Diagnosis Date Noted  . Abdominal pain 10/01/2019  . Multiple rib fractures 10/01/2019  . SIRS (systemic inflammatory response syndrome) ([place]) 10/01/2019  . CKD (chronic kidney disease) stage 3, GFR 30-59 ml/min 10/01/2019  . Kidney laceration, left 09/15/2019  . Acute anterior epistaxis 09/30/2012  . Dehydration 09/30/2012  . Hyperkalemia 09/30/2012  . Hyponatremia 09/30/2012  . HTN (hypertension) 09/30/2012  . A-fib ([place]) 09/30/2012    Orientation RESPIRATION BLADDER Height & Weight     Self, Time, Place    Incontinent Weight: 249 lb (112.9 kg) Height:  6' (182.9 cm)  BEHAVIORAL SYMPTOMS/MOOD NEUROLOGICAL BOWEL NUTRITION STATUS      Incontinent Diet(Regular diet, but pureed (Disphasia diet).)  AMBULATORY STATUS COMMUNICATION OF NEEDS Skin   Extensive Assist Verbally Normal(Moisture associated rash on buttocks)                       Personal Care Assistance Level of Assistance  Bathing, Dressing Bathing Assistance: Limited assistance Feeding assistance: Independent Dressing Assistance: Limited assistance     Functional Limitations Info  Hearing Sight Info: Adequate Hearing Info: Impaired(Hard of hearing, but can communicate) Speech Info: Adequate    SPECIAL CARE FACTORS FREQUENCY   PT (By licensed PT), OT (By licensed OT)     PT Frequency: 5 OT Frequency: 5            Contractures Contractures Info: Not present    Additional Factors Info  Code Status, Allergies Code Status Info: DNR Allergies Info: Penicillin           Current Medications (10/03/2019):  This is the current hospital active medication list Current Facility-Administered Medications  Medication Dose Route Frequency Provider Last Rate Last Dose  . acetaminophen (TYLENOL) tablet 650 mg  650 mg Oral Q6H PRN Etta Quill, DO   650 mg at 10/03/19 1002   Or  . acetaminophen (TYLENOL) suppository 650 mg  650 mg Rectal Q6H PRN Etta Quill, DO      . atorvastatin (LIPITOR) tablet 80 mg  80 mg Oral Daily Jennette Kettle M, DO   80 mg at 10/03/19 1002  . feeding supplement (ENSURE ENLIVE) (ENSURE ENLIVE) liquid 237 mL  237 mL Oral BID BM Jennette Kettle M, DO   237 mL at 10/03/19 1002  . glycopyrrolate (ROBINUL) tablet 1 mg  1 mg Oral BID Jennette Kettle M, DO   1 mg at 10/03/19 1002  . guaiFENesin (MUCINEX) 12 hr tablet 600 mg  600 mg Oral BID PRN Etta Quill, DO      . methocarbamol (ROBAXIN) tablet 500 mg  500 mg Oral Q8H PRN Etta Quill, DO      . metoprolol succinate (TOPROL-XL) 24 hr tablet 25 mg  25 mg Oral Daily Jennette Kettle  M, DO   25 mg at 10/03/19 1002  . morphine 2 MG/ML injection 2-4 mg  2-4 mg Intravenous Q4H PRN Etta Quill, DO      . multivitamin with minerals tablet 1 tablet  1 tablet Oral Daily Jennette Kettle M, DO   1 tablet at 10/03/19 1002  . ondansetron (ZOFRAN) tablet 4 mg  4 mg Oral Q6H PRN Etta Quill, DO       Or  . ondansetron [facility]) injection 4 mg  4 mg Intravenous Q6H PRN Etta Quill, DO      . polyethylene glycol (MIRALAX / GLYCOLAX) packet 17 g  17 g Oral Daily Jennette Kettle M, DO   17 g at 10/03/19 1001  . traMADol (ULTRAM) tablet 50 mg  50 mg Oral Q6H PRN Etta Quill, DO   50 mg at 10/02/19 1108     Discharge  Medications: Please see discharge summary for a list of discharge medications.  Relevant Imaging Results:  Relevant Lab Results:   Additional Information [id number]  Alphonse Guild Aalaysia Liggins, LCSW

## 2019-10-03 NOTE — Progress Notes (Signed)
CSW spoke w/ Ebony Hail at [facility] who stated once the PT note comes in via the hub she can request the auth from [employer] electronically and it will be processed on 11/2.   CSW awaiting PT recommendation.  CSW will continue to follow for D/C needs.  Alphonse Guild. Audery Wassenaar, LCSW, LCAS, CSI Transitions of Care Clinical Social Worker Care Coordination Department Ph: [phone]     ]

## 2019-10-03 NOTE — Progress Notes (Addendum)
Consult request has been received. CSW attempting to follow up at present time.  CSW reviewed chart and sees TOC CSW Note from 10/27:  10/27: CSW spoke with Bryson Ha at [facility] who reports that [employer] authorization is still pending at this time. Patient's decision maker [name] has been updated.   Per chart review, pt D/C'd to [facility] on 10/27 and then on 10/29 pt returned to [facility] ED for abdominal pain and possible respiration, per staff at [facility].  11:08 AM CSW spoke to [place] in admissions at [facility] who stated if pt were agreeable to return then she would need updated PT notes so she cold request another auth from [employer] Scientist, clinical (histocompatibility and immunogenetics)).  Per Ebony Hail there is a separate hall for [place] cases and that they are quarantined and that the workers on that hall are not allowed on the other non-COVID halls, per [employer]  CSW will continue to follow for D/C needs.  Alphonse Guild. Khamari Yousuf, LCSW, LCAS, CSI Transitions of Care Clinical Social Worker Care Coordination Department Ph: [phone]

## 2019-10-03 NOTE — Progress Notes (Signed)
Called [name] Riffey,Social Worker to inquire about pt discharge to SNF, spoke with pt with SW the discharge status, pt deferred final decisions to [place]. SW will contact Mr. Sabra Heck and update concerning discharge to SNF [facility]). SRP RN

## 2019-10-03 NOTE — TOC Initial Note (Signed)
Transition of Care [facility]) - Initial/Assessment Note    Patient Details  Name: [name] [name] MRN: [id number] Date of Birth: 15-Mar-1923  Transition of Care [facility]) CM/SW Contact:    Claudine Mouton, LCSW Phone Number: 10/03/2019, 12:58 PM  Clinical Narrative:     Claudine Mouton, LCSW  Social Worker  [facility] CM/SW  Progress Notes  Addendum  Date of Service:  10/03/2019 12:19 PM             Show:Clear all [x] Manual[x] Template[x] Copied  Added by: [x] Mikle Sternberg, Alphonse Guild, LCSW  [] Hover for details CSW received a call from pt's RN Sophia who stated that after she and another staff member at [facility] clarified for the pt, verbally, the situation the pt then stated he did not want to go back due to [place] situation at [facility].  CSW received a call from the [place] that the [place] had been consulted and that Leadership stated that while the pt's ins auth to [place] is being requested that the CSW send referrals out to other facilties and that once an approval or denial of pt's auth at [facility] is obtained then:  1. If pt has another offer and can go quickly then they can go or  2.  If pt does not have another offer at that time then pt must go back to [place] and that they have the option to transfer from there to another facility if they chose to.  Also, per [facility] Leadership, if pt refuses to leave at time of D/C pt will have to sign paperwork stating he understands that pt will be financially responsible for his hospital stay going forward.  CSW called pt's friend/POA Iran Planas at ph: [phone] whom the pt asked that the CSW call in order to facilitate a correct decision and Mr. Sabra Heck was updated.  Mr. Sabra Heck was agreeable to awaiting the outcome of the CSW (this Probation officer) referring pt out to all [place] and outlying area SNF's by creating an FL-2 and sending referrals out via the hub and provided verbal permission  for the CSW to do so.  Mr. Sabra Heck voiced understanding that if no other facilities are found that are agreeable and able to accept the pt in a timely manner, once the pt's ins Josem Kaufmann is completed then pt will have to return to [place] once ready for D/C.  CSW reiterated to Mr. Sabra Heck that, per Ebony Hail there is a separate hall for [place] cases and that they are quarantined and that the workers on that hall are not allowed on the other non-COVID halls, per [employer]  Mr. Sabra Heck voiced understanding.  RN updated but per Mr. Ammie Ferrier request Mr. Sabra Heck will inform pt so as to not upset the pt.                    Expected Discharge Plan: Skilled Nursing Facility Barriers to Discharge: Insurance Authorization   Patient Goals and CMS Choice Patient states their goals for this hospitalization and ongoing recovery are:: To go to rehab safely.      Expected Discharge Plan and Services Expected Discharge Plan: [place]         Expected Discharge Date: 10/02/19                                    Prior Living Arrangements/Services  Lives with:: Facility Resident Patient language and need for interpreter reviewed:: No Do you feel safe going back to the place where you live?: No      Need for Family Participation in Patient Care: Yes (Comment) Care giver support system in place?: No (comment)      Activities of Daily Living Home Assistive Devices/Equipment: Walker (specify type), Nebulizer, Scales, Blood pressure cuff, Cane (specify quad or straight), Wheelchair(patient has single point cane, front wheeled walker, manual wheelchair and walk-in shower-while at [employer] [facility] [employer], they have necessary equipment for their residents) ADL Screening (condition at time of admission) Patient's cognitive ability adequate to safely complete daily activities?: Yes Is the patient deaf or have difficulty hearing?: Yes(hoh) Does the patient have difficulty  seeing, even when wearing glasses/contacts?: No Does the patient have difficulty concentrating, remembering, or making decisions?: No Patient able to express need for assistance with ADLs?: Yes Does the patient have difficulty dressing or bathing?: Yes Independently performs ADLs?: No Communication: Independent Dressing (OT): Needs assistance Is this a change from baseline?: Pre-admission baseline Grooming: Needs assistance Is this a change from baseline?: Pre-admission baseline Feeding: Needs assistance Is this a change from baseline?: Pre-admission baseline Bathing: Needs assistance Is this a change from baseline?: Pre-admission baseline Toileting: Needs assistance Is this a change from baseline?: Pre-admission baseline In/Out Bed: Needs assistance Is this a change from baseline?: Pre-admission baseline Walks in Home: Needs assistance Is this a change from baseline?: Pre-admission baseline Does the patient have difficulty walking or climbing stairs?: Yes(secondary to weakness) Weakness of Legs: Both Weakness of Arms/Hands: None  Permission Sought/Granted Permission sought to share information with : Facility Art therapist granted to share information with : Yes, Verbal Permission Granted              Emotional Assessment     Affect (typically observed): Accepting, Adaptable, Quiet, Frustrated Orientation: : Oriented to Self, Oriented to Place, Oriented to  Time      Admission diagnosis:  SIRS (systemic inflammatory response syndrome) ([place]) [R65.10] Fall from bed, sequela [W06.XXXS] Rupture of kidney, left, sequela [S37.062S] Closed fracture of multiple ribs of left side, sequela [S22.42XS] Patient Active Problem List   Diagnosis Date Noted  . Abdominal pain 10/01/2019  . Multiple rib fractures 10/01/2019  . SIRS (systemic inflammatory response syndrome) ([place]) 10/01/2019  . CKD (chronic kidney disease) stage 3, GFR 30-59 ml/min 10/01/2019  .  Kidney laceration, left 09/15/2019  . Acute anterior epistaxis 09/30/2012  . Dehydration 09/30/2012  . Hyperkalemia 09/30/2012  . Hyponatremia 09/30/2012  . HTN (hypertension) 09/30/2012  . A-fib ([place]) 09/30/2012   PCP:  [name], MD Pharmacy:   [facility] [place] [address] Phone: [phone] Fax: [phone]     Social Determinants of Health (SDOH) Interventions    Readmission Risk Interventions No flowsheet data found.

## 2019-10-03 NOTE — Progress Notes (Signed)
Pt voiced he does not want to return to [place] due to pt with Covid cases. Pt called Iran Planas to notify him of his decision. Social Worker Attica Riffey updated of current status and will follow on placement. SRP, RN

## 2019-10-03 NOTE — Progress Notes (Addendum)
CSW spoke to the pt's RN who stated pt's friend Iran Planas at ph: [phone] and Terrilee Croak is [phone] is the decision-maker.  Pt presented as not being completely able to comprehend what was being said, although he did say, "I don't want any of that stuff", perhaps referring to [place].  Pt was asked by the RN at the CSW's request, if his son should be updated and pt stated that [name] was given decision-making ability by the pt an the pt's son, per the pt.  11:40 AM   CSW updated Advanced Care Team Leader and asked for guidance on protocol in this case and  CSW awaiting return call from [facility].  CSW will continue to follow for D/C needs.  Alphonse Guild. Caine Barfield, LCSW, LCAS, CSI Transitions of Care Clinical Social Worker Care Coordination Department Ph: [phone]

## 2019-10-03 NOTE — Progress Notes (Signed)
   10/03/19 2053  Level of Consciousness  Level of Consciousness Alert  Pain Assessment  Pain Scale 0-10  Pain Score 0  Multiple Pain Sites No  MEWS Score  MEWS RR 2  MEWS Pulse 0  MEWS Systolic 0  MEWS LOC 0  MEWS Temp 0  MEWS Score 2  MEWS Score Color Yellow  MEWS Assessment  Is this an acute change? No

## 2019-10-03 NOTE — Progress Notes (Signed)
CSW reviewed chart and sees pt has a PASRR:  [id number]  CSW will continue to follow for D/C needs.  Alphonse Guild. Ruthann Angulo, LCSW, LCAS, CSI Transitions of Care Clinical Social Worker Care Coordination Department Ph: [phone]

## 2019-10-03 NOTE — Progress Notes (Signed)
CSW awaiting return PT recommendation to send to [place] so they can begin ins [place].  CSW will continue to follow for D/C needs.  Alphonse Guild. Rune Mendez, LCSW, LCAS, CSI Transitions of Care Clinical Social Worker Care Coordination Department Ph: [phone]

## 2019-10-03 NOTE — Evaluation (Signed)
Physical Therapy Evaluation Patient Details Name: [name] [name] MRN: [id number] DOB: 12/03/1922 Today's Date: 10/03/2019   History of Present Illness  85 year old gentleman with recent extensive hospitalization at [facility] d/t fall at home  with trauma to  left kidney laceration, left-sided rib fractures 7-11, chronic aspiration who was brought to the hospital with concern about abdominal pain.  CAT scan of the abdomen pelvis negative. PMH: HTN, afib, colon CA  Clinical Impression  Pt admitted with above diagnosis.  Pt  Weaker than on last admission after fall resulting in trauma. Unable to come to standing with +2 assist  to utilize stedy. Pt desats to 74% on 2L O2 coming to sit on EOB recovered withing <1 min, otherwise maintaining sats 88-96% on 2L during PT eval. Recommend SNF post acute.  Will continue to follow   Pt currently with functional limitations due to the deficits listed below (see PT Problem List). Pt will benefit from skilled PT to increase their independence and safety with mobility to allow discharge to the venue listed below.       Follow Up Recommendations SNF    Equipment Recommendations  None recommended by PT    Recommendations for Other Services       Precautions / Restrictions Precautions Precautions: Fall Precaution Comments: monitor  O2 and HR      Mobility  Bed Mobility Overal bed mobility: Needs Assistance Bed Mobility: Rolling;Sidelying to Sit Rolling: Min assist Sidelying to sit: HOB elevated;Mod assist   Sit to supine: Max assist;+2 for physical assistance   General bed mobility comments: cues for sequencing and use of rail to roll toward L side; assist to bring bilat LE to EOB and to elevate trunk into sitting; heavy assist with trunk and bil LEs into supine, pt able to assist with scooting  up in bed in supine  Transfers Overall transfer level: Needs assistance   Transfers: Sit to/from Stand Sit to Stand: +2 physical assistance;Total assist;From  elevated surface         General transfer comment: 3 attempts to stand to stedy from elevated bed , pt required +2 total assist and was unable to extend knees and hips to allow stedy pads to be lowered  Ambulation/Gait             General Gait Details: unable to stand  Stairs            Wheelchair Mobility    Modified Rankin (Stroke Patients Only)       Balance Overall balance assessment: Needs assistance Sitting-balance support: Feet supported;Bilateral upper extremity supported Sitting balance-Leahy Scale: Poor Sitting balance - Comments: pt is reliant on UEs to maintain sitting       Standing balance comment: Pt unable to achieve erect standing                              Pertinent Vitals/Pain Pain Assessment: Faces Faces Pain Scale: Hurts even more Pain Location: ribs with movement Pain Descriptors / Indicators: Guarding;Grimacing;Sore Pain Intervention(s): Monitored during session;Repositioned    Home Living Family/patient expects to be discharged to:: Skilled nursing facility                      Prior Function     Gait / Transfers Assistance Needed: Uses RW for ambulation, reports difficulty with this a few days prior to fall. "I can't even walkt o my mail box due to weakness."  ADL's /  Homemaking Assistance Needed: Does his own ADLs. Reports he has 2 caregivers that call everyday and one comes twice/week and the other comes when he calls. They assist with shopping and IADLs.  Comments: Reports 3 falls in last 6 months--per previous PT notes (recent admission less than 1 wk ago)     Hand Dominance        Extremity/Trunk Assessment   Upper Extremity Assessment Upper Extremity Assessment: Generalized weakness    Lower Extremity Assessment Lower Extremity Assessment: Generalized weakness;RLE deficits/detail RLE Deficits / Details: bil ankles grossly WFL, knee extension 2+/5, knee flexion 2+/5, hips grossly 2+/5     Cervical / Trunk Assessment Cervical / Trunk Assessment: Kyphotic  Communication   Communication: HOH  Cognition Arousal/Alertness: Awake/alert Behavior During Therapy: WFL for tasks assessed/performed Overall Cognitive Status: Within Functional Limits for tasks assessed                                 General Comments: slow processing at times      General Comments      Exercises     Assessment/Plan    PT Assessment Patient needs continued PT services  PT Problem List Decreased strength;Decreased mobility;Decreased balance;Cardiopulmonary status limiting activity;Decreased activity tolerance;Pain;Decreased safety awareness       PT Treatment Interventions Therapeutic activities;Gait training;Therapeutic exercise;Patient/family education;Balance training;Functional mobility training;DME instruction    PT Goals (Current goals can be found in the Care Plan section)  Acute Rehab PT Goals Patient Stated Goal: go to a different rehab PT Goal Formulation: With patient Time For Goal Achievement: 10/17/19 Potential to Achieve Goals: Fair    Frequency Min 2X/week   Barriers to discharge Decreased caregiver support      Co-evaluation               AM-PAC PT "6 Clicks" Mobility  Outcome Measure Help needed turning from your back to your side while in a flat bed without using bedrails?: A Little Help needed moving from lying on your back to sitting on the side of a flat bed without using bedrails?: A Lot Help needed moving to and from a bed to a chair (including a wheelchair)?: Total Help needed standing up from a chair using your arms (e.g., wheelchair or bedside chair)?: Total Help needed to walk in hospital room?: Total Help needed climbing 3-5 steps with a railing? : Total 6 Click Score: 9    End of Session Equipment Utilized During Treatment: Gait belt Activity Tolerance: Patient limited by fatigue Patient left: in bed;with call bell/phone  within reach;with bed alarm set   PT Visit Diagnosis: Muscle weakness (generalized) (M62.81);Difficulty in walking, not elsewhere classified (R26.2);Unsteadiness on feet (R26.81);Repeated falls (R29.6)    Time: 1310-1331 PT Time Calculation (min) (ACUTE ONLY): 21 min   Charges:   PT Evaluation $PT Eval Moderate Complexity: 1 Mod          Kenyon Ana, PT  Pager: [phone] Acute Rehab Dept [facility]): [phone]   10/03/2019   [facility] 10/03/2019, 4:18 PM

## 2019-10-03 NOTE — Progress Notes (Addendum)
CSW received a call from pt's RN Sophia who stated that after she and another staff member at [facility] clarified for the pt, verbally, the situation the pt then stated he did not want to go back due to [place] situation at [facility].  CSW received a call from the [place] that the [place] had been consulted and that Leadership stated that while the pt's ins auth to [place] is being requested that the CSW send referrals out to other facilties and that once an approval or denial of pt's auth at [facility] is obtained then:  1. If pt has another offer and can go quickly then they can go or  2.  If pt does not have another offer at that time then pt must go back to [place] and that they have the option to transfer from there to another facility if they chose to.  Also, per [facility] Leadership, if pt refuses to leave at time of D/C pt will have to sign paperwork stating he understands that pt will be financially responsible for his hospital stay going forward.  CSW called pt's friend/POA Iran Planas at ph: [phone] whom the pt asked that the CSW call in order to facilitate a correct decision and Mr. Sabra Heck was updated.  Mr. Sabra Heck was agreeable to awaiting the outcome of the CSW (this Probation officer) referring pt out to all [place] and outlying area SNF's by creating an FL-2 and sending referrals out via the hub and provided verbal permission for the CSW to do so.  Mr. Sabra Heck voiced understanding that if no other facilities are found that are agreeable and able to accept the pt in a timely manner, once the pt's ins Josem Kaufmann is completed then pt will have to return to [place] once ready for D/C.  CSW reiterated to Mr. Sabra Heck that, per Ebony Hail there is a separate hall for [place] cases and that they are quarantined and that the workers on that hall are not allowed on the other non-COVID halls, per [employer]  Mr. Sabra Heck voiced understanding.  RN  updated but per Mr. Ammie Ferrier request Mr. Sabra Heck will inform pt so as to not upset the pt.  CSW will continue to follow for D/C needs.  Alphonse Guild. Tashanda Fuhrer, LCSW, LCAS, CSI Transitions of Care Clinical Social Worker Care Coordination Department Ph: [phone]          .

## 2019-10-03 NOTE — Progress Notes (Signed)
   10/02/19 1859  Vitals  Temp 98.4 F (36.9 C)  Temp Source Oral  BP 111/63  MAP (mmHg) 75  Pulse Rate 81  Resp (!) 27  Oxygen Therapy  SpO2 100 %  O2 Device Nasal Cannula  O2 Flow Rate (L/min) 2 L/min  MEWS Score  MEWS RR 2  MEWS Pulse 0  MEWS Systolic 0  MEWS LOC 0  MEWS Temp 0  MEWS Score 2  MEWS Score Color Yellow  MEWS Assessment  Is this an acute change? No

## 2019-10-04 DIAGNOSIS — R1084 Generalized abdominal pain: Secondary | ICD-10-CM | POA: Diagnosis not present

## 2019-10-04 DIAGNOSIS — N179 Acute kidney failure, unspecified: Secondary | ICD-10-CM | POA: Diagnosis not present

## 2019-10-04 DIAGNOSIS — R6510 Systemic inflammatory response syndrome (SIRS) of non-infectious origin without acute organ dysfunction: Secondary | ICD-10-CM | POA: Diagnosis not present

## 2019-10-04 DIAGNOSIS — R600 Localized edema: Secondary | ICD-10-CM

## 2019-10-04 DIAGNOSIS — I482 Chronic atrial fibrillation, unspecified: Secondary | ICD-10-CM | POA: Diagnosis not present

## 2019-10-04 LAB — BASIC METABOLIC PANEL
Anion gap: 7 (ref 5–15)
BUN: 33 mg/dL — ABNORMAL HIGH (ref 8–23)
CO2: 27 mmol/L (ref 22–32)
Calcium: 8.2 mg/dL — ABNORMAL LOW (ref 8.9–10.3)
Chloride: 101 mmol/L (ref 98–111)
Creatinine, Ser: 1.11 mg/dL (ref 0.61–1.24)
GFR calc Af Amer: >60 mL/min (ref >60)
GFR calc non Af Amer: 57 mL/min — ABNORMAL LOW (ref >60)
Glucose, Bld: 142 mg/dL — ABNORMAL HIGH (ref 70–99)
Potassium: 4 mmol/L (ref 3.5–5.1)
Sodium: 135 mmol/L (ref 135–145)

## 2019-10-04 MED ORDER — RESOURCE THICKENUP CLEAR PO POWD
ORAL | Status: DC | PRN
  Filled 2019-10-04: qty 125

## 2019-10-04 NOTE — Evaluation (Signed)
Occupational Therapy Evaluation Patient Details Name: [name] [name] MRN: [id number] DOB: October 25, 1923 Today's Date: 10/04/2019    History of Present Illness 85 year old gentleman with recent extensive hospitalization at [facility] d/t fall at home  with trauma to  left kidney laceration, left-sided rib fractures 7-11, chronic aspiration who was brought to the hospital with concern about abdominal pain.  CAT scan of the abdomen pelvis negative. PMH: HTN, afib, colon CA   Clinical Impression   Pt admitted with SIRS. Pt currently with functional limitations due to the deficits listed below (see OT Problem List).  Pt will benefit from skilled OT to increase their safety and independence with ADL and functional mobility for ADL to facilitate discharge to venue listed below.   Pt has legitimate concerns regarding his care at the SNF in which he was a resident.  Pt wants to go to a different rehab.  Pt reports he has to scream out for A, there was not call bell and not bed rails.      Follow Up Recommendations  SNF;Supervision/Assistance - 24 hour    Equipment Recommendations  Other (comment)(TBD at next venue of care)    Recommendations for Other Services       Precautions / Restrictions Precautions Precautions: Fall Precaution Comments: monitor  O2 and HR Restrictions Weight Bearing Restrictions: No      Mobility Bed Mobility Overal bed mobility: Needs Assistance Bed Mobility: Rolling;Sidelying to Sit;Sit to Supine Rolling: Min assist Sidelying to sit: HOB elevated;Mod assist   Sit to supine: Max assist;+2 for physical assistance   General bed mobility comments: increased time and VC for sequencing activity.  Transfers Overall transfer level: Needs assistance     Sit to Stand: +2 physical assistance;Total assist;From elevated surface         General transfer comment: did not perform    Balance Overall balance assessment: Needs assistance Sitting-balance support: Feet  supported;Bilateral upper extremity supported Sitting balance-Leahy Scale: Poor Sitting balance - Comments: pt is reliant on UEs to maintain sitting                                   ADL either performed or assessed with clinical judgement   ADL Overall ADL's : Needs assistance/impaired Eating/Feeding: Set up;Sitting   Grooming: Oral care;Set up;Supervision/safety;Sitting;Wash/dry face               Lower Body Dressing: Sit to/from stand               Functional mobility during ADLs: (STEDY ) General ADL Comments: pt sat EOB with OT.  Pt declined getting to chair.  Pt very concerned about DC plan.Pt does not feel he was cared for at the SNF.  Pt doesnt want to return to [employer].  OT shared with RN CM.     Vision Baseline Vision/History: Macular Degeneration Patient Visual Report: No change from baseline              Pertinent Vitals/Pain Pain Assessment: 0-10 Pain Score: 2  Pain Location: lower back Pain Descriptors / Indicators: Sore     Hand Dominance     Extremity/Trunk Assessment Upper Extremity Assessment Upper Extremity Assessment: Generalized weakness RUE Deficits / Details: limited FF to 90 degrees (reports recent fall on shoulder)            Communication Communication Communication: HOH   Cognition Arousal/Alertness: Awake/alert Behavior During Therapy: WFL for tasks assessed/performed  Overall Cognitive Status: Within Functional Limits for tasks assessed                                 General Comments: slow processing at times   General Comments               Home Living Family/patient expects to be discharged to:: Skilled nursing facility                                        Prior Functioning/Environment Level of Independence: Needs assistance    ADL's / Homemaking Assistance Needed: Pt reports not being able to stand at SNF.            OT Problem List: Decreased  strength;Decreased activity tolerance;Impaired balance (sitting and/or standing);Decreased range of motion;Decreased coordination;Cardiopulmonary status limiting activity;Decreased knowledge of precautions;Decreased knowledge of use of DME or AE;Obesity;Impaired UE functional use;Pain;Increased edema      OT Treatment/Interventions: Self-care/ADL training;DME and/or AE instruction;Therapeutic activities;Balance training;Patient/family education    OT Goals(Current goals can be found in the care plan section) Acute Rehab OT Goals Patient Stated Goal: go to a different rehab ADL Goals Pt Will Perform Grooming: standing Pt Will Perform Lower Body Bathing: with mod assist;sit to/from stand;sitting/lateral leans Pt Will Perform Lower Body Dressing: with mod assist;sit to/from stand;sitting/lateral leans Pt Will Transfer to Toilet: with mod assist;bedside commode Pt Will Perform Toileting - Clothing Manipulation and hygiene: with mod assist;sitting/lateral leans;sit to/from stand  OT Frequency: Min 2X/week   Barriers to D/C: Decreased caregiver support             AM-PAC OT "6 Clicks" Daily Activity     Outcome Measure Help from another person eating meals?: A Little Help from another person taking care of personal grooming?: A Little Help from another person toileting, which includes using toliet, bedpan, or urinal?: Total Help from another person bathing (including washing, rinsing, drying)?: A Lot Help from another person to put on and taking off regular upper body clothing?: A Lot Help from another person to put on and taking off regular lower body clothing?: Total 6 Click Score: 12   End of Session Equipment Utilized During Treatment: Oxygen(2L) Nurse Communication: Mobility status  Activity Tolerance: Patient limited by fatigue Patient left: in bed;with call bell/phone within reach;with bed alarm set  OT Visit Diagnosis: Other abnormalities of gait and mobility (R26.89);Muscle  weakness (generalized) (M62.81);Pain;History of falling (Z91.81) Pain - Right/Left: Left Pain - part of body: (ribs)                Time: [id number] OT Time Calculation (min): 26 min Charges:  OT General Charges $OT Visit: 1 Visit OT Evaluation $OT Eval Moderate Complexity: 1 Mod OT Treatments $Self Care/Home Management : 8-22 mins  Kari Baars, OT Acute Rehabilitation Services [phone] Office- [place], Edwena Felty D 10/04/2019, 11:08 AM

## 2019-10-04 NOTE — TOC Progression Note (Signed)
Transition of Care [facility]) - Progression Note    Patient Details  Name: [name] [name] MRN: [id number] Date of Birth: 03/14/1923  Transition of Care [facility]) CM/SW Contact  Purcell Mouton, RN Phone Number: 10/04/2019, 10:10 AM  Clinical Narrative:     Pt selected [facility] in [facility]. A call was made to Clyde at [facility], who will get authorization from [facility].   Expected Discharge Plan: Skilled Nursing Facility Barriers to Discharge: Insurance Authorization  Expected Discharge Plan and Services Expected Discharge Plan: [place]         Expected Discharge Date: 10/04/19                                     Social Determinants of Health (SDOH) Interventions    Readmission Risk Interventions No flowsheet data found.

## 2019-10-04 NOTE — Progress Notes (Signed)
PROGRESS NOTE    [name] [name]  [id number]  DOB: 10-18-1923  DOA: 09/30/2019 PCP: [name], MD  Brief Narrative:  85 y.o.malewith medical history significant for HTN,A.Fib off anticoagulants following recent admission from 10/14-10/27 to Trauma service after fall at SNF resulting in 3 rib fx, a L kidney laceration requiring embolization/repair and acute blood loss anemia. Ultimately he was transition to SNF rehab. Patient presented to the ED on 10/30 with c/o diffuse abd pain and fever on 10/29. ED Course:BP in ED is running low ([id number] systolic), Satting [id number] on RA but tachypneic,Temp 99.4,WBC 12k. Creat 1.44 ( 1.3 on last discharge) . Lactate nl, procalcitonin nl. CT abd pelvis shows stable L perirenal hematoma, no active bleed, mass effect on L kidney with decreased perfusion. Also shows fractures of L 9th-11th ribs, bilateral small pleural effusions L>R..In the emergency room, patient felt to have SIRS, he was started on sepsis pathway, 1 L IV fluid bolus, Rocephin and vancomycin were given. COVID-19 was negative. Hospital course: Admitted for observation and pain control/SIRS work up.  Extensive investigation did not reveal any evidence of infection.  Lactic acid/Procalcitonin resulted normal. He was monitored off antibiotics after initial dose of antibiotics. Final urine cultures and blood cultures no growth so far. Probably initial hypotension related to dehydration and poor oral intake.Sepsis ruled out.  Subjective:  Patient sitting in bed, no acute distress.  He states he does not want to go back to [place].  Noted to be on 2 L O2 and saturating well.  He states he was using 2 liters O2 at skilled nursing facility as well.  Denies any abdominal pain now.  Eating okay.   Objective: Vitals:   10/04/19 0101 10/04/19 0455 10/04/19 0846 10/04/19 1324  BP: 126/76 115/73 119/66 115/68  Pulse: 98 93 85 98  Resp: (!) 28 (!) 28 (!) 22 (!) 21  Temp: 98.3 F (36.8 C)  (!) 97.3 F (36.3 C) 98.4 F (36.9 C) 98.4 F (36.9 C)  TempSrc: Oral Oral Oral Oral  SpO2: 100% 98% 99% 100%  Weight:      Height:        Intake/Output Summary (Last 24 hours) at 10/04/2019 1516 Last data filed at 10/04/2019 0600 Gross per 24 hour  Intake 180 ml  Output -  Net 180 ml   Filed Weights   09/30/19 2357  Weight: 112.9 kg    Physical Examination:  General exam: Appears calm and comfortable  Respiratory system: Decreased breath sounds at bases but otherwise clear to auscultation. Respiratory effort normal. Cardiovascular system: S1 & S2 heard, RRR. No JVD, murmurs, rubs, gallops or clicks. No pedal edema. Gastrointestinal system: Abdomen is nondistended, soft and nontender. No organomegaly or masses felt. Normal bowel sounds heard. Central nervous system: Alert and oriented. No focal neurological deficits. Extremities: Symmetric 5 x 5 power.  Bilateral trace to 1+ edema right greater than left (chronic per patient) Skin: No rashes, lesions or ulcers Psychiatry: Judgement and insight appear normal. Mood & affect appropriate.     Data Reviewed: I have personally reviewed following labs and imaging studies  CBC: Recent Labs  Lab 10/01/19 0011 10/01/19 0838 10/01/19 0959 10/02/19 0450  WBC 12.3* 9.1 10.9* 10.1  NEUTROABS 9.1*  --   --  7.6  HGB 11.0* 11.0* 10.2* 11.4*  HCT 34.6* 34.9* 32.9* 37.0*  MCV 100.3* 102.6* 103.1* 103.1*  PLT 358 291 330 0000000   Basic Metabolic Panel: Recent Labs  Lab 10/01/19 0011 10/01/19 0838 10/01/19  [id number] 10/02/19 0450  NA 132* 138 136 135  K 4.2 2.6* 3.3* 4.2  CL 97* 114* 105 101  CO2 26 19* 24 25  GLUCOSE 114* 81 101* 120*  BUN 46* 32* 39* 44*  CREATININE 1.44* 0.85 1.08 1.23  CALCIUM 8.2* 5.4* 6.8* 8.5*  MG  --   --   --  2.1   GFR: Estimated Creatinine Clearance: 47.6 mL/min (by C-G formula based on SCr of 1.23 mg/dL). Liver Function Tests: Recent Labs  Lab 10/01/19 0011  AST 49*  ALT 32  ALKPHOS 76   BILITOT 1.6*  PROT 5.8*  ALBUMIN 2.4*   No results for input(s): LIPASE, AMYLASE in the last 168 hours. No results for input(s): AMMONIA in the last 168 hours. Coagulation Profile: Recent Labs  Lab 10/01/19 0011  INR 1.1   Cardiac Enzymes: No results for input(s): CKTOTAL, CKMB, CKMBINDEX, TROPONINI in the last 168 hours. BNP (last 3 results) No results for input(s): PROBNP in the last 8760 hours. HbA1C: No results for input(s): HGBA1C in the last 72 hours. CBG: No results for input(s): GLUCAP in the last 168 hours. Lipid Profile: No results for input(s): CHOL, HDL, LDLCALC, TRIG, CHOLHDL, LDLDIRECT in the last 72 hours. Thyroid Function Tests: No results for input(s): TSH, T4TOTAL, FREET4, T3FREE, THYROIDAB in the last 72 hours. Anemia Panel: No results for input(s): VITAMINB12, FOLATE, FERRITIN, TIBC, IRON, RETICCTPCT in the last 72 hours. Sepsis Labs: Recent Labs  Lab 10/01/19 0011  PROCALCITON <0.10  LATICACIDVEN 1.2    Recent Results (from the past 240 hour(s))  SARS CORONAVIRUS 2 (TAT 6-24 HRS) Nasopharyngeal Nasopharyngeal Swab     Status: None   Collection Time: 09/27/19 11:22 AM   Specimen: Nasopharyngeal Swab  Result Value Ref Range Status   SARS Coronavirus 2 NEGATIVE NEGATIVE Final    Comment: (NOTE) SARS-CoV-2 target nucleic acids are NOT DETECTED. The SARS-CoV-2 RNA is generally detectable in upper and lower respiratory specimens during the acute phase of infection. Negative results do not preclude SARS-CoV-2 infection, do not rule out co-infections with other pathogens, and should not be used as the sole basis for treatment or other patient management decisions. Negative results must be combined with clinical observations, patient history, and epidemiological information. The expected result is Negative. Fact Sheet for Patients: SugarRoll.be Fact Sheet for Healthcare Providers: https://www.woods-mathews.com/  This test is not yet approved or cleared by the [place] FDA and  has been authorized for detection and/or diagnosis of SARS-CoV-2 by FDA under an Emergency Use Authorization (EUA). This EUA will remain  in effect (meaning this test can be used) for the duration of the COVID-19 declaration under Section 56 4(b)(1) of the Act, 21 U.S.C. section 360bbb-3(b)(1), unless the authorization is terminated or revoked sooner. Performed at [facility], [place] [address] [place], [place] [address]   Blood Culture (routine x 2)     Status: None (Preliminary result)   Collection Time: 10/01/19 12:11 AM   Specimen: BLOOD RIGHT FOREARM  Result Value Ref Range Status   Specimen Description   Final    BLOOD RIGHT FOREARM Performed at [facility], [place] [address], [place], [place] [address]    Special Requests   Final    BOTTLES DRAWN AEROBIC AND ANAEROBIC Blood Culture results may not be optimal due to an inadequate volume of blood received in culture bottles Performed at [facility] [address] [place],  [address]    Culture  Final    NO GROWTH 3 DAYS Performed at [facility], [place] [address], [place], [place] [address]    Report Status PENDING  Incomplete  SARS CORONAVIRUS 2 (TAT 6-24 HRS) Nasopharyngeal Nasopharyngeal Swab     Status: None   Collection Time: 10/01/19 12:13 AM   Specimen: Nasopharyngeal Swab  Result Value Ref Range Status   SARS Coronavirus 2 NEGATIVE NEGATIVE Final    Comment: (NOTE) SARS-CoV-2 target nucleic acids are NOT DETECTED. The SARS-CoV-2 RNA is generally detectable in upper and lower respiratory specimens during the acute phase of infection. Negative results do not preclude SARS-CoV-2 infection, do not rule out co-infections with other pathogens, and should not be used as the sole basis for treatment or other patient management decisions. Negative results must be combined with clinical observations, patient  history, and epidemiological information. The expected result is Negative. Fact Sheet for Patients: SugarRoll.be Fact Sheet for Healthcare Providers: https://www.woods-mathews.com/ This test is not yet approved or cleared by the [place] FDA and  has been authorized for detection and/or diagnosis of SARS-CoV-2 by FDA under an Emergency Use Authorization (EUA). This EUA will remain  in effect (meaning this test can be used) for the duration of the COVID-19 declaration under Section 56 4(b)(1) of the Act, 21 U.S.C. section 360bbb-3(b)(1), unless the authorization is terminated or revoked sooner. Performed at [facility], [place] [address] [place], [place] [address]   Blood Culture (routine x 2)     Status: None (Preliminary result)   Collection Time: 10/01/19 12:16 AM   Specimen: BLOOD LEFT WRIST  Result Value Ref Range Status   Specimen Description   Final    BLOOD LEFT WRIST Performed at [facility] [address] [place], [place] [address]    Special Requests   Final    BOTTLES DRAWN AEROBIC AND ANAEROBIC Blood Culture adequate volume Performed at [facility] [address], [place], [place] [address]    Culture   Final    NO GROWTH 3 DAYS Performed at [facility], [place] [address] [place], [place] [address]    Report Status PENDING  Incomplete  Urine culture     Status: Abnormal   Collection Time: 10/01/19  8:25 AM   Specimen: In/Out Cath Urine  Result Value Ref Range Status   Specimen Description   Final    IN/OUT CATH URINE Performed at [facility] [address], [place], [place] [address]    Special Requests   Final    NONE Performed at [facility], [place] [address] [place], [place] [address]    Culture (A)  Final    <10,000 COLONIES/mL INSIGNIFICANT GROWTH Performed at [facility] [address] [place], [place] [address]     Report Status 10/02/2019 FINAL  Final      Radiology Studies: No results found.      Scheduled Meds: . atorvastatin  80 mg Oral Daily  . feeding supplement (ENSURE ENLIVE)  237 mL Oral BID BM  . glycopyrrolate  1 mg Oral BID  . metoprolol succinate  25 mg Oral Daily  . multivitamin with minerals  1 tablet Oral Daily  . polyethylene glycol  17 g Oral Daily   Continuous Infusions:  Assessment & Plan:   Abdominal pain : W/U so far unremarkable with repeat CT showing stable findings. He has been in the hospital for more than 48 hours and has not  needed any pain medications.   SIRS : POA. Infectious w/u unremarkable. Sepsis ruled out. Probably initial hypotension related to dehydration and poor oral intake. Now improved  Dysphagia/Aspiration risk: He was seen by speech therapy in the past, had a speech evaluation and modified barium swallow. Patient aspirates on all consistencies. Patient is not a good candidate for artificial feeding and he does not want to eat. Patient however wants to eat as much he likes. Patient will aspirate, so he will need supervised feeding and all dysphagia precautions. He has agreed on dysphagia 2 diet with nectar thick liquid.  Hypertension: Presenting with low blood pressures and dehydration. He will continue metoprolol for heart rate control, however discontinued other antihypertensives.   Subacute hypoxic respiratory failure: Likely due to rib fractures/poor inspiratory effort/atelectasis/chronic elevated left hemidiaphragm.  Patient states he was using 2 L O2 at nursing facility as well.  He is currently saturating at 100% on 2 L.  Tapered to 1 L while I was in the room.  Decreased breath sounds at bases on lung exam.  Chest x-ray on 10/30 reported elevation of the left hemidiaphragm with left lung base atelectasis similar to prior radiograph.  CT abdomen showed small bilateral pleural effusions. Incentive spirometry and taper O2 to off as  tolerated.   AKI: Patient had elevated creatinine with peak of 2.5 on 10/17 (last admission) and improved to 1.29 at the time of last discharge 10/26.  Patient presented now with creatinine of 1.44 which improved to 1.0 on 10/30--> 1.2 on 10/31.  Repeat labs today.  Chronic leg edema: Patient states mostly positional and tends to swell when he is up on his feet.  Likely due to venous stasis.  Could also be related to hypoalbuminemia as patient also has small effusions on CT chest.  Will order support stockings.  Will avoid diuretics in concern for AKI and recent admission.  Goals of care: Seen by Palliative care and recommended transfer back to SNF rehab with ultimate goal to go back home. Hard of hearing but oriented. Full code.    DVT prophylaxis: SCDs.  Will order TED hose for chronic leg swellings Code Status: Full code Family / Patient Communication: Discussed with patient Disposition Plan: new SNF rehab    LOS: 0 days    Time spent: 35 minutes   Guilford Shi, MD [facility] Pager [phone]  If 7PM-7AM, please contact night-coverage [email or web address] Password [facility] 10/04/2019, 3:16 PM

## 2019-10-05 DIAGNOSIS — R6510 Systemic inflammatory response syndrome (SIRS) of non-infectious origin without acute organ dysfunction: Secondary | ICD-10-CM | POA: Diagnosis not present

## 2019-10-05 DIAGNOSIS — I482 Chronic atrial fibrillation, unspecified: Secondary | ICD-10-CM | POA: Diagnosis not present

## 2019-10-05 DIAGNOSIS — E440 Moderate protein-calorie malnutrition: Secondary | ICD-10-CM

## 2019-10-05 DIAGNOSIS — R1084 Generalized abdominal pain: Secondary | ICD-10-CM | POA: Diagnosis not present

## 2019-10-05 DIAGNOSIS — I10 Essential (primary) hypertension: Secondary | ICD-10-CM | POA: Diagnosis not present

## 2019-10-05 DIAGNOSIS — E860 Dehydration: Secondary | ICD-10-CM

## 2019-10-05 LAB — SARS CORONAVIRUS 2 (TAT 6-24 HRS): SARS Coronavirus 2: NEGATIVE

## 2019-10-05 NOTE — Progress Notes (Signed)
PROGRESS NOTE    [name] [name]  [id number]  DOB: 05-Feb-1923  DOA: 09/30/2019 PCP: [name], MD  Brief Narrative:  85 y.o.malewith medical history significant for HTN,A.Fib off anticoagulants following recent admission from 10/14-10/27 to Trauma service after fall at SNF resulting in 3 rib fx, a L kidney laceration requiring embolization/repair and acute blood loss anemia. Ultimately he was transition to SNF rehab. Patient presented to the ED on 10/30 with c/o diffuse abd pain and fever on 10/29. ED Course:BP in ED is running low ([id number] systolic), Satting [id number] on RA but tachypneic,Temp 99.4,WBC 12k. Creat 1.44 ( 1.3 on last discharge) . Lactate nl, procalcitonin nl. CT abd pelvis shows stable L perirenal hematoma, no active bleed, mass effect on L kidney with decreased perfusion. Also shows fractures of L 9th-11th ribs, bilateral small pleural effusions L>R..In the emergency room, patient felt to have SIRS, he was started on sepsis pathway, 1 L IV fluid bolus, Rocephin and vancomycin were given. COVID-19 was negative. Hospital course: Admitted for observation and pain control/SIRS work up.  Extensive investigation did not reveal any evidence of infection.  Lactic acid/Procalcitonin resulted normal. He was monitored off antibiotics after initial dose of antibiotics. Final urine cultures and blood cultures no growth so far. Probably initial hypotension related to dehydration and poor oral intake.Sepsis ruled out.Noted to be on 2 L O2 and saturating well.  He states he was using 2 liters O2 at skilled nursing facility as well. Pt evaluated patient and recommended return to SNF rehab. Patient states he does not want to go back to [place].   Subjective:  Patient resting comfortably and talking to son on the phone.Denies any abdominal pain now.  Eating okay. Reduced O2 to 1 lit yesterday. Bedside pulsoximeter showing 100% saturation even with dislocated [place] to his cheek.     Objective: Vitals:   10/04/19 0846 10/04/19 1324 10/04/19 2043 10/05/19 0430  BP: 119/66 115/68 112/83 116/71  Pulse: 85 98 74 95  Resp: (!) 22 (!) 21 (!) 22 (!) 22  Temp: 98.4 F (36.9 C) 98.4 F (36.9 C) 97.6 F (36.4 C) 98.1 F (36.7 C)  TempSrc: Oral Oral Oral Oral  SpO2: 99% 100% (!) 82% 98%  Weight:      Height:        Intake/Output Summary (Last 24 hours) at 10/05/2019 1144 Last data filed at 10/05/2019 1011 Gross per 24 hour  Intake 220 ml  Output --  Net 220 ml   Filed Weights   09/30/19 2357  Weight: 112.9 kg    Physical Examination:  General exam: Appears calm and comfortable  Respiratory system: Decreased breath sounds at bases but otherwise clear to auscultation. Respiratory effort normal. Cardiovascular system: S1 & S2 heard, RRR. No JVD, murmurs, rubs, gallops or clicks. No pedal edema. Gastrointestinal system: Abdomen is nondistended, soft and nontender. No organomegaly or masses felt. Normal bowel sounds heard. Central nervous system: Alert and oriented. No focal neurological deficits. Extremities: Symmetric 5 x 5 power.  Bilateral trace to 1+ edema right greater than left (chronic per patient) Skin: No rashes, lesions or ulcers Psychiatry: Judgement and insight appear normal. Mood & affect appropriate.     Data Reviewed: I have personally reviewed following labs and imaging studies  CBC: Recent Labs  Lab 10/01/19 0011 10/01/19 0838 10/01/19 0959 10/02/19 0450  WBC 12.3* 9.1 10.9* 10.1  NEUTROABS 9.1*  --   --  7.6  HGB 11.0* 11.0* 10.2* 11.4*  HCT 34.6* 34.9* 32.9*  37.0*  MCV 100.3* 102.6* 103.1* 103.1*  PLT 358 291 330 0000000   Basic Metabolic Panel: Recent Labs  Lab 10/01/19 0011 10/01/19 0838 10/01/19 0959 10/02/19 0450 10/04/19 1609  NA 132* 138 136 135 135  K 4.2 2.6* 3.3* 4.2 4.0  CL 97* 114* 105 101 101  CO2 26 19* 24 25 27   GLUCOSE 114* 81 101* 120* 142*  BUN 46* 32* 39* 44* 33*  CREATININE 1.44* 0.85 1.08 1.23 1.11   CALCIUM 8.2* 5.4* 6.8* 8.5* 8.2*  MG  --   --   --  2.1  --    GFR: Estimated Creatinine Clearance: 52.8 mL/min (by C-G formula based on SCr of 1.11 mg/dL). Liver Function Tests: Recent Labs  Lab 10/01/19 0011  AST 49*  ALT 32  ALKPHOS 76  BILITOT 1.6*  PROT 5.8*  ALBUMIN 2.4*   No results for input(s): LIPASE, AMYLASE in the last 168 hours. No results for input(s): AMMONIA in the last 168 hours. Coagulation Profile: Recent Labs  Lab 10/01/19 0011  INR 1.1   Cardiac Enzymes: No results for input(s): CKTOTAL, CKMB, CKMBINDEX, TROPONINI in the last 168 hours. BNP (last 3 results) No results for input(s): PROBNP in the last 8760 hours. HbA1C: No results for input(s): HGBA1C in the last 72 hours. CBG: No results for input(s): GLUCAP in the last 168 hours. Lipid Profile: No results for input(s): CHOL, HDL, LDLCALC, TRIG, CHOLHDL, LDLDIRECT in the last 72 hours. Thyroid Function Tests: No results for input(s): TSH, T4TOTAL, FREET4, T3FREE, THYROIDAB in the last 72 hours. Anemia Panel: No results for input(s): VITAMINB12, FOLATE, FERRITIN, TIBC, IRON, RETICCTPCT in the last 72 hours. Sepsis Labs: Recent Labs  Lab 10/01/19 0011  PROCALCITON <0.10  LATICACIDVEN 1.2    Recent Results (from the past 240 hour(s))  SARS CORONAVIRUS 2 (TAT 6-24 HRS) Nasopharyngeal Nasopharyngeal Swab     Status: None   Collection Time: 09/27/19 11:22 AM   Specimen: Nasopharyngeal Swab  Result Value Ref Range Status   SARS Coronavirus 2 NEGATIVE NEGATIVE Final    Comment: (NOTE) SARS-CoV-2 target nucleic acids are NOT DETECTED. The SARS-CoV-2 RNA is generally detectable in upper and lower respiratory specimens during the acute phase of infection. Negative results do not preclude SARS-CoV-2 infection, do not rule out co-infections with other pathogens, and should not be used as the sole basis for treatment or other patient management decisions. Negative results must be combined with  clinical observations, patient history, and epidemiological information. The expected result is Negative. Fact Sheet for Patients: SugarRoll.be Fact Sheet for Healthcare Providers: https://www.woods-mathews.com/ This test is not yet approved or cleared by the [place] FDA and  has been authorized for detection and/or diagnosis of SARS-CoV-2 by FDA under an Emergency Use Authorization (EUA). This EUA will remain  in effect (meaning this test can be used) for the duration of the COVID-19 declaration under Section 56 4(b)(1) of the Act, 21 U.S.C. section 360bbb-3(b)(1), unless the authorization is terminated or revoked sooner. Performed at [facility], [place] [address], [place], [place] [address]   Blood Culture (routine x 2)     Status: None (Preliminary result)   Collection Time: 10/01/19 12:11 AM   Specimen: BLOOD RIGHT FOREARM  Result Value Ref Range Status   Specimen Description   Final    BLOOD RIGHT FOREARM Performed at [facility], [place] [address], [place], [place] [address]    Special Requests   Final    BOTTLES DRAWN  AEROBIC AND ANAEROBIC Blood Culture results may not be optimal due to an inadequate volume of blood received in culture bottles Performed at [facility], [place] [address], [place], [place] [address]    Culture   Final    NO GROWTH 4 DAYS Performed at [facility], [place] [address] [place], [place] [address]    Report Status PENDING  Incomplete  SARS CORONAVIRUS 2 (TAT 6-24 HRS) Nasopharyngeal Nasopharyngeal Swab     Status: None   Collection Time: 10/01/19 12:13 AM   Specimen: Nasopharyngeal Swab  Result Value Ref Range Status   SARS Coronavirus 2 NEGATIVE NEGATIVE Final    Comment: (NOTE) SARS-CoV-2 target nucleic acids are NOT DETECTED. The SARS-CoV-2 RNA is generally detectable in upper and lower respiratory specimens during the acute phase of infection. Negative results  do not preclude SARS-CoV-2 infection, do not rule out co-infections with other pathogens, and should not be used as the sole basis for treatment or other patient management decisions. Negative results must be combined with clinical observations, patient history, and epidemiological information. The expected result is Negative. Fact Sheet for Patients: SugarRoll.be Fact Sheet for Healthcare Providers: https://www.woods-mathews.com/ This test is not yet approved or cleared by the [place] FDA and  has been authorized for detection and/or diagnosis of SARS-CoV-2 by FDA under an Emergency Use Authorization (EUA). This EUA will remain  in effect (meaning this test can be used) for the duration of the COVID-19 declaration under Section 56 4(b)(1) of the Act, 21 U.S.C. section 360bbb-3(b)(1), unless the authorization is terminated or revoked sooner. Performed at [facility], [place] [address], [place], [place] [address]   Blood Culture (routine x 2)     Status: None (Preliminary result)   Collection Time: 10/01/19 12:16 AM   Specimen: BLOOD LEFT WRIST  Result Value Ref Range Status   Specimen Description   Final    BLOOD LEFT WRIST Performed at [place] [address], [place], [place] [address]    Special Requests   Final    BOTTLES DRAWN AEROBIC AND ANAEROBIC Blood Culture adequate volume Performed at [place] [address], [place], [place] [address]    Culture   Final    NO GROWTH 4 DAYS Performed at [facility], [place] [address], [place], [place] [address]    Report Status PENDING  Incomplete  Urine culture     Status: Abnormal   Collection Time: 10/01/19  8:25 AM   Specimen: In/Out Cath Urine  Result Value Ref Range Status   Specimen Description   Final    IN/OUT CATH URINE Performed at [place] [address] [place], [place] [address]    Special Requests    Final    NONE Performed at [facility], [place] [address] [place], [place] [address]    Culture (A)  Final    <10,000 COLONIES/mL INSIGNIFICANT GROWTH Performed at [place] [address] [place],  [address]    Report Status 10/02/2019 FINAL  Final      Radiology Studies: No results found.      Scheduled Meds:  atorvastatin  80 mg Oral Daily   feeding supplement (ENSURE ENLIVE)  237 mL Oral BID BM   glycopyrrolate  1 mg Oral BID   metoprolol succinate  25 mg Oral Daily   multivitamin with minerals  1 tablet Oral Daily   polyethylene glycol  17  g Oral Daily   Continuous Infusions:  Assessment & Plan:   Abdominal pain : W/U so far unremarkable with repeat CT showing stable findings. He has been in the hospital for more than 48 hours and has not needed any pain medications.   SIRS : POA. Infectious w/u unremarkable. Sepsis ruled out. Probably initial hypotension related to dehydration and poor oral intake. Now improved  Dysphagia/Aspiration risk: He was seen by speech therapy in the past, had a speech evaluation and modified barium swallow. Patient aspirates on all consistencies. Patient is not a good candidate for artificial feeding and he does not want to eat. Patient however wants to eat as much he likes. Patient will aspirate, so he will need supervised feeding and all dysphagia precautions. He has agreed on dysphagia 2 diet with nectar thick liquid.  Hypertension: Presenting with low blood pressures and dehydration. He will continue metoprolol for heart rate control, however discontinued other antihypertensives. Check orthostatics  Subacute hypoxic respiratory failure: Likely due to rib fractures/poor inspiratory effort/atelectasis/chronic elevated left hemidiaphragm.  Patient states he was using 2 L O2 at nursing facility as well.  He is currently saturating at 100% on 1 L.  Tapered to off while I was in the room.   Decreased breath sounds at bases on lung exam.  Chest x-ray on 10/30 reported elevation of the left hemidiaphragm with left lung base atelectasis similar to prior radiograph.  CT abdomen showed small bilateral pleural effusions. Requested nurse to issue Incentive spirometry for bedside use  AKI : Patient had elevated creatinine with peak of 2.5 on 10/17 (last admission) and improved to 1.29 at the time of last discharge 10/26. Patient presented now with creatinine of 1.44 -now improved and stable ~ 1.1-1.2 (lowest it got was 0.85 on 10/30). This is likely close to his baseline ( last known creatinine 0.97-1.02 in 2013 with GFR >60). U/A does not show proteinuria and he does not meet criterion for CKD diagnosis.   Chronic leg edema: Patient states mostly positional and tends to swell when he is up on his feet.  Likely due to venous stasis.  Could also be related to hypoalbuminemia as patient also has small effusions on CT chest.  Will order support stockings.  Will avoid diuretics in concern for AKI and recent admission.  Moderate Protein calorie malnutrition : Albumin level 2.4-3.1 over the last month. Dietary eval for protein supplements.   Goals of care: Seen by Palliative care and recommended transfer back to SNF rehab with ultimate goal to go back home. Hard of hearing but oriented. Full code.    DVT prophylaxis: SCDs.  Will order TED hose for chronic leg swellings Code Status: Full code Family / Patient Communication: Discussed with patient Disposition Plan: new SNF rehab    LOS: 0 days    Time spent: 35 minutes   Guilford Shi, MD [facility] Pager [phone]  If 7PM-7AM, please contact night-coverage [email or web address] Password [facility] 10/05/2019, 11:44 AM

## 2019-10-05 NOTE — TOC Progression Note (Signed)
Transition of Care [facility]) - Progression Note    Patient Details  Name: [name] [name] MRN: [id number] Date of Birth: November 16, 1923  Transition of Care [facility] CM/SW Contact  Purcell Mouton, RN Phone Number: 10/05/2019, 12:05 PM  Clinical Narrative:    Pt has changed his mind and now want to go back to [facility]. A call was made to in house rep.    Expected Discharge Plan: Skilled Nursing Facility Barriers to Discharge: Insurance Authorization  Expected Discharge Plan and Services Expected Discharge Plan: [place]         Expected Discharge Date: 10/06/19                                     Social Determinants of Health (SDOH) Interventions    Readmission Risk Interventions No flowsheet data found.

## 2019-10-05 NOTE — TOC Progression Note (Signed)
Transition of Care [facility]) - Progression Note    Patient Details  Name: [name] [name] MRN: [id number] Date of Birth: 1923/11/30  Transition of Care [facility]) CM/SW Contact  Purcell Mouton, RN Phone Number: 10/05/2019, 10:50 AM  Clinical Narrative:    Continue to wait for ins auth.   Expected Discharge Plan: Skilled Nursing Facility Barriers to Discharge: Insurance Authorization  Expected Discharge Plan and Services Expected Discharge Plan: [place]         Expected Discharge Date: 10/04/19                                     Social Determinants of Health (SDOH) Interventions    Readmission Risk Interventions No flowsheet data found.

## 2019-10-06 ENCOUNTER — Observation Stay (HOSPITAL_COMMUNITY): Payer: Medicare HMO

## 2019-10-06 DIAGNOSIS — S37032D Laceration of left kidney, unspecified degree, subsequent encounter: Secondary | ICD-10-CM | POA: Diagnosis not present

## 2019-10-06 DIAGNOSIS — I482 Chronic atrial fibrillation, unspecified: Secondary | ICD-10-CM | POA: Diagnosis not present

## 2019-10-06 DIAGNOSIS — I5031 Acute diastolic (congestive) heart failure: Secondary | ICD-10-CM

## 2019-10-06 DIAGNOSIS — R1084 Generalized abdominal pain: Secondary | ICD-10-CM | POA: Diagnosis not present

## 2019-10-06 DIAGNOSIS — R6510 Systemic inflammatory response syndrome (SIRS) of non-infectious origin without acute organ dysfunction: Secondary | ICD-10-CM | POA: Diagnosis not present

## 2019-10-06 LAB — CULTURE, BLOOD (ROUTINE X 2)
Culture: NO GROWTH
Culture: NO GROWTH
Special Requests: ADEQUATE

## 2019-10-06 LAB — BRAIN NATRIURETIC PEPTIDE: B Natriuretic Peptide: 154.5 pg/mL — ABNORMAL HIGH (ref 0.0–100.0)

## 2019-10-06 MED ORDER — FUROSEMIDE 10 MG/ML IJ SOLN
20.0000 mg | Freq: Once | INTRAMUSCULAR | Status: AC
  Administered 2019-10-06: 21:00:00 20 mg via INTRAVENOUS
  Filled 2019-10-06: qty 2

## 2019-10-06 NOTE — Progress Notes (Signed)
PROGRESS NOTE    [name] [name]  [id number]  DOB: February 08, 1923  DOA: 09/30/2019 PCP: [name], MD  Brief Narrative:  85 y.o.malewith medical history significant for HTN,A.Fib- off anticoagulants following recent admission from 10/14-10/27 to Trauma service after fall at SNF resulting in 3 rib fx, a L kidney laceration requiring embolization/repair and acute blood loss anemia-ultimately transitioned to SNF rehab-presented to the ED from [facility] rehab on 10/29 with c/o diffuse abd pain and fever. ED Course:BP low (123XX123 systolic), Satting [id number] on RA but tachypneic,Temp 99.4,WBC 12k. Creat 1.44 ( 1.3 on last discharge) . Lactate nl, procalcitonin nl. CT abd pelvis showed stable L perirenal hematoma, no active bleed, mass effect on L kidney with decreased perfusion. Also showed fractures of L 9th-11th ribs, bilateral small pleural effusions L>R..In the emergency room, patient felt to have SIRS, he was started on sepsis pathway, 1 L IV fluid bolus, Rocephin and vancomycin were given. COVID-19 was negative. Hospital course: Admitted for observation and pain control/SIRS work up.  Extensive investigation did not reveal any evidence of infection.  Lactic acid/Procalcitonin resulted normal. He was monitored off antibiotics after initial dose of antibiotics. Final urine cultures and blood cultures no growth so far. Probably initial hypotension related to dehydration and poor oral intake.Sepsis ruled out.Noted to be on 2 L O2 and saturating well.  He states he was using 2 liters O2 at skilled nursing facility as well. PT evaluated patient and recommended return to SNF rehab. Patient however refused to go back to [place] and alternate facilities were being pursued.   Subjective:  Patient appears somewhat tachypneic on talking full sentences today.Tapered 02 to off yesterday and saturating well on RA at rest. Denies any abdominal pain now.  Eating okay.   Objective: Vitals:   10/05/19 2029 10/06/19 0648 10/06/19 0730 10/06/19 1304  BP: 111/70 124/79  127/79  Pulse: 90 (!) 105  91  Resp: 20 20  20   Temp: 98.2 F (36.8 C) (!) 97.5 F (36.4 C)  98 F (36.7 C)  TempSrc: Oral Oral  Oral  SpO2: 92% 94% 96% 98%  Weight:      Height:        Intake/Output Summary (Last 24 hours) at 10/06/2019 1757 Last data filed at 10/06/2019 1600 Gross per 24 hour  Intake 780 ml  Output 0 ml  Net 780 ml   Filed Weights   09/30/19 2357  Weight: 112.9 kg    Physical Examination:  General exam: Appears calm and comfortable  Respiratory system: Tachypneic while talking full sentences, Decreased breath sounds at bases but otherwise clear to auscultation. Cardiovascular system: S1 & S2 heard, RRR. No JVD, murmurs, rubs, gallops or clicks. No pedal edema. Gastrointestinal system: Abdomen is nondistended, soft and nontender. No organomegaly or masses felt. Normal bowel sounds heard. Central nervous system: Alert and oriented. No focal neurological deficits. Extremities: Symmetric 5 x 5 power. Worsened LE edema today on exam to 2+ pitting (has chronic 1+ right greater than left edema) Skin: No rashes, lesions or ulcers Psychiatry: Judgement and insight appear normal. Mood & affect appropriate.     Data Reviewed: I have personally reviewed following labs and imaging studies  CBC: Recent Labs  Lab 10/01/19 0011 10/01/19 0838 10/01/19 0959 10/02/19 0450  WBC 12.3* 9.1 10.9* 10.1  NEUTROABS 9.1*  --   --  7.6  HGB 11.0* 11.0* 10.2* 11.4*  HCT 34.6* 34.9* 32.9* 37.0*  MCV 100.3* 102.6* 103.1* 103.1*  PLT 358 291 330  0000000   Basic Metabolic Panel: Recent Labs  Lab 10/01/19 0011 10/01/19 0838 10/01/19 0959 10/02/19 0450 10/04/19 1609  NA 132* 138 136 135 135  K 4.2 2.6* 3.3* 4.2 4.0  CL 97* 114* 105 101 101  CO2 26 19* 24 25 27   GLUCOSE 114* 81 101* 120* 142*  BUN 46* 32* 39* 44* 33*  CREATININE 1.44* 0.85 1.08 1.23 1.11  CALCIUM 8.2* 5.4* 6.8* 8.5* 8.2*  MG   --   --   --  2.1  --    GFR: Estimated Creatinine Clearance: 52.8 mL/min (by C-G formula based on SCr of 1.11 mg/dL). Liver Function Tests: Recent Labs  Lab 10/01/19 0011  AST 49*  ALT 32  ALKPHOS 76  BILITOT 1.6*  PROT 5.8*  ALBUMIN 2.4*   No results for input(s): LIPASE, AMYLASE in the last 168 hours. No results for input(s): AMMONIA in the last 168 hours. Coagulation Profile: Recent Labs  Lab 10/01/19 0011  INR 1.1   Cardiac Enzymes: No results for input(s): CKTOTAL, CKMB, CKMBINDEX, TROPONINI in the last 168 hours. BNP (last 3 results) No results for input(s): PROBNP in the last 8760 hours. HbA1C: No results for input(s): HGBA1C in the last 72 hours. CBG: No results for input(s): GLUCAP in the last 168 hours. Lipid Profile: No results for input(s): CHOL, HDL, LDLCALC, TRIG, CHOLHDL, LDLDIRECT in the last 72 hours. Thyroid Function Tests: No results for input(s): TSH, T4TOTAL, FREET4, T3FREE, THYROIDAB in the last 72 hours. Anemia Panel: No results for input(s): VITAMINB12, FOLATE, FERRITIN, TIBC, IRON, RETICCTPCT in the last 72 hours. Sepsis Labs: Recent Labs  Lab 10/01/19 0011  PROCALCITON <0.10  LATICACIDVEN 1.2    Recent Results (from the past 240 hour(s))  SARS CORONAVIRUS 2 (TAT 6-24 HRS) Nasopharyngeal Nasopharyngeal Swab     Status: None   Collection Time: 09/27/19 11:22 AM   Specimen: Nasopharyngeal Swab  Result Value Ref Range Status   SARS Coronavirus 2 NEGATIVE NEGATIVE Final    Comment: (NOTE) SARS-CoV-2 target nucleic acids are NOT DETECTED. The SARS-CoV-2 RNA is generally detectable in upper and lower respiratory specimens during the acute phase of infection. Negative results do not preclude SARS-CoV-2 infection, do not rule out co-infections with other pathogens, and should not be used as the sole basis for treatment or other patient management decisions. Negative results must be combined with clinical observations, patient history, and  epidemiological information. The expected result is Negative. Fact Sheet for Patients: SugarRoll.be Fact Sheet for Healthcare Providers: https://www.woods-mathews.com/ This test is not yet approved or cleared by the [place] FDA and  has been authorized for detection and/or diagnosis of SARS-CoV-2 by FDA under an Emergency Use Authorization (EUA). This EUA will remain  in effect (meaning this test can be used) for the duration of the COVID-19 declaration under Section 56 4(b)(1) of the Act, 21 U.S.C. section 360bbb-3(b)(1), unless the authorization is terminated or revoked sooner. Performed at [facility], [place] [address], [place], [place] [address]   Blood Culture (routine x 2)     Status: None   Collection Time: 10/01/19 12:11 AM   Specimen: BLOOD RIGHT FOREARM  Result Value Ref Range Status   Specimen Description   Final    BLOOD RIGHT FOREARM Performed at [facility], [place] [address] [place], [place] [address]    Special Requests   Final    BOTTLES DRAWN AEROBIC AND ANAEROBIC Blood Culture results may not be optimal due to an inadequate  volume of blood received in culture bottles Performed at [place] [address] [place], [place] [address]    Culture   Final    NO GROWTH 5 DAYS Performed at [facility], [place] [address], [place], [place] [address]    Report Status 10/06/2019 FINAL  Final  SARS CORONAVIRUS 2 (TAT 6-24 HRS) Nasopharyngeal Nasopharyngeal Swab     Status: None   Collection Time: 10/01/19 12:13 AM   Specimen: Nasopharyngeal Swab  Result Value Ref Range Status   SARS Coronavirus 2 NEGATIVE NEGATIVE Final    Comment: (NOTE) SARS-CoV-2 target nucleic acids are NOT DETECTED. The SARS-CoV-2 RNA is generally detectable in upper and lower respiratory specimens during the acute phase of infection. Negative results do not preclude SARS-CoV-2 infection, do not rule out  co-infections with other pathogens, and should not be used as the sole basis for treatment or other patient management decisions. Negative results must be combined with clinical observations, patient history, and epidemiological information. The expected result is Negative. Fact Sheet for Patients: SugarRoll.be Fact Sheet for Healthcare Providers: https://www.woods-mathews.com/ This test is not yet approved or cleared by the [place] FDA and  has been authorized for detection and/or diagnosis of SARS-CoV-2 by FDA under an Emergency Use Authorization (EUA). This EUA will remain  in effect (meaning this test can be used) for the duration of the COVID-19 declaration under Section 56 4(b)(1) of the Act, 21 U.S.C. section 360bbb-3(b)(1), unless the authorization is terminated or revoked sooner. Performed at [facility], [place] [address] [place], [place] [address]   Blood Culture (routine x 2)     Status: None   Collection Time: 10/01/19 12:16 AM   Specimen: BLOOD LEFT WRIST  Result Value Ref Range Status   Specimen Description   Final    BLOOD LEFT WRIST Performed at [facility] [address], [place], [place] [address]    Special Requests   Final    BOTTLES DRAWN AEROBIC AND ANAEROBIC Blood Culture adequate volume Performed at [place] [address], [place], [place] [address]    Culture   Final    NO GROWTH 5 DAYS Performed at [facility], [place] [address] [place], [place] [address]    Report Status 10/06/2019 FINAL  Final  Urine culture     Status: Abnormal   Collection Time: 10/01/19  8:25 AM   Specimen: In/Out Cath Urine  Result Value Ref Range Status   Specimen Description   Final    IN/OUT CATH URINE Performed at [place] [address] [place], [place] [address]    Special Requests   Final    NONE Performed at [facility], [place]  [address], [place], [place] [address]    Culture (A)  Final    <10,000 COLONIES/mL INSIGNIFICANT GROWTH Performed at [place] [address], [place], [place] [address]    Report Status 10/02/2019 FINAL  Final  SARS CORONAVIRUS 2 (TAT 6-24 HRS) Nasopharyngeal Nasopharyngeal Swab     Status: None   Collection Time: 10/05/19 11:44 AM   Specimen: Nasopharyngeal Swab  Result Value Ref Range Status   SARS Coronavirus 2 NEGATIVE NEGATIVE Final    Comment: (NOTE) SARS-CoV-2 target nucleic acids are NOT DETECTED. The SARS-CoV-2 RNA is generally detectable in upper and lower respiratory specimens during the acute phase of infection. Negative results do not preclude SARS-CoV-2 infection, do not rule out  co-infections with other pathogens, and should not be used as the sole basis for treatment or other patient management decisions. Negative results must be combined with clinical observations, patient history, and epidemiological information. The expected result is Negative. Fact Sheet for Patients: SugarRoll.be Fact Sheet for Healthcare Providers: https://www.woods-mathews.com/ This test is not yet approved or cleared by the [place] FDA and  has been authorized for detection and/or diagnosis of SARS-CoV-2 by FDA under an Emergency Use Authorization (EUA). This EUA will remain  in effect (meaning this test can be used) for the duration of the COVID-19 declaration under Section 56 4(b)(1) of the Act, 21 U.S.C. section 360bbb-3(b)(1), unless the authorization is terminated or revoked sooner. Performed at [facility], [place] [address], [place], [place] [address]       Radiology Studies: Dg Chest Port 1 View  Result Date: 10/06/2019 CLINICAL DATA:  Dyspnea EXAM: PORTABLE CHEST 1 VIEW COMPARISON:  10/01/2019 FINDINGS: Stable cardiac enlargement. Aortic atherosclerosis. Chronic asymmetric elevation of the left hemidiaphragm is noted  with overlying platelike atelectasis. Pulmonary vascular congestion is noted. IMPRESSION: 1. Cardiac enlargement and pulmonary vascular congestion. 2. Asymmetric elevation of left hemidiaphragm with left base atelectasis. Electronically Signed   By: Kerby Moors M.D.   On: 10/06/2019 17:35        Scheduled Meds: . atorvastatin  80 mg Oral Daily  . feeding supplement (ENSURE ENLIVE)  237 mL Oral BID BM  . glycopyrrolate  1 mg Oral BID  . metoprolol succinate  25 mg Oral Daily  . multivitamin with minerals  1 tablet Oral Daily  . polyethylene glycol  17 g Oral Daily   Continuous Infusions:  Assessment & Plan:    1.SIRS : POA. Infectious w/u unremarkable. Sepsis ruled out. Probably initial hypotension related to dehydration and poor oral intake. Now improved BP and renal function but appears tachypneic today.  CXR shows pulmonary vascular congestion and leg swellings worse on exam. Will give 1 dose of IV lasix and monitor response. Check BNP/Echo. Also felt to be aspiration risk per ST-see below  2. Abdominal pain : Now resolved.  W/U so far unremarkable with repeat CT showing stable findings. He has been in the hospital for more than 48 hours and has not needed any pain medications.   3. Dysphagia/Aspiration risk: He was seen by speech therapy in the past, had a speech evaluation and modified barium swallow. Patient aspirates on all consistencies. Patient is not a good candidate for artificial feeding and he does not want to eat. Patient however wants to eat as much he likes. Patient will aspirate, so he will need supervised feeding and all dysphagia precautions. He has agreed on dysphagia 2 diet with nectar thick liquid.  4. Subacute hypoxic respiratory failure: Likely due to rib fractures/poor inspiratory effort/atelectasis/chronic elevated left hemidiaphragm.  Patient states he was using 2 L O2 at nursing facility as well.  He is currently saturating at 100% on 1 L.  Tapered  to off while I was in the room.  Decreased breath sounds at bases on lung exam.  Chest x-ray on 10/30 reported elevation of the left hemidiaphragm with left lung base atelectasis similar to prior radiograph.  CT abdomen showed small bilateral pleural effusions. CXR -today shows pulm vasc congestion/small efussion, elevated diaphragm.   5. AKI : Patient had elevated creatinine with peak of 2.5 on 10/17 (last admission) and improved to 1.29 at the time of last discharge 10/26. Patient presented now with creatinine of 1.44 -  now improved and stable ~ 1.1-1.2 (lowest it got was 0.85 on 10/30). This is likely close to his baseline ( last known creatinine 0.97-1.02 in 2013 with GFR >60). U/A does not show proteinuria and he does not meet criterion for CKD diagnosis.  6. Hypertension: Presenting with low blood pressures and dehydration. He will continue metoprolol for heart rate control, however discontinued other antihypertensives. Check orthostatics   7.Chronic leg edema: Patient states mostly positional and tends to swell when he is up on his feet.  Likely due to venous stasis.  Could also be related to hypoalbuminemia as patient also has small effusions on CT chest. Requested nurse to place support stockings (at bedside today). Was concerned about AKI in recent admission and avoiding diuretics but given CXR findings, Lasix x1 today. Doppler to r/o DVT.     8. Moderate Protein calorie malnutrition : Albumin level 2.4-3.1 over the last month. Dietary eval for protein supplements.   9. Goals of care: Seen by Palliative care and recommended transfer back to SNF rehab with ultimate goal to go back home. Hard of hearing but oriented. Full code.    DVT prophylaxis:  Been on SCD given recent internal bleeding/hematomas. TED hose for chronic leg swellings. Check Doppler for DVT.  Code Status: Full code Family / Patient Communication: Discussed with patient Disposition Plan: per CM , pt/ family now changed  their mind-ok to go back to [place]    LOS: 0 days    Time spent: 35 minutes   Guilford Shi, MD [facility] Pager [phone]  If 7PM-7AM, please contact night-coverage [email or web address] Password [facility] 10/06/2019, 5:57 PM

## 2019-10-06 NOTE — Progress Notes (Signed)
Initial Nutrition Assessment  RD working remotely.  DOCUMENTATION CODES:   Obesity unspecified  INTERVENTION:   - Magic cup TID with meals, each supplement provides 290 kcal and 9 grams of protein  - Encourage adequate PO intake  - Continue MVI with minerals daily  - d/c Ensure Enlive  NUTRITION DIAGNOSIS:   Increased nutrient needs related to other (fractures) as evidenced by estimated needs.  GOAL:   Patient will meet greater than or equal to 90% of their needs  MONITOR:   PO intake, Supplement acceptance, Labs, Weight trends  REASON FOR ASSESSMENT:   Consult Assessment of nutrition requirement/status  ASSESSMENT:   85 year old male who presented to the ED on 10/29 with abdominal pain. PMH of atrial fibrillation, HTN, colon cancer s/p colectomy. Recent admission from 10/14-10/27 after falling out of bed in SNF and sustaining 3 rib fractures and a left kidney laceration s/p embolization.  Pt has been accepting Ensure Enlive supplements per MAR. RD will change supplement regimen to [employer] with meals as Ensure Enlive supplements are not nectar-thick.  Weight history in chart is limited but shows pt has lost weight since 2013.  Per RN edema assessment, pt with mild pitting generalized edema and moderate to deep pitting edema to BLE.  RD was unable to reach pt via phone call to room.  Per SLP notes, pt accepting of aspiration risk and desires to eat.  Meal Completion: 40-100% x last 8 recorded meals  Medications reviewed and include: Ensure Enlive BID, MVI with minerals, Miralax  Labs reviewed.  NUTRITION - FOCUSED PHYSICAL EXAM:  Unable to complete at this time. RD working remotely.  Diet Order:   Diet Order            Diet - low sodium heart healthy        DIET DYS 2 Room service appropriate? Yes; Fluid consistency: Nectar Thick  Diet effective now              EDUCATION NEEDS:   No education needs have been identified at this time  Skin:   Skin Assessment: Reviewed RN Assessment  Last BM:  10/04/19  Height:   Ht Readings from Last 1 Encounters:  09/30/19 6' (1.829 m)    Weight:   Wt Readings from Last 1 Encounters:  09/30/19 112.9 kg    Ideal Body Weight:  80.9 kg  BMI:  Body mass index is 33.77 kg/m.  Estimated Nutritional Needs:   Kcal:  1700-1900  Protein:  90-110 grams  Fluid:  1.7-1.9 L    Gaynell Face, MS, RD, LDN Inpatient Clinical Dietitian Pager: [phone] Weekend/After Hours: [phone]

## 2019-10-06 NOTE — TOC Progression Note (Signed)
Transition of Care [facility]) - Progression Note    Patient Details  Name: [name] [name] MRN: [id number] Date of Birth: 24-Feb-1923  Transition of Care [facility]) CM/SW Contact  Samaad Hashem, Juliann Pulse, RN Phone Number: 10/06/2019, 12:18 PM  Clinical Narrative: Patient transferred. Current d/c plan awaiting on auth from insurance by facility [facility] rep [place].      Expected Discharge Plan: Skilled Nursing Facility Barriers to Discharge: Insurance Authorization  Expected Discharge Plan and Services Expected Discharge Plan: [place]         Expected Discharge Date: 10/06/19                                     Social Determinants of Health (SDOH) Interventions    Readmission Risk Interventions No flowsheet data found.

## 2019-10-06 NOTE — Progress Notes (Signed)
Physical Therapy Treatment Patient Details Name: [name] [name] MRN: [id number] DOB: [birth year] Today's Date: 10/06/2019    History of Present Illness 85 year old gentleman with recent extensive hospitalization at [facility] d/t fall at home  with trauma to  left kidney laceration, left-sided rib fractures 7-11, chronic aspiration who was brought to the hospital with concern about abdominal pain.  CAT scan of the abdomen pelvis negative. PMH: HTN, afib, colon CA    PT Comments    Pt just finishing linen change on arrival and fatigued from rolling in bed.  Pt agreeable to perform exercises in supine.  Pt reports weakness and fatigue today.  Pt awaiting news of SNF bed.     Follow Up Recommendations  SNF     Equipment Recommendations  None recommended by PT    Recommendations for Other Services       Precautions / Restrictions Precautions Precautions: Fall    Mobility  Bed Mobility Overal bed mobility: Needs Assistance Bed Mobility: Rolling Rolling: Mod assist         General bed mobility comments: pt having linen changed on arrival and RN requesting assist; pt requiring mod assist to complete rolling (states multiples rolls and fatigued)  Transfers                 General transfer comment: pt declined today due to fatigue  Ambulation/Gait                 Stairs             Wheelchair Mobility    Modified Rankin (Stroke Patients Only)       Balance                                            Cognition Arousal/Alertness: Awake/alert Behavior During Therapy: WFL for tasks assessed/performed Overall Cognitive Status: Within Functional Limits for tasks assessed                                        Exercises General Exercises - Lower Extremity Ankle Circles/Pumps: AROM;Both;10 reps Quad Sets: AROM;Both;10 reps Short Arc Quad: AROM;AAROM;10 reps;Both Heel Slides: AAROM;Both;10 reps Hip ABduction/ADduction:  AAROM;Both;10 reps    General Comments        Pertinent Vitals/Pain Pain Assessment: Faces Faces Pain Scale: Hurts even more Pain Location: lower back and right flank (reports rib fxs) Pain Descriptors / Indicators: Sore Pain Intervention(s): Monitored during session;Repositioned    Home Living                      Prior Function            PT Goals (current goals can now be found in the care plan section) Progress towards PT goals: Progressing toward goals    Frequency    Min 2X/week      PT Plan Current plan remains appropriate    Co-evaluation              AM-PAC PT "6 Clicks" Mobility   Outcome Measure  Help needed turning from your back to your side while in a flat bed without using bedrails?: A Lot Help needed moving from lying on your back to sitting on the side of a flat bed without using bedrails?: A Lot  Help needed moving to and from a bed to a chair (including a wheelchair)?: Total Help needed standing up from a chair using your arms (e.g., wheelchair or bedside chair)?: Total Help needed to walk in hospital room?: Total Help needed climbing 3-5 steps with a railing? : Total 6 Click Score: 8    End of Session   Activity Tolerance: Patient limited by fatigue Patient left: in bed;with call bell/phone within reach;with bed alarm set   PT Visit Diagnosis: Muscle weakness (generalized) (M62.81)     Time: [id number] PT Time Calculation (min) (ACUTE ONLY): 15 min  Charges:  $Therapeutic Exercise: 8-22 mins                     Carmelia Bake, PT, DPT Acute Rehabilitation Services Office: [phone] Pager: [phone]  Trena Platt 10/06/2019, 3:34 PM

## 2019-10-07 ENCOUNTER — Observation Stay (HOSPITAL_BASED_OUTPATIENT_CLINIC_OR_DEPARTMENT_OTHER): Payer: Medicare HMO

## 2019-10-07 DIAGNOSIS — I5082 Biventricular heart failure: Secondary | ICD-10-CM | POA: Diagnosis not present

## 2019-10-07 DIAGNOSIS — I361 Nonrheumatic tricuspid (valve) insufficiency: Secondary | ICD-10-CM | POA: Diagnosis not present

## 2019-10-07 DIAGNOSIS — Z8249 Family history of ischemic heart disease and other diseases of the circulatory system: Secondary | ICD-10-CM | POA: Diagnosis not present

## 2019-10-07 DIAGNOSIS — I482 Chronic atrial fibrillation, unspecified: Secondary | ICD-10-CM | POA: Diagnosis present

## 2019-10-07 DIAGNOSIS — Z6833 Body mass index (BMI) 33.0-33.9, adult: Secondary | ICD-10-CM | POA: Diagnosis not present

## 2019-10-07 DIAGNOSIS — M7989 Other specified soft tissue disorders: Secondary | ICD-10-CM

## 2019-10-07 DIAGNOSIS — R6510 Systemic inflammatory response syndrome (SIRS) of non-infectious origin without acute organ dysfunction: Secondary | ICD-10-CM | POA: Diagnosis present

## 2019-10-07 DIAGNOSIS — H9190 Unspecified hearing loss, unspecified ear: Secondary | ICD-10-CM | POA: Diagnosis present

## 2019-10-07 DIAGNOSIS — I959 Hypotension, unspecified: Secondary | ICD-10-CM | POA: Diagnosis present

## 2019-10-07 DIAGNOSIS — I509 Heart failure, unspecified: Secondary | ICD-10-CM

## 2019-10-07 DIAGNOSIS — J9601 Acute respiratory failure with hypoxia: Secondary | ICD-10-CM | POA: Diagnosis present

## 2019-10-07 DIAGNOSIS — R1084 Generalized abdominal pain: Secondary | ICD-10-CM | POA: Diagnosis not present

## 2019-10-07 DIAGNOSIS — Z66 Do not resuscitate: Secondary | ICD-10-CM | POA: Diagnosis present

## 2019-10-07 DIAGNOSIS — I878 Other specified disorders of veins: Secondary | ICD-10-CM | POA: Diagnosis present

## 2019-10-07 DIAGNOSIS — E440 Moderate protein-calorie malnutrition: Secondary | ICD-10-CM | POA: Diagnosis present

## 2019-10-07 DIAGNOSIS — E860 Dehydration: Secondary | ICD-10-CM | POA: Diagnosis present

## 2019-10-07 DIAGNOSIS — R0682 Tachypnea, not elsewhere classified: Secondary | ICD-10-CM | POA: Diagnosis present

## 2019-10-07 DIAGNOSIS — K219 Gastro-esophageal reflux disease without esophagitis: Secondary | ICD-10-CM | POA: Diagnosis present

## 2019-10-07 DIAGNOSIS — E785 Hyperlipidemia, unspecified: Secondary | ICD-10-CM | POA: Diagnosis present

## 2019-10-07 DIAGNOSIS — Z20828 Contact with and (suspected) exposure to other viral communicable diseases: Secondary | ICD-10-CM | POA: Diagnosis present

## 2019-10-07 DIAGNOSIS — I130 Hypertensive heart and chronic kidney disease with heart failure and stage 1 through stage 4 chronic kidney disease, or unspecified chronic kidney disease: Secondary | ICD-10-CM | POA: Diagnosis present

## 2019-10-07 DIAGNOSIS — Z9181 History of falling: Secondary | ICD-10-CM | POA: Diagnosis not present

## 2019-10-07 DIAGNOSIS — N179 Acute kidney failure, unspecified: Secondary | ICD-10-CM | POA: Diagnosis present

## 2019-10-07 DIAGNOSIS — Z87891 Personal history of nicotine dependence: Secondary | ICD-10-CM | POA: Diagnosis not present

## 2019-10-07 DIAGNOSIS — I50810 Right heart failure, unspecified: Secondary | ICD-10-CM | POA: Diagnosis present

## 2019-10-07 DIAGNOSIS — N183 Chronic kidney disease, stage 3 unspecified: Secondary | ICD-10-CM | POA: Diagnosis present

## 2019-10-07 DIAGNOSIS — I5041 Acute combined systolic (congestive) and diastolic (congestive) heart failure: Secondary | ICD-10-CM | POA: Diagnosis present

## 2019-10-07 DIAGNOSIS — Z85038 Personal history of other malignant neoplasm of large intestine: Secondary | ICD-10-CM | POA: Diagnosis not present

## 2019-10-07 DIAGNOSIS — Z9049 Acquired absence of other specified parts of digestive tract: Secondary | ICD-10-CM | POA: Diagnosis not present

## 2019-10-07 LAB — BASIC METABOLIC PANEL
Anion gap: 9 (ref 5–15)
BUN: 31 mg/dL — ABNORMAL HIGH (ref 8–23)
CO2: 27 mmol/L (ref 22–32)
Calcium: 8.3 mg/dL — ABNORMAL LOW (ref 8.9–10.3)
Chloride: 100 mmol/L (ref 98–111)
Creatinine, Ser: 1.19 mg/dL (ref 0.61–1.24)
GFR calc Af Amer: >60 mL/min (ref >60)
GFR calc non Af Amer: 52 mL/min — ABNORMAL LOW (ref >60)
Glucose, Bld: 139 mg/dL — ABNORMAL HIGH (ref 70–99)
Potassium: 3.9 mmol/L (ref 3.5–5.1)
Sodium: 136 mmol/L (ref 135–145)

## 2019-10-07 LAB — CBC
HCT: 37.2 % — ABNORMAL LOW (ref 39.0–52.0)
Hemoglobin: 11.6 g/dL — ABNORMAL LOW (ref 13.0–17.0)
MCH: 32 pg (ref 26.0–34.0)
MCHC: 31.2 g/dL (ref 30.0–36.0)
MCV: 102.5 fL — ABNORMAL HIGH (ref 80.0–100.0)
Platelets: 369 10*3/uL (ref 150–400)
RBC: 3.63 MIL/uL — ABNORMAL LOW (ref 4.22–5.81)
RDW: 15.6 % — ABNORMAL HIGH (ref 11.5–15.5)
WBC: 7.7 10*3/uL (ref 4.0–10.5)
nRBC: 0 % (ref 0.0–0.2)

## 2019-10-07 LAB — ECHOCARDIOGRAM COMPLETE
Height: 72 in
Weight: 3984 oz

## 2019-10-07 MED ORDER — FUROSEMIDE 10 MG/ML IJ SOLN
20.0000 mg | Freq: Once | INTRAMUSCULAR | Status: AC
  Administered 2019-10-07: 20 mg via INTRAVENOUS
  Filled 2019-10-07: qty 2

## 2019-10-07 NOTE — Progress Notes (Addendum)
PROGRESS NOTE    Hager Zima  [id number]  DOB: [birth year]  DOA: 09/30/2019 PCP: [name], MD  Brief Narrative:  85 y.o.malewith medical history significant for HTN,A.Fib- off anticoagulants following recent admission from 10/14-10/27 to Trauma service after fall at SNF resulting in 3 rib fx, a L kidney laceration requiring embolization/repair and acute blood loss anemia-ultimately transitioned to SNF rehab-presented to the ED from [facility] rehab on 10/29 with c/o diffuse abd pain and fever. ED Course:BP low (123XX123 systolic), Satting [id number] on RA but tachypneic,Temp 99.4,WBC 12k. Creat 1.44 ( 1.3 on last discharge) . Lactate nl, procalcitonin nl. CT abd pelvis showed stable L perirenal hematoma, no active bleed, mass effect on L kidney with decreased perfusion. Also showed fractures of L 9th-11th ribs, bilateral small pleural effusions L>R..In the emergency room, patient felt to have SIRS, he was started on sepsis pathway, 1 L IV fluid bolus, Rocephin and vancomycin were given. COVID-19 was negative. Hospital course: Admitted for observation and pain control/SIRS work up.  Extensive investigation did not reveal any evidence of infection.  Lactic acid/Procalcitonin resulted normal. He was monitored off antibiotics after initial dose of antibiotics. Final urine cultures and blood cultures no growth so far. Probably initial hypotension related to dehydration and poor oral intake.Sepsis ruled out. PT evaluated patient and recommended return to SNF rehab. Patient however refused to go back to [place] in concern for "active Covid cases" and perceived poor care.  Case management started pursuing alternate facilities/insurance authorization.  While awaiting acceptance at a new facility, patient noted to have mild leg swellings and also noted to be on 2 L nasal cannula O2 which he states was started at the SNF rehab prior to admission.  Given admission chest x-ray showing elevated  diaphragm, patient was advised incentive spirometry and O2 was slowly tapered to off.  Patient however appeared to be tachypneic on 11/4 with chest x-ray showing pulmonary vascular congestion and clinical exam revealing worsening bilateral leg swellings to 2+ pitting edema.  BNP somewhat elevated at 150.  Patient felt to have volume overload from initial IV hydration versus acute diastolic CHF.  He was started on IV Lasix (received 1 dose on 11/4 and 1 dose today) while watching blood pressure/renal function closely.  Of note, patient after discussing with his primary caregiver Mr. Sabra Heck, decided to go back to prior facility [facility]) but still awaits insurance authorization as of today.  Given ongoing acute medical issues requiring IV therapy and close monitoring with daily lab work, will upgrade to inpatient status.  Subjective:  Patient appears improved today and less tachypneic.  Leg swellings improved but still present.  Patient's listed contact Mr. Sabra Heck (friend) at bedside and states he is the primary person who has been involved with his care and has been visiting him in the hospital.  He denies noticing leg swellings when he took patient to doctors appointments prior to this hospital admission.  Objective: Vitals:   10/07/19 0608 10/07/19 1014 10/07/19 1016 10/07/19 1341  BP: 119/84 120/82 129/75 122/74  Pulse: (!) 105 92 92 96  Resp: 18   20  Temp: 97.8 F (36.6 C)   98.1 F (36.7 C)  TempSrc: Oral   Oral  SpO2: 93%  96% 94%  Weight:      Height:        Intake/Output Summary (Last 24 hours) at 10/07/2019 1635 Last data filed at 10/07/2019 0612 Gross per 24 hour  Intake 240 ml  Output 1750 ml  Net -1510 ml   Filed Weights   09/30/19 2357  Weight: 112.9 kg    Physical Examination:  General exam: Appears calm and comfortable  Respiratory system: Improved respiratory effort, Decreased breath sounds at bases but otherwise clear to auscultation. Cardiovascular system:  S1 & S2 heard, RRR. No JVD, murmurs, rubs, gallops or clicks. No pedal edema. Gastrointestinal system: Abdomen is nondistended, soft and nontender. No organomegaly or masses felt. Normal bowel sounds heard. Central nervous system: Alert and oriented. No focal neurological deficits. Extremities: Symmetric 5 x 5 power.  Improving LE edema today on exam  (?  Chronic 1+ right greater than left edema) Skin: No rashes, lesions or ulcers Psychiatry: Judgement and insight appear normal. Mood & affect appropriate.     Data Reviewed: I have personally reviewed following labs and imaging studies  CBC: Recent Labs  Lab 10/01/19 0011 10/01/19 0838 10/01/19 0959 10/02/19 0450 10/07/19 0857  WBC 12.3* 9.1 10.9* 10.1 7.7  NEUTROABS 9.1*  --   --  7.6  --   HGB 11.0* 11.0* 10.2* 11.4* 11.6*  HCT 34.6* 34.9* 32.9* 37.0* 37.2*  MCV 100.3* 102.6* 103.1* 103.1* 102.5*  PLT 358 291 330 369 0000000   Basic Metabolic Panel: Recent Labs  Lab 10/01/19 0838 10/01/19 0959 10/02/19 0450 10/04/19 1609 10/07/19 0857  NA 138 136 135 135 136  K 2.6* 3.3* 4.2 4.0 3.9  CL 114* 105 101 101 100  CO2 19* 24 25 27 27   GLUCOSE 81 101* 120* 142* 139*  BUN 32* 39* 44* 33* 31*  CREATININE 0.85 1.08 1.23 1.11 1.19  CALCIUM 5.4* 6.8* 8.5* 8.2* 8.3*  MG  --   --  2.1  --   --    GFR: Estimated Creatinine Clearance: 49.2 mL/min (by C-G formula based on SCr of 1.19 mg/dL). Liver Function Tests: Recent Labs  Lab 10/01/19 0011  AST 49*  ALT 32  ALKPHOS 76  BILITOT 1.6*  PROT 5.8*  ALBUMIN 2.4*   No results for input(s): LIPASE, AMYLASE in the last 168 hours. No results for input(s): AMMONIA in the last 168 hours. Coagulation Profile: Recent Labs  Lab 10/01/19 0011  INR 1.1   Cardiac Enzymes: No results for input(s): CKTOTAL, CKMB, CKMBINDEX, TROPONINI in the last 168 hours. BNP (last 3 results) No results for input(s): PROBNP in the last 8760 hours. HbA1C: No results for input(s): HGBA1C in the last  72 hours. CBG: No results for input(s): GLUCAP in the last 168 hours. Lipid Profile: No results for input(s): CHOL, HDL, LDLCALC, TRIG, CHOLHDL, LDLDIRECT in the last 72 hours. Thyroid Function Tests: No results for input(s): TSH, T4TOTAL, FREET4, T3FREE, THYROIDAB in the last 72 hours. Anemia Panel: No results for input(s): VITAMINB12, FOLATE, FERRITIN, TIBC, IRON, RETICCTPCT in the last 72 hours. Sepsis Labs: Recent Labs  Lab 10/01/19 0011  PROCALCITON <0.10  LATICACIDVEN 1.2    Recent Results (from the past 240 hour(s))  Blood Culture (routine x 2)     Status: None   Collection Time: 10/01/19 12:11 AM   Specimen: BLOOD RIGHT FOREARM  Result Value Ref Range Status   Specimen Description   Final    BLOOD RIGHT FOREARM Performed at [facility], [place] [address], [place],  [address]    Special Requests   Final    BOTTLES DRAWN AEROBIC AND ANAEROBIC Blood Culture results may not be optimal due to an inadequate volume of blood received in culture bottles Performed at [facility],  [place] [address] [place], [place] [address]    Culture   Final    NO GROWTH 5 DAYS Performed at [facility], [place] [address], [place], [place] [address]    Report Status 10/06/2019 FINAL  Final  SARS CORONAVIRUS 2 (TAT 6-24 HRS) Nasopharyngeal Nasopharyngeal Swab     Status: None   Collection Time: 10/01/19 12:13 AM   Specimen: Nasopharyngeal Swab  Result Value Ref Range Status   SARS Coronavirus 2 NEGATIVE NEGATIVE Final    Comment: (NOTE) SARS-CoV-2 target nucleic acids are NOT DETECTED. The SARS-CoV-2 RNA is generally detectable in upper and lower respiratory specimens during the acute phase of infection. Negative results do not preclude SARS-CoV-2 infection, do not rule out co-infections with other pathogens, and should not be used as the sole basis for treatment or other patient management decisions. Negative results must be combined with clinical  observations, patient history, and epidemiological information. The expected result is Negative. Fact Sheet for Patients: SugarRoll.be Fact Sheet for Healthcare Providers: https://www.woods-mathews.com/ This test is not yet approved or cleared by the [place] FDA and  has been authorized for detection and/or diagnosis of SARS-CoV-2 by FDA under an Emergency Use Authorization (EUA). This EUA will remain  in effect (meaning this test can be used) for the duration of the COVID-19 declaration under Section 56 4(b)(1) of the Act, 21 U.S.C. section 360bbb-3(b)(1), unless the authorization is terminated or revoked sooner. Performed at [facility], [place] [address], [place], [place] [address]   Blood Culture (routine x 2)     Status: None   Collection Time: 10/01/19 12:16 AM   Specimen: BLOOD LEFT WRIST  Result Value Ref Range Status   Specimen Description   Final    BLOOD LEFT WRIST Performed at [facility] [address], [place], [place] [address]    Special Requests   Final    BOTTLES DRAWN AEROBIC AND ANAEROBIC Blood Culture adequate volume Performed at [facility] [address], [place], [place] [address]    Culture   Final    NO GROWTH 5 DAYS Performed at [facility], [place] [address], [place], [place] [address]    Report Status 10/06/2019 FINAL  Final  Urine culture     Status: Abnormal   Collection Time: 10/01/19  8:25 AM   Specimen: In/Out Cath Urine  Result Value Ref Range Status   Specimen Description   Final    IN/OUT CATH URINE Performed at [facility] [address] [place], [place] [address]    Special Requests   Final    NONE Performed at [facility], [place] [address], [place], [place] [address]    Culture (A)  Final    <10,000 COLONIES/mL INSIGNIFICANT GROWTH Performed at [facility] [address], [place], [place] [address]     Report Status 10/02/2019 FINAL  Final  SARS CORONAVIRUS 2 (TAT 6-24 HRS) Nasopharyngeal Nasopharyngeal Swab     Status: None   Collection Time: 10/05/19 11:44 AM   Specimen: Nasopharyngeal Swab  Result Value Ref Range Status   SARS Coronavirus 2 NEGATIVE NEGATIVE Final    Comment: (NOTE) SARS-CoV-2 target nucleic acids are NOT DETECTED. The SARS-CoV-2 RNA is generally detectable in upper and lower respiratory specimens during the acute phase of infection. Negative results do not preclude SARS-CoV-2 infection, do not rule out co-infections with other pathogens, and should not be used as the sole basis  for treatment or other patient management decisions. Negative results must be combined with clinical observations, patient history, and epidemiological information. The expected result is Negative. Fact Sheet for Patients: SugarRoll.be Fact Sheet for Healthcare Providers: https://www.woods-mathews.com/ This test is not yet approved or cleared by the [place] FDA and  has been authorized for detection and/or diagnosis of SARS-CoV-2 by FDA under an Emergency Use Authorization (EUA). This EUA will remain  in effect (meaning this test can be used) for the duration of the COVID-19 declaration under Section 56 4(b)(1) of the Act, 21 U.S.C. section 360bbb-3(b)(1), unless the authorization is terminated or revoked sooner. Performed at [facility], [place] [address] [place], [place] [address]       Radiology Studies: Dg Chest Port 1 View  Result Date: 10/06/2019 CLINICAL DATA:  Dyspnea EXAM: PORTABLE CHEST 1 VIEW COMPARISON:  10/01/2019 FINDINGS: Stable cardiac enlargement. Aortic atherosclerosis. Chronic asymmetric elevation of the left hemidiaphragm is noted with overlying platelike atelectasis. Pulmonary vascular congestion is noted. IMPRESSION: 1. Cardiac enlargement and pulmonary vascular congestion. 2. Asymmetric elevation of left  hemidiaphragm with left base atelectasis. Electronically Signed   By: Kerby Moors M.D.   On: 10/06/2019 17:35   Vas [place] Lower Extremity Venous (dvt)  Result Date: 10/07/2019  Lower Venous Study Indications: Swelling.  Comparison Study: No prior study. Performing Technologist: Maudry Mayhew MHA, RDMS, RVT, RDCS  Examination Guidelines: A complete evaluation includes B-mode imaging, spectral Doppler, color Doppler, and power Doppler as needed of all accessible portions of each vessel. Bilateral testing is considered an integral part of a complete examination. Limited examinations for reoccurring indications may be performed as noted.  +---------+---------------+---------+-----------+----------+--------------+  RIGHT     Compressibility Phasicity Spontaneity Properties Thrombus Aging  +---------+---------------+---------+-----------+----------+--------------+  CFV       Full            Yes       Yes                                    +---------+---------------+---------+-----------+----------+--------------+  SFJ       Full                                                             +---------+---------------+---------+-----------+----------+--------------+  FV Prox   Full                                                             +---------+---------------+---------+-----------+----------+--------------+  FV Mid    Full                                                             +---------+---------------+---------+-----------+----------+--------------+  FV Distal Full                                                             +---------+---------------+---------+-----------+----------+--------------+  PFV       Full                                                             +---------+---------------+---------+-----------+----------+--------------+  POP       Full            Yes       Yes                                    +---------+---------------+---------+-----------+----------+--------------+  PTV        Full                                                             +---------+---------------+---------+-----------+----------+--------------+  +---------+---------------+---------+-----------+----------+--------------+  LEFT      Compressibility Phasicity Spontaneity Properties Thrombus Aging  +---------+---------------+---------+-----------+----------+--------------+  CFV       Full            Yes       Yes                                    +---------+---------------+---------+-----------+----------+--------------+  SFJ       Full                                                             +---------+---------------+---------+-----------+----------+--------------+  FV Prox   Full                                                             +---------+---------------+---------+-----------+----------+--------------+  FV Mid    Full                                                             +---------+---------------+---------+-----------+----------+--------------+  FV Distal Full                                                             +---------+---------------+---------+-----------+----------+--------------+  PFV       Full                                                             +---------+---------------+---------+-----------+----------+--------------+  POP       Full            Yes       Yes                                    +---------+---------------+---------+-----------+----------+--------------+  PTV       Full                                                             +---------+---------------+---------+-----------+----------+--------------+  PERO      Full                                                             +---------+---------------+---------+-----------+----------+--------------+  Summary: Right: There is no evidence of deep vein thrombosis in the lower extremity. However, portions of this examination were limited- see technologist comments above. No cystic structure found in the  popliteal fossa. Left: There is no evidence of deep vein thrombosis in the lower extremity. No cystic structure found in the popliteal fossa.  *See table(s) above for measurements and observations. Electronically signed by Monica Martinez MD on 10/07/2019 at 3:41:09 PM.    Final         Scheduled Meds:  atorvastatin  80 mg Oral Daily   feeding supplement (ENSURE ENLIVE)  237 mL Oral BID BM   glycopyrrolate  1 mg Oral BID   metoprolol succinate  25 mg Oral Daily   multivitamin with minerals  1 tablet Oral Daily   polyethylene glycol  17 g Oral Daily   Continuous Infusions:  Assessment & Plan:    1.SIRS : POA. Infectious w/u unremarkable. Sepsis ruled out. Probably initial hypotension related to dehydration and poor oral intake. Now improved BP and renal function, off IV fluids.  Patient appeared tachypneic 11/4 with chest x-ray showing pulmonary vascular congestion and leg swellings worse on exam.  BNP slightly elevated at 150.  Echocardiogram done today-results pending.    2. Abdominal pain : Now resolved.  W/U so far unremarkable with repeat CT showing stable findings. He has been in the hospital for more than 48 hours and has not needed any pain medications.   3. Subacute hypoxic respiratory failure: Likely due to rib fractures/poor inspiratory effort/atelectasis/chronic elevated left hemidiaphragm.  Patient states he was using 2 L O2 at nursing facility as well.  He is currently saturating at 100% on 1 L.  Tapered to off while I was in the room.  Decreased breath sounds at bases on lung exam.  Chest x-ray on 10/30 reported elevation of the left hemidiaphragm with left lung base atelectasis similar to prior radiograph.  CT abdomen showed small bilateral pleural effusions. CXR -11/4 showed pulm vasc congestion/small efussion--secondary to volume overload from IV fluids that he received on admission versus acute diastolic CHF.  Await echo results.  Chronically elevated diaphragm.   Continue incentive spirometry.Patient received 1 dose of IV lasix yesterday and appears much improved.  Will repeat 1 dose today as renal function stable.   4. Dysphagia/Aspiration  risk: He was seen by speech therapy in the past, had a speech evaluation and modified barium swallow. Patient aspirates on all consistencies. Patient is not a good candidate for artificial feeding and he does not want to eat. Patient however wants to eat as much he likes. Patient will aspirate, so he will need supervised feeding and all dysphagia precautions. He has agreed on dysphagia 2 diet with nectar thick liquid.  5. AKI : Patient had elevated creatinine with peak of 2.5 on 10/17 (last admission) and improved to 1.29 at the time of last discharge 10/26. Patient presented now with creatinine of 1.44 -now improved and stable ~ 1.1-1.2 (lowest it got was 0.85 on 10/30). This is likely close to his baseline ( last known creatinine 0.97-1.02 in 2013 with GFR >60). U/A does not show proteinuria and he does not meet criterion for CKD diagnosis.  6. Hypertension: Presenting with low blood pressures and dehydration. He will continue metoprolol for heart rate control, however discontinued other antihypertensives.  Unable to check orthostatics per nurse as patient afraid of standing.    7.Chronic leg edema: Patient states mostly positional and tends to swell when he is up on his feet.  Likely due to venous stasis.  Could also be related to hypoalbuminemia as patient also has small effusions on CT chest.  Was concerned about AKI in recent admission and avoiding diuretics but given CXR findings, Lasix given yesterday and today.  He now has support stockings in place.Dopplers negative for DVT.     8. Moderate Protein calorie malnutrition : Albumin level 2.4-3.1 over the last month. Dietary eval for protein supplements.   9.  Deconditioning/immobility: According to friend, patient was mostly chair bound and transferred with  assist even before prior admission.  Patient states he is afraid to stand as his  legs give away and he has been more reluctant since last admission.  Goals of care: Seen by Palliative care and recommended transfer back to SNF rehab with ultimate goal to go back home. Hard of hearing but oriented. Full code.    DVT prophylaxis:  Been on SCD given recent internal bleeding/hematomas. TED hose for chronic leg swellings.  Code Status: Full code Family / Patient Communication: Discussed with patient Disposition Plan: per CM , pt/ family now changed their mind-ok to go back to [facility].  Hopefully can go in a.m.    LOS: 0 days    Time spent: 35 minutes   Guilford Shi, MD [facility] Pager [phone]  If 7PM-7AM, please contact night-coverage [email or web address] Password [facility] 10/07/2019, 4:35 PM

## 2019-10-07 NOTE — Progress Notes (Signed)
Bilateral lower extremity venous duplex completed. Refer to "CV Proc" under chart review to view preliminary results.  10/07/2019 2:48 PM Kelby Aline., MHA, RVT, RDCS, RDMS

## 2019-10-07 NOTE — Progress Notes (Signed)
Echocardiogram 2D Echocardiogram has been performed.  [name] [name] 10/07/2019, 9:16 AM

## 2019-10-07 NOTE — Progress Notes (Signed)
Orthostatic VS ordered by MD. RN and NT went to assist Pt Pt's BP lying 129/75 Pulse 92. Pt tried to sit on side of bed Pt with c/o being dizzy and leaning back.. Pt states "I am not standing up I can't" pt placed back in the bed.

## 2019-10-08 DIAGNOSIS — I5082 Biventricular heart failure: Secondary | ICD-10-CM

## 2019-10-08 DIAGNOSIS — N179 Acute kidney failure, unspecified: Secondary | ICD-10-CM

## 2019-10-08 LAB — BASIC METABOLIC PANEL
Anion gap: 11 (ref 5–15)
BUN: 31 mg/dL — ABNORMAL HIGH (ref 8–23)
CO2: 24 mmol/L (ref 22–32)
Calcium: 8.4 mg/dL — ABNORMAL LOW (ref 8.9–10.3)
Chloride: 100 mmol/L (ref 98–111)
Creatinine, Ser: 1.2 mg/dL (ref 0.61–1.24)
GFR calc Af Amer: 60 mL/min — ABNORMAL LOW (ref >60)
GFR calc non Af Amer: 51 mL/min — ABNORMAL LOW (ref >60)
Glucose, Bld: 126 mg/dL — ABNORMAL HIGH (ref 70–99)
Potassium: 3.9 mmol/L (ref 3.5–5.1)
Sodium: 135 mmol/L (ref 135–145)

## 2019-10-08 LAB — HEPATIC FUNCTION PANEL
ALT: 26 U/L (ref 0–44)
AST: 33 U/L (ref 15–41)
Albumin: 2.4 g/dL — ABNORMAL LOW (ref 3.5–5.0)
Alkaline Phosphatase: 83 U/L (ref 38–126)
Bilirubin, Direct: 0.3 mg/dL — ABNORMAL HIGH (ref 0.0–0.2)
Indirect Bilirubin: 1.2 mg/dL — ABNORMAL HIGH (ref 0.3–0.9)
Total Bilirubin: 1.5 mg/dL — ABNORMAL HIGH (ref 0.3–1.2)
Total Protein: 5.8 g/dL — ABNORMAL LOW (ref 6.5–8.1)

## 2019-10-08 LAB — VITAMIN D 25 HYDROXY (VIT D DEFICIENCY, FRACTURES): Vit D, 25-Hydroxy: 29.2 ng/mL — ABNORMAL LOW (ref 30–100)

## 2019-10-08 MED ORDER — FUROSEMIDE 20 MG PO TABS: 20.0000 mg | ORAL_TABLET | Freq: Every day | ORAL | 11 refills | Status: AC | PRN

## 2019-10-08 MED ORDER — RESOURCE THICKENUP CLEAR PO POWD: 1.0000 g | ORAL | Status: AC | PRN

## 2019-10-08 MED ORDER — METOPROLOL SUCCINATE ER 25 MG PO TB24: 25.0000 mg | ORAL_TABLET | Freq: Every day | ORAL | Status: AC

## 2019-10-08 NOTE — TOC Transition Note (Signed)
Transition of Care [facility]) - CM/SW Discharge Note   Patient Details  Name: [name] [name] MRN: [id number] Date of Birth: Apr 29, 1923  Transition of Care [facility] CM/SW Contact:  Dessa Phi, RN Phone Number: 10/08/2019, 10:16 AM   Clinical Narrative:  D/c today to [facility] auth, covid neg on 11/3-rep alison/Tammy able to accept going to rm 225,Nurse call report tel#[phone].Will fax d/c summary. PT[facility] to transport.    Final next level of care: Skilled Nursing Facility Barriers to Discharge: No Barriers Identified   Patient Goals and CMS Choice Patient states their goals for this hospitalization and ongoing recovery are:: To go to rehab safely.      Discharge Placement              Patient chooses bed at: [facility]) Patient to be transferred to facility by: [place] Name of family member notified: patient states he will call family on own Patient and family notified of of transfer: 10/08/19  Discharge Plan and Services                                     Social Determinants of Health (SDOH) Interventions     Readmission Risk Interventions No flowsheet data found.

## 2019-10-08 NOTE — TOC Progression Note (Signed)
Transition of Care [facility]) - Progression Note    Patient Details  Name: [name] [name] MRN: [id number] Date of Birth: 1922-12-12  Transition of Care [facility]) CM/SW Contact  Hope Holst, Juliann Pulse, RN Phone Number: 10/08/2019, 1:16 PM  Clinical Narrative:  PT[facility] called. Nsg aware of rm 105 @ [place] tel# for report [phone].     Expected Discharge Plan: Skilled Nursing Facility Barriers to Discharge: No Barriers Identified  Expected Discharge Plan and Services Expected Discharge Plan: [place]         Expected Discharge Date: 10/08/19                                     Social Determinants of Health (SDOH) Interventions    Readmission Risk Interventions No flowsheet data found.

## 2019-10-08 NOTE — Discharge Summary (Addendum)
Physician Discharge Summary  [name] URK:[id number] DOB: 1923/09/16 DOA: 09/30/2019  PCP: [name], MD  Admit date: 09/30/2019 Discharge date: 10/08/2019 Consultations: Palliative care, Dr Rowe Pavy Admitted From: SNF [facility]: [name]  Discharge Diagnoses:  Principal Problem:   SIRS (systemic inflammatory response syndrome) ([place]) Active Problems:   Acute CHF (congestive heart failure) (H[facility])   AKI (acute kidney injury) ([place])   HTN (hypertension)   A-fib ([place])   Kidney laceration, left   Abdominal pain   Multiple rib fractures   CKD (chronic kidney disease) stage 3, GFR 30-59 ml/min   Hospital Course Summary: 85 y.o.malewith medical history significantfor HTN,A.Fib- off anticoagulantsfollowing recent admissionfrom 10/14-10/27 to Trauma serviceafter fallatSNF resulting in3 rib fx,a L kidney laceration requiring embolization/repair andacute blood loss anemia-ultimately transitioned toSNF rehab-[facility] on [facility]29with c/o diffuseabd painand fever. ED Course:BP low (83T-51V systolic), Satting 61% on RAbuttachypneic,Temp 99.4,WBC 12k. Creat 1.44(1.3 on lastdischarge). Lactate nl, procalcitonin nl. CT abd pelvis showedstable L perirenal hematoma, no active bleed,mass effect on L kidney with decreased perfusion. Also showedfractures of L 9th-11thribs, bilateral small pleural effusions L>R..In the emergency room, patient felt to have SIRS,he was started on sepsis pathway, 1 L IV fluid bolus, Rocephin and vancomycin were given. COVID-19 was negative.  Hospital course:Admitted for observation and pain control/SIRS work up. Extensive investigation did not reveal any evidence of infection. Lactic acid/Procalcitoninresultednormal. He was monitored off antibiotics after initial dose of antibiotics. Final urine cultures and blood cultures no growth so far. Probablyinitial hypotensionrelated to dehydration and poor  oral intake.Sepsis ruled out.  Interestingly, patient noted to have low calcium reported on BMP done at 8:30 AM on 10/30 but patient had earlier (little after midnight) lab on the same day reporting calcium of 8.2.  Subsequent labs have also shown stable calcium level around 8.2-8.5 with albumin level around 2.4 indicating corrected calcium likely within normal limits.  Vitamin D level/PTH level have been requested.   PT evaluated patient and recommended return to SNF rehab. Patient, however, initially refused to go back to [place] in concern for "active Covid cases" and perceived poor care. Case management started pursuing alternate facilities/insurance authorization.  While awaiting acceptance at a new facility, patient noted to have mild leg swellings and also noted to be on 2 L nasal cannula O2 which he states was started at the SNF rehab prior to admission.  Given admission chest x-ray showing elevated diaphragm, patient was advised incentive spirometry and O2 was slowly tapered to off.  Patient however appeared to have worsening leg swellings and was noted to be tachypneic on 11/4 with chest x-ray showing pulmonary vascular congestion.  BNP somewhat elevated at 150. Echocardiogram revealed mild LVH, increased right ventricular pressure and dilated IVC. Patient felt to have volume overload from initial IV hydration versus acute CHF.  He was started on IV Lasix (received 1 dose on 11/4 and 1 dose 10/15) while watching blood pressure/renal function closely.   Of note, patient after discussing with his primary caregiver Mr. Sabra Heck, decided to go back to prior facility [facility]) and per case management insurance authorization available and patient can return today. Repeat labs today show creatinine trending up again and at 1.2.  His leg swellings are now improved although still present.  He was on losartan/HCTZ in the past which were held for AKI.  At this point he can return to skilled nursing  facility with Lasix use as needed for worsening leg swellings or dyspnea.  He is saturating well on room air and tolerating support stockings (recommend to continue at skilled nursing facility for daytime use as appear to be helping leg swellings)   1.SIRS : POA.Probablyinitial hypotensionrelated to dehydration and poor oral intake.Now improved BP and renal function, off IV fluids. Infectious w/u unremarkable. Sepsis ruled out.  2. Abdominal pain : Now resolved.  W/U so far unremarkable with repeat CT showing stable findings. He has not needed any pain medications for the last 3 to 4 days and tolerating diet well.  3. Subacute hypoxic respiratory failure: Patient apparently was started on O2 at skilled nursing facility sometime after last discharge.  He was however saturating 100% on 2 L while here.  Chest x-ray on 10/30 reported elevation of the left hemidiaphragm with left lung base atelectasis similar to prior radiograph. His initial hypoxia could have been secondary to rib fractures/poor inspiratory effort/atelectasis/chronic elevated left hemidiaphragm.  Patient was encouraged incentive spirometry and O2 was tapered to off on 11/2.   Recommend to continue incentive spirometry while at SNF  4.  Acute combined left-sided diastolic/right heart failure: Patient had tachypnea and increased leg swellings on 11/4 with CXR showing pulm vasc congestion/small effusion--this was felt secondary to volume overload from IV fluids that he received on admission versus acute CHF.  Improved with Lasix 20 mg IV x2.   Recommend to  continue support stockings and to use Lasix as needed at skilled nursing facility.  Venous Doppler negative for DVT.  5. AKI : Patient had elevated creatinine with peak of 2.5 on 10/17 (last admission) and improved to 1.29 at the time of last discharge 10/26. Patient presented on 10/29 with creatinine of 1.44 -now improved and stable ~ 1.1-1.2 (lowest it got was 0.85 on 10/30). This  is likely close to his baseline ( last known creatinine 0.97-1.02 in 2013 with GFR >60). U/A does not show proteinuria and he does not meet criterion for CKD diagnosis.  6. Hypertension:Presented with low blood pressures and received IV fluids for possible dehydration in the setting of chronic HCTZ/losartan use. He will continue metoprolol at reduced dosage for heart rate control, however discontinued other antihypertensives. Unable to check orthostatics per nurse as patient afraid of standing.  Support stockings in place   7.Chronic atrial fibrillation: Currently rate controlled on metoprolol XL 25 mg.  Anticoagulants discontinued in recent admission given recent trauma/internal bleeding.  Patient remains at risk for falls.  8. Dysphagia/Aspiration risk:He was seen by speech therapy in the past, had a speech evaluation and modified barium swallow. Patient aspirates on all consistencies. Patient is not a good candidate for artificial feeding and he does not want to eat. Patient however wants to eat as much he likes. Patient will aspirate, so he will need supervised feeding and all dysphagia precautions. He has agreed on dysphagia 2 diet with nectar thick liquid.   9. Chronic leg edema: Patient states mostly positional and tends to swell when he is up on his feet.  Likely multifactorial in the setting of mild LVH/RVH, possible venous stasis as well as hypoalbuminemia.  Of note patient was also noted to have small effusions on admission CT chest. He now has support stockings in place.Dopplers negative for DVT.  HCTZ held on admission.  Advised to use Lasix as needed upon discharge at SNF  10. Moderate Protein calorie malnutrition : Albumin level 2.4-3.1 over the last month. Dietary eval for protein supplements.   11.  Recent fall: Resulted in rib fractures/kidney laceration: S/p embolization.  Off anticoagulants.  No further falls but remains at risk due to limited ambulation/balance issues-he  states he feels like his knees and legs give away.  Has been afraid to stand and reluctant to work with PT while here.  PT recommended transfer back to SNF for ongoing rehab.According to friend, patient was mostly chair bound and transferred with assist even before prior admission.  12. Goals of care.: Seen byPalliative careand recommended transfer back toSNFrehab with ultimate goal to go back home. Hard of hearing but oriented. Full code. Advise palliative care follow-up at SNF  Discharge Exam:  Vitals:   10/07/19 2025 10/08/19 0433  BP: 116/78 103/76  Pulse: 84 98  Resp: 20 20  Temp: 98 F (36.7 C) 98.1 F (36.7 C)  SpO2: 94% 92%   Vitals:   10/07/19 1016 10/07/19 1341 10/07/19 2025 10/08/19 0433  BP: 129/75 122/74 116/78 103/76  Pulse: 92 96 84 98  Resp:  _0 Temp:  98.1 F (36.7 C) 98 F (36.7 C) 98.1 F (36.7 C)  TempSrc:  Oral Oral Oral  SpO2: 96% 94% 94% 92%  Weight:      Height:        General: Pt is alert, awake, not in acute distress Cardiovascular: RRR, S1/S2 +, no rubs, no gallops Respiratory: CTA bilaterally, decreased breath sounds at bases left greater than right, no wheezing Abdominal: Soft, NT, ND, bowel sounds + Extremities: Trace pitting edema, no cyanosis, on support stockings  Discharge Condition:Stable CODE STATUS: Full code Diet recommendation: Low-salt, low-fat diet Recommendations for Outpatient Follow-up:  1. Follow up with PCP: At skilled nursing facility in 3 days 2. Follow up with consultants: Palliative care at skilled nursing facility 3. Please obtain follow up labs including: BMP in 3 days.  Please follow-up PTH/vitamin D levels   Discharge Instructions:  Discharge Instructions    (HEART FAILURE PATIENTS) Call MD:  Anytime you have any of the following symptoms: 1) 3 pound weight gain in 24 hours or 5 pounds in 1 week 2) shortness of breath, with or without a dry hacking cough 3) swelling in the hands, feet or stomach 4) if  you have to sleep on extra pillows at night in order to breathe.   Complete by: As directed    Call MD for:  difficulty breathing, headache or visual disturbances   Complete by: As directed    Call MD for:  difficulty breathing, headache or visual disturbances   Complete by: As directed    Call MD for:  persistant dizziness or light-headedness   Complete by: As directed    Call MD for:  severe uncontrolled pain   Complete by: As directed    Call MD for:  severe uncontrolled pain   Complete by: As directed    Call MD for:  temperature >100.4   Complete by: As directed    Diet - low sodium heart healthy   Complete by: As directed    Discharge instructions   Complete by: As directed    Support stockings during the day   Increase activity slowly   Complete by: As directed    Increase activity slowly   Complete by: As directed      Allergies as of 10/08/2019      Reactions   Penicillins Other (See Comments)   Pt reports that he has tolerated oral PCN before but reports that his arm swelled where he received a PCN shot. Did it involve swelling of the face/tongue/throat,  SOB, or low BP? Unknown Did it involve sudden or severe rash/hives, skin peeling, or any reaction on the inside of your mouth or nose? Unknown Did you need to seek medical attention at a hospital or doctor's office? Unknown When did it last happen? unk If all above answers are "NO", may proceed with cephalosporin use.      Medication List    STOP taking these medications   losartan-hydrochlorothiazide 100-25 MG tablet Commonly known as: HYZAAR   traMADol 50 MG tablet Commonly known as: ULTRAM     TAKE these medications   acetaminophen 325 MG tablet Commonly known as: TYLENOL Take 2 tablets (650 mg total) by mouth every 6 (six) hours. What changed:   when to take this  reasons to take this   albuterol (2.5 MG/3ML) 0.083% nebulizer solution Commonly known as: PROVENTIL Take 2.5 mg by nebulization  every 6 (six) hours as needed for wheezing or shortness of breath.   atorvastatin 80 MG tablet Commonly known as: LIPITOR Take 80 mg by mouth daily.   docusate sodium 100 MG capsule Commonly known as: COLACE Take 1 capsule (100 mg total) by mouth 2 (two) times daily.   feeding supplement (ENSURE ENLIVE) Liqd Take 237 mLs by mouth 2 (two) times daily between meals.   furosemide 20 MG tablet Commonly known as: Lasix Take 1 tablet (20 mg total) by mouth daily as needed for fluid or edema.   glycopyrrolate 1 MG tablet Commonly known as: ROBINUL Take 1 tablet (1 mg total) by mouth 2 (two) times daily.   guaiFENesin 600 MG 12 hr tablet Commonly known as: MUCINEX Take 1 tablet (600 mg total) by mouth 2 (two) times daily as needed for to loosen phlegm.   methocarbamol 500 MG tablet Commonly known as: ROBAXIN Take 1 tablet (500 mg total) by mouth every 8 (eight) hours as needed for muscle spasms.   metoprolol succinate 25 MG 24 hr tablet Commonly known as: TOPROL-XL Take 1 tablet (25 mg total) by mouth daily. Take with or immediately following a meal. What changed:   medication strength  how much to take   multivitamin with minerals Tabs tablet Take 1 tablet by mouth daily.   polyethylene glycol 17 g packet Commonly known as: MIRALAX / GLYCOLAX Take 17 g by mouth daily.   Resource ThickenUp Clear Powd Take 1 g by mouth as needed.       Allergies  Allergen Reactions  . Penicillins Other (See Comments)    Pt reports that he has tolerated oral PCN before but reports that his arm swelled where he received a PCN shot. Did it involve swelling of the face/tongue/throat, SOB, or low BP? Unknown Did it involve sudden or severe rash/hives, skin peeling, or any reaction on the inside of your mouth or nose? Unknown Did you need to seek medical attention at a hospital or doctor's office? Unknown When did it last happen? unk If all above answers are "NO", may proceed with  cephalosporin use.       The results of significant diagnostics from this hospitalization (including imaging, microbiology, ancillary and laboratory) are listed below for reference.    Labs: BNP (last 3 results) Recent Labs    10/06/19 1852  BNP 213.0*   Basic Metabolic Panel: Recent Labs  Lab 10/02/19 0450 10/04/19 1609 10/07/19 0857 10/08/19 1127  NA 135 135 136 135  K 4.2 4.0 3.9 3.9  CL 101 101 100 100  CO2 _0 GLUCOSE  120* 142* 139* 126*  BUN 44* 33* 31* 31*  CREATININE 1.23 1.11 1.19 1.20  CALCIUM 8.5* 8.2* 8.3* 8.4*  MG 2.1  --   --   --    Liver Function Tests: Recent Labs  Lab 10/08/19 1127  AST 33  ALT 26  ALKPHOS 83  BILITOT 1.5*  PROT 5.8*  ALBUMIN 2.4*   No results for input(s): LIPASE, AMYLASE in the last 168 hours. No results for input(s): AMMONIA in the last 168 hours. CBC: Recent Labs  Lab 10/02/19 0450 10/07/19 0857  WBC 10.1 7.7  NEUTROABS 7.6  --   HGB 11.4* 11.6*  HCT 37.0* 37.2*  MCV 103.1* 102.5*  PLT 369 369   Cardiac Enzymes: No results for input(s): CKTOTAL, CKMB, CKMBINDEX, TROPONINI in the last 168 hours. BNP: Invalid input(s): POCBNP CBG: No results for input(s): GLUCAP in the last 168 hours. D-Dimer No results for input(s): DDIMER in the last 72 hours. Hgb A1c No results for input(s): HGBA1C in the last 72 hours. Lipid Profile No results for input(s): CHOL, HDL, LDLCALC, TRIG, CHOLHDL, LDLDIRECT in the last 72 hours. Thyroid function studies No results for input(s): TSH, T4TOTAL, T3FREE, THYROIDAB in the last 72 hours.  Invalid input(s): FREET3 Anemia work up No results for input(s): VITAMINB12, FOLATE, FERRITIN, TIBC, IRON, RETICCTPCT in the last 72 hours. Urinalysis    Component Value Date/Time   COLORURINE YELLOW 10/01/2019 0825   APPEARANCEUR CLEAR 10/01/2019 0825   LABSPEC 1.024 10/01/2019 0825   PHURINE 5.0 10/01/2019 0825   GLUCOSEU NEGATIVE 10/01/2019 0825   HGBUR NEGATIVE 10/01/2019 0825    BILIRUBINUR NEGATIVE 10/01/2019 0825   KETONESUR NEGATIVE 10/01/2019 0825   PROTEINUR NEGATIVE 10/01/2019 0825   NITRITE NEGATIVE 10/01/2019 0825   LEUKOCYTESUR NEGATIVE 10/01/2019 0825   Sepsis Labs Invalid input(s): PROCALCITONIN,  WBC,  LACTICIDVEN Microbiology Recent Results (from the past 240 hour(s))  Blood Culture (routine x 2)     Status: None   Collection Time: 10/01/19 12:11 AM   Specimen: BLOOD RIGHT FOREARM  Result Value Ref Range Status   Specimen Description   Final    BLOOD RIGHT FOREARM Performed at [facility], [address] [place], [place] [address]    Special Requests   Final    BOTTLES DRAWN AEROBIC AND ANAEROBIC Blood Culture results may not be optimal due to an inadequate volume of blood received in culture bottles Performed at [facility] [address], [place], [place] [address]    Culture   Final    NO GROWTH 5 DAYS Performed at [facility], [place] [address], [place], [place] [address]    Report Status 10/06/2019 FINAL  Final  SARS CORONAVIRUS 2 (TAT 6-24 HRS) Nasopharyngeal Nasopharyngeal Swab     Status: None   Collection Time: 10/01/19 12:13 AM   Specimen: Nasopharyngeal Swab  Result Value Ref Range Status   SARS Coronavirus 2 NEGATIVE NEGATIVE Final    Comment: (NOTE) SARS-CoV-2 target nucleic acids are NOT DETECTED. The SARS-CoV-2 RNA is generally detectable in upper and lower respiratory specimens during the acute phase of infection. Negative results do not preclude SARS-CoV-2 infection, do not rule out co-infections with other pathogens, and should not be used as the sole basis for treatment or other patient management decisions. Negative results must be combined with clinical observations, patient history, and epidemiological information. The expected result is Negative. Fact Sheet for Patients: SugarRoll.be Fact Sheet for Healthcare  Providers: https://www.woods-mathews.com/ This test is  not yet approved or cleared by the [place] and  has been authorized for detection and/or diagnosis of SARS-CoV-2 by FDA under an Emergency Use Authorization (EUA). This EUA will remain  in effect (meaning this test can be used) for the duration of the COVID-19 declaration under Section 56 4(b)(1) of the Act, 21 U.S.C. section 360bbb-3(b)(1), unless the authorization is terminated or revoked sooner. Performed at [facility], [place] [address], [place], [place] [address]   Blood Culture (routine x 2)     Status: None   Collection Time: 10/01/19 12:16 AM   Specimen: BLOOD LEFT WRIST  Result Value Ref Range Status   Specimen Description   Final    BLOOD LEFT WRIST Performed at [facility] [address], [place], [place] [address]    Special Requests   Final    BOTTLES DRAWN AEROBIC AND ANAEROBIC Blood Culture adequate volume Performed at [facility] [address], [place], [place] [address]    Culture   Final    NO GROWTH 5 DAYS Performed at [facility], [place] [address] [place], [place] [address]    Report Status 10/06/2019 FINAL  Final  Urine culture     Status: Abnormal   Collection Time: 10/01/19  8:25 AM   Specimen: In/Out Cath Urine  Result Value Ref Range Status   Specimen Description   Final    IN/OUT CATH URINE Performed at [facility] [address], [place], [place] [address]    Special Requests   Final    NONE Performed at [facility], [place] [address] [place], [place] [address]    Culture (A)  Final    <10,000 COLONIES/mL INSIGNIFICANT GROWTH Performed at [facility] [address], [place], [place] [address]    Report Status 10/02/2019 FINAL  Final  SARS CORONAVIRUS 2 (TAT 6-24 HRS) Nasopharyngeal Nasopharyngeal Swab     Status: None   Collection Time: 10/05/19 11:44 AM   Specimen: Nasopharyngeal  Swab  Result Value Ref Range Status   SARS Coronavirus 2 NEGATIVE NEGATIVE Final    Comment: (NOTE) SARS-CoV-2 target nucleic acids are NOT DETECTED. The SARS-CoV-2 RNA is generally detectable in upper and lower respiratory specimens during the acute phase of infection. Negative results do not preclude SARS-CoV-2 infection, do not rule out co-infections with other pathogens, and should not be used as the sole basis for treatment or other patient management decisions. Negative results must be combined with clinical observations, patient history, and epidemiological information. The expected result is Negative. Fact Sheet for Patients: SugarRoll.be Fact Sheet for Healthcare Providers: https://www.woods-mathews.com/ This test is not yet approved or cleared by the [place] FDA and  has been authorized for detection and/or diagnosis of SARS-CoV-2 by FDA under an Emergency Use Authorization (EUA). This EUA will remain  in effect (meaning this test can be used) for the duration of the COVID-19 declaration under Section 56 4(b)(1) of the Act, 21 U.S.C. section 360bbb-3(b)(1), unless the authorization is terminated or revoked sooner. Performed at [facility], [place] [address] [place], [place] [address]     Procedures/Studies: Ct Head Wo Contrast  Result Date: 09/15/2019 CLINICAL DATA:  Trauma, fall EXAM: CT HEAD WITHOUT CONTRAST CT CERVICAL SPINE WITHOUT CONTRAST TECHNIQUE: Multidetector CT imaging of the head and cervical spine was performed following the standard protocol without intravenous contrast. Multiplanar CT image reconstructions of the cervical spine were also generated. COMPARISON:  None. FINDINGS: CT HEAD FINDINGS Brain: No evidence of acute infarction, hemorrhage, hydrocephalus, extra-axial collection or mass lesion/mass effect. This periventricular and deep white matter hypodensity with global volume loss. Vascular: No  hyperdense vessel or unexpected calcification. Skull: Normal. Negative for fracture or focal lesion. Sinuses/Orbits: No acute finding. Other: None. CT CERVICAL SPINE FINDINGS Alignment: Degenerative straightening of the normal cervical lordosis. Skull base and vertebrae: No acute fracture. No primary bone lesion or focal pathologic process. Soft tissues and spinal canal: No prevertebral fluid or swelling. No visible canal hematoma. Disc levels: Severe multilevel disc degenerative disease and partial ankylosis of the cervical spine. Upper chest: Negative. Other: Aortic atherosclerosis. IMPRESSION: 1. No acute intracranial pathology. Small-vessel white matter disease and global volume loss in keeping with advanced patient age. 2. No fracture or static subluxation of the cervical spine. Severe multilevel disc degenerative disease. 3.  Aortic Atherosclerosis (ICD10-I70.0). Electronically Signed   By: Eddie Candle M.D.   On: 09/15/2019 12:20   Ct Chest W Contrast  Result Date: 09/15/2019 CLINICAL DATA:  Chest trauma, fall, pain in ribs and back, history of colon cancer EXAM: CT CHEST, ABDOMEN, AND PELVIS WITH CONTRAST TECHNIQUE: Multidetector CT imaging of the chest, abdomen and pelvis was performed following the standard protocol during bolus administration of intravenous contrast. CONTRAST:  22m OMNIPAQUE IOHEXOL 300 MG/ML  SOLN COMPARISON:  None. FINDINGS: CT CHEST FINDINGS Cardiovascular: Aortic atherosclerosis. Cardiomegaly. Three-vessel coronary artery calcifications. No pericardial effusion. Mediastinum/Nodes: No enlarged mediastinal, hilar, or axillary lymph nodes. Thyroid gland, trachea, and esophagus demonstrate no significant findings. Lungs/Pleura: Lungs are clear. Large left-sided diaphragmatic eventration. Trace left pleural effusion. Musculoskeletal: No chest wall mass or suspicious bone lesions identified. There are minimally displaced fractures of the lateral and posterior left seventh through  eleventh ribs. CT ABDOMEN PELVIS FINDINGS Hepatobiliary: No solid liver abnormality is seen. Gallstones. No gallbladder wall thickening, or biliary dilatation. Pancreas: Unremarkable. No pancreatic ductal dilatation or surrounding inflammatory changes. Spleen: Normal in size without significant abnormality. Adrenals/Urinary Tract: Adrenal glands are unremarkable. There is a laceration and large hematoma of the inferior pole of the left kidney with multiple internal foci of contrast attenuation concerning for active extravasation (series 3, image 63, 69). There is extensive fluid and contrast in the perirenal fascia and retroperitoneal soft tissues overlying the psoas. Bladder is unremarkable. Stomach/Bowel: Stomach is within normal limits. No evidence of bowel wall thickening, distention, or inflammatory changes. Evidence of prior sigmoid resection and reanastomosis Vascular/Lymphatic: Aortic atherosclerosis. Infrarenal abdominal aortic aneurysm measuring 4.2 x 4.1 cm. No enlarged abdominal or pelvic lymph nodes. Reproductive: No mass or other abnormality. Other: There is a midline ventral hernia containing nonobstructed loops of small bowel, hernia sac measuring 11.2 x 3.7 cm and neck measuring 4.8 cm (series 3, image 93, series 7, image 112). No abdominopelvic ascites. Musculoskeletal: No acute or significant osseous findings. IMPRESSION: 1. There is a laceration and large hematoma of the inferior pole of the left kidney with multiple internal foci of contrast attenuation concerning for active extravasation (series 3, image 63, 69). There is extensive fluid and contrast in the perirenal fascia and retroperitoneal soft tissues overlying the psoas. 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 2. There are minimally displaced fractures of the overlying lateral and posterior left seventh through eleventh ribs. 3.  Trace left pleural effusion. 4. 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 5.  There is a midline ventral hernia containing nonobstructed loops of small bowel, hernia sac measuring 11.2 x 3.7 cm and neck measuring 4.8 cm (series 3, image 93, series 7, image 112). 6. Infrarenal abdominal aortic aneurysm measuring 4.2 x 4.1 cm. Aortic Atherosclerosis (ICD10-I70.0). 7.  Cardiomegaly and coronary artery disease. 8.  Evidence of prior sigmoid colon resection and reanastomosis. 9.  Cholelithiasis. These results were called by telephone at the time of interpretation on 09/15/2019 at 12:44 pm to provider Veryl Speak , who verbally acknowledged these results. Electronically Signed   By: Eddie Candle M.D.   On: 09/15/2019 12:49   Ct Cervical Spine Wo Contrast  Result Date: 09/15/2019 CLINICAL DATA:  Trauma, fall EXAM: CT HEAD WITHOUT CONTRAST CT CERVICAL SPINE WITHOUT CONTRAST TECHNIQUE: Multidetector CT imaging of the head and cervical spine was performed following the standard protocol without intravenous contrast. Multiplanar CT image reconstructions of the cervical spine were also generated. COMPARISON:  None. FINDINGS: CT HEAD FINDINGS Brain: No evidence of acute infarction, hemorrhage, hydrocephalus, extra-axial collection or mass lesion/mass effect. This periventricular and deep white matter hypodensity with global volume loss. Vascular: No hyperdense vessel or unexpected calcification. Skull: Normal. Negative for fracture or focal lesion. Sinuses/Orbits: No acute finding. Other: None. CT CERVICAL SPINE FINDINGS Alignment: Degenerative straightening of the normal cervical lordosis. Skull base and vertebrae: No acute fracture. No primary bone lesion or focal pathologic process. Soft tissues and spinal canal: No prevertebral fluid or swelling. No visible canal hematoma. Disc levels: Severe multilevel disc degenerative disease and partial ankylosis of the cervical spine. Upper  chest: Negative. Other: Aortic atherosclerosis. IMPRESSION: 1. No acute intracranial pathology. Small-vessel white matter disease and global volume loss in keeping with advanced patient age. 2. No fracture or static subluxation of the cervical spine. Severe multilevel disc degenerative disease. 3.  Aortic Atherosclerosis (ICD10-I70.0). Electronically Signed   By: Eddie Candle M.D.   On: 09/15/2019 12:20   Ct Abdomen Pelvis W Contrast  Result Date: 10/01/2019 CLINICAL DATA:  Abdominal pain, history of recent renal injury on the left initial encounter EXAM: CT ABDOMEN AND PELVIS WITH CONTRAST TECHNIQUE: Multidetector CT imaging of the abdomen and pelvis was performed using the standard protocol following bolus administration of intravenous contrast. CONTRAST:  92m OMNIPAQUE IOHEXOL 300 MG/ML  SOLN COMPARISON:  09/15/2019 FINDINGS: Lower chest: Small left pleural effusion is noted with associated atelectatic changes slightly larger than that seen on the prior exam. Small right-sided pleural effusion is noted with atelectatic changes as well. Hepatobiliary: Multiple gallstones are noted within the gallbladder. No wall thickening or pericholecystic fluid is noted. The liver is within normal limits and stable. Pancreas: Unremarkable. No pancreatic ductal dilatation or surrounding inflammatory changes. Spleen: Normal in size without focal abnormality. Adrenals/Urinary Tract: Adrenal glands are within normal limits. Right kidney demonstrates a normal enhancement pattern with cystic change stable in appearance from the prior exam. No renal calculi or obstructive changes are noted. The bladder is partially distended. The left kidney again demonstrates subcapsular hematoma. This measures approximately 8.7 x 3.4 cm in dimension with mixed attenuation consistent with a more subacute process. No acute hemorrhage is seen. Mass effect upon the left kidney is noted with some decreased enhancement seen in the mid to lower pole  posteriorly stable from the prior study. Changes of prior renal artery embolization are noted. Perinephric stranding on the left is seen. Area of previous hemorrhage along the left psoas muscle is noted. This is stable from the prior exam. Stomach/Bowel: Mild retained  fecal material is noted within the colon. No obstructive changes are seen. The appendix is not well visualized. No inflammatory changes are seen. There are multiple loops of small bowel identified within an anterior abdominal wall hernia stable from the prior exam. No obstructive changes are seen. The stomach is within normal limits. Vascular/Lymphatic: Aortic atherosclerosis. 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 No obstructive changes are seen. No enlarged abdominal or pelvic lymph nodes. Reproductive: Prostate is unremarkable. Other: No abdominal wall hernia or abnormality. No abdominopelvic ascites. Musculoskeletal: Degenerative changes of lumbar spine are noted. Fractures of the posterolateral aspects of the left ninth, tenth and eleventh ribs are seen better visualized on than on the prior exam. IMPRESSION: Changes consistent with the known prior left perirenal hematoma. Mass effect on the left kidney is noted with decreased perfusion inferiorly which may represent changes of Page kidney. No findings to suggest active extravasation are noted. Prior embolization is seen. Stable dilatation of the infrarenal aorta. Anterior abdominal wall hernia with multiple loops of small bowel are noted within no obstructive changes are noted. Fractures of the left ninth through eleventh ribs posteriorly. Cholelithiasis. Bilateral pleural effusions left greater than right. Electronically Signed   By: Inez Catalina M.D.   On: 10/01/2019 02:04   Ct Abdomen Pelvis W Contrast  Result Date: 09/15/2019 CLINICAL DATA:  Chest trauma, fall, pain in ribs and back, history of colon cancer EXAM: CT CHEST, ABDOMEN, AND PELVIS WITH CONTRAST  TECHNIQUE: Multidetector CT imaging of the chest, abdomen and pelvis was performed following the standard protocol during bolus administration of intravenous contrast. CONTRAST:  14m OMNIPAQUE IOHEXOL 300 MG/ML  SOLN COMPARISON:  None. FINDINGS: CT CHEST FINDINGS Cardiovascular: Aortic atherosclerosis. Cardiomegaly. Three-vessel coronary artery calcifications. No pericardial effusion. Mediastinum/Nodes: No enlarged mediastinal, hilar, or axillary lymph nodes. Thyroid gland, trachea, and esophagus demonstrate no significant findings. Lungs/Pleura: Lungs are clear. Large left-sided diaphragmatic eventration. Trace left pleural effusion. Musculoskeletal: No chest wall mass or suspicious bone lesions identified. There are minimally displaced fractures of the lateral and posterior left seventh through eleventh ribs. CT ABDOMEN PELVIS FINDINGS Hepatobiliary: No solid liver abnormality is seen. Gallstones. No gallbladder wall thickening, or biliary dilatation. Pancreas: Unremarkable. No pancreatic ductal dilatation or surrounding inflammatory changes. Spleen: Normal in size without significant abnormality. Adrenals/Urinary Tract: Adrenal glands are unremarkable. There is a laceration and large hematoma of the inferior pole of the left kidney with multiple internal foci of contrast attenuation concerning for active extravasation (series 3, image 63, 69). There is extensive fluid and contrast in the perirenal fascia and retroperitoneal soft tissues overlying the psoas. Bladder is unremarkable. Stomach/Bowel: Stomach is within normal limits. No evidence of bowel wall thickening, distention, or inflammatory changes. Evidence of prior sigmoid resection and reanastomosis Vascular/Lymphatic: Aortic atherosclerosis. Infrarenal abdominal aortic aneurysm measuring 4.2 x 4.1 cm. No enlarged abdominal or pelvic lymph nodes. Reproductive: No mass or other abnormality. Other: There is a midline ventral hernia containing nonobstructed  loops of small bowel, hernia sac measuring 11.2 x 3.7 cm and neck measuring 4.8 cm (series 3, image 93, series 7, image 112). No abdominopelvic ascites. Musculoskeletal: No acute or significant osseous findings. IMPRESSION: 1. There is a laceration and large hematoma of the inferior pole of the left kidney with multiple internal foci of contrast attenuation concerning for active extravasation (series 3, image 63, 69). There is extensive fluid and contrast in the perirenal fascia and retroperitoneal soft tissues overlying the psoas. Findings are consistent with AAST grade  III to grade IV injury. 2. There are minimally displaced fractures of the overlying lateral and posterior left seventh through eleventh ribs. 3.  Trace left pleural effusion. 4. 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 5. There is a midline ventral hernia containing nonobstructed loops of small bowel, hernia sac measuring 11.2 x 3.7 cm and neck measuring 4.8 cm (series 3, image 93, series 7, image 112). 6. Infrarenal abdominal aortic aneurysm measuring 4.2 x 4.1 cm. Aortic Atherosclerosis (ICD10-I70.0). 7.  Cardiomegaly and coronary artery disease. 8.  Evidence of prior sigmoid colon resection and reanastomosis. 9.  Cholelithiasis. These results were called by telephone at the time of interpretation on 09/15/2019 at 12:44 pm to provider Veryl Speak , who verbally acknowledged these results. Electronically Signed   By: Eddie Candle M.D.   On: 09/15/2019 12:49   Ir Angiogram Renal Uni Selective  Result Date: 09/15/2019 INDICATION: Fall on anticoagulation, now with left-sided renal fractures and left renal laceration. Request made for left renal arteriogram and potential embolization. Request also made for placement of a image guided central venous catheter for durable intravenous access. EXAM: 1. ULTRASOUND GUIDANCE FOR  VENOUS ACCESS 2. FLUOROSCOPIC GUIDED PLACEMENT OF A NON TUNNELED TRIPLE-LUMEN CENTRAL VENOUS CATHETER. 3. ULTRASOUND GUIDANCE FOR ARTERIAL ACCESS 4. FLUSH ABDOMINAL AORTOGRAM 5. SELECTIVE LEFT RENAL ARTERIOGRAM 6. SUB SELECTIVE LEFT ARTERIOGRAM AND PERCUTANEOUS COIL EMBOLIZATION COMPARISON:  CT the chest, abdomen and pelvis - earlier same day MEDICATIONS: NONE ANESTHESIA/SEDATION: Moderate (conscious) sedation was employed during this procedure. A total of Versed 2 mg and Fentanyl 100 mcg was administered intravenously. Moderate Sedation Time: 85 minutes. The patient's level of consciousness and vital signs were monitored continuously by radiology nursing throughout the procedure under my direct supervision. CONTRAST:  75 cc Omnipaque 300 FLUOROSCOPY TIME:  24 minutes (7,412 mGy) COMPLICATIONS: None immediate. PROCEDURE: Informed consent was obtained from the patient following explanation of the procedure, risks, benefits and alternatives. The patient understands, agrees and consents for the procedure. All questions were addressed. A time out was performed prior to the initiation of the procedure. Maximal barrier sterile technique utilized including caps, mask, sterile gowns, sterile gloves, large sterile drape, hand hygiene, and Betadine prep. Attention was initially paid towards placement of a non tunneled central venous catheter. Under direct ultrasound guidance, the left internal jugular vein was accessed with a micropuncture kit. A micropuncture wire was utilized for measurement purposes. Under fluoroscopic guidance, a peel-away sheath was placed and a 15 cm triple-lumen non tunneled central venous catheter was inserted with tip terminating at the level the superior cavoatrial junction. Postprocedural spot fluoroscopic radiographic image was obtained. The central venous catheter was secured at the neck entrance site within interrupted suture. A dressing was applied.  _________________________________________________________ The right femoral head was marked fluoroscopically. Under ultrasound guidance, the right common femoral artery was accessed with a micropuncture kit after the overlying soft tissues were anesthetized with 1% lidocaine. An ultrasound image was saved for documentation purposes. The micropuncture sheath was exchanged for a 5 [place] vascular sheath over a Bentson wire. A closure arteriogram was performed through the side of the sheath confirming access within the right common femoral artery. Over a Bentson wire, a Mickelson catheter was advanced to the level of the inferior aspect of the thoracic aorta where it was back bled and flushed. The catheter was then utilized to select the left renal artery and a selective left  renal arteriogram was performed. Next, with the use of a fathom 14 microcatheter, a regular Renegade microcatheter was advanced into a midpole division of the left renal artery. Sub selective arteriogram was performed and the vessel was percutaneously coil embolized with multiple overlapping 2 mm, 3 mm and 4 mm diameter interlock coils. Next, the microcatheter was utilized to an additional midpole division of the left renal artery and a sub selective arteriogram was performed. Next, the vessel was percutaneously coil embolized with multiple overlapping 4 mm, 5 mm and 6 mm interlock coils. The microcatheter was retracted to the level of the main left renal artery and selective left arteriogram was performed. The microcatheter was removed and a completion arteriogram was performed via the [facility] catheter. Next, prolonged efforts were made to cannulate the known tiny accessory left renal artery however this ultimately proved difficult. As such, a flush aortogram was performed demonstrating patency of the accessory left renal artery. Unfortunately, despite prolonged efforts, the tiny accessory left renal artery could not be selectively cannulated,  likely secondary to a combination of suspected narrowing involving the vessel's origin (as was demonstrated on preceding CT) as well as the patient's aneurysmal and tortuous abdominal aorta. At this point, the procedure was terminated. All wires, catheters and sheaths were removed patient. Hemostasis was achieved at the right groin access site with deployment of an ExoSeal closure device and manual compression. A dressing applied. The patient tolerated the procedure well without immediate postprocedural complication. FINDINGS: Selective left renal arteriogram demonstrates marked irregularity and contrast extravasation arising from several midpole subsegmental renal arteries. As such, both co-dominant midpole segmental arteries were sub selective and percutaneously coil embolized. Completion arteriogram demonstrates a technically excellent result without evidence of residual contrast extravasation or vessel irregularity. Completion left renal arteriogram demonstrates patency of several superior pole segmental divisions without additional area contrast extravasation or vessel irregularity. Flush abdominal aortogram demonstrates patency of the known accessory tiny left renal artery. Despite prolonged efforts, the accessory left renal artery could not be selectively cannulated, likely secondary to a combination of suspected narrowing of the vessel's origin (as was demonstrated on preceding CT), as well as the patient's aneurysmal and tortuous abdominal aorta. IMPRESSION: 1. Technically successful percutaneous coil embolization of two mid pole segmental arteries supplying ill-defined areas of contrast extravasation and vessel irregularity. 2. Attempted, though unsuccessful, cannulation of the known tiny left accessory renal artery likely to secondary to a combination of suspected narrowing of the vessel's origin (as was demonstrated on preceding CT) as well as the patient's aneurysmal and tortuous abdominal aorta. 3.  Successful image guided placement of a non tunneled right triple-lumen central venous catheter with tip terminating within the superior cavoatrial junction. The central venous catheter is ready for immediate use. Electronically Signed   By: Sandi Mariscal M.D.   On: 09/15/2019 16:52   Ir Fluoro Guide Cv Line Right  Result Date: 09/15/2019 INDICATION: Fall on anticoagulation, now with left-sided renal fractures and left renal laceration. Request made for left renal arteriogram and potential embolization. Request also made for placement of a image guided central venous catheter for durable intravenous access. EXAM: 1. ULTRASOUND GUIDANCE FOR VENOUS ACCESS 2. FLUOROSCOPIC GUIDED PLACEMENT OF A NON TUNNELED TRIPLE-LUMEN CENTRAL VENOUS CATHETER. 3. ULTRASOUND GUIDANCE FOR ARTERIAL ACCESS 4. FLUSH ABDOMINAL AORTOGRAM 5. SELECTIVE LEFT RENAL ARTERIOGRAM 6. SUB SELECTIVE LEFT ARTERIOGRAM AND PERCUTANEOUS COIL EMBOLIZATION COMPARISON:  CT the chest, abdomen and pelvis - earlier same day MEDICATIONS: NONE ANESTHESIA/SEDATION: Moderate (conscious) sedation was employed during this procedure.  A total of Versed 2 mg and Fentanyl 100 mcg was administered intravenously. Moderate Sedation Time: 85 minutes. The patient's level of consciousness and vital signs were monitored continuously by radiology nursing throughout the procedure under my direct supervision. CONTRAST:  75 cc Omnipaque 300 FLUOROSCOPY TIME:  24 minutes (6,283 mGy) COMPLICATIONS: None immediate. PROCEDURE: Informed consent was obtained from the patient following explanation of the procedure, risks, benefits and alternatives. The patient understands, agrees and consents for the procedure. All questions were addressed. A time out was performed prior to the initiation of the procedure. Maximal barrier sterile technique utilized including caps, mask, sterile gowns, sterile gloves, large sterile drape, hand hygiene, and Betadine prep. Attention was initially paid  towards placement of a non tunneled central venous catheter. Under direct ultrasound guidance, the left internal jugular vein was accessed with a micropuncture kit. A micropuncture wire was utilized for measurement purposes. Under fluoroscopic guidance, a peel-away sheath was placed and a 15 cm triple-lumen non tunneled central venous catheter was inserted with tip terminating at the level the superior cavoatrial junction. Postprocedural spot fluoroscopic radiographic image was obtained. The central venous catheter was secured at the neck entrance site within interrupted suture. A dressing was applied. _________________________________________________________ The right femoral head was marked fluoroscopically. Under ultrasound guidance, the right common femoral artery was accessed with a micropuncture kit after the overlying soft tissues were anesthetized with 1% lidocaine. An ultrasound image was saved for documentation purposes. The micropuncture sheath was exchanged for a 5 [place] vascular sheath over a Bentson wire. A closure arteriogram was performed through the side of the sheath confirming access within the right common femoral artery. Over a Bentson wire, a Mickelson catheter was advanced to the level of the inferior aspect of the thoracic aorta where it was back bled and flushed. The catheter was then utilized to select the left renal artery and a selective left renal arteriogram was performed. Next, with the use of a fathom 14 microcatheter, a regular Renegade microcatheter was advanced into a midpole division of the left renal artery. Sub selective arteriogram was performed and the vessel was percutaneously coil embolized with multiple overlapping 2 mm, 3 mm and 4 mm diameter interlock coils. Next, the microcatheter was utilized to an additional midpole division of the left renal artery and a sub selective arteriogram was performed. Next, the vessel was percutaneously coil embolized with multiple  overlapping 4 mm, 5 mm and 6 mm interlock coils. The microcatheter was retracted to the level of the main left renal artery and selective left arteriogram was performed. The microcatheter was removed and a completion arteriogram was performed via the [facility] catheter. Next, prolonged efforts were made to cannulate the known tiny accessory left renal artery however this ultimately proved difficult. As such, a flush aortogram was performed demonstrating patency of the accessory left renal artery. Unfortunately, despite prolonged efforts, the tiny accessory left renal artery could not be selectively cannulated, likely secondary to a combination of suspected narrowing involving the vessel's origin (as was demonstrated on preceding CT) as well as the patient's aneurysmal and tortuous abdominal aorta. At this point, the procedure was terminated. All wires, catheters and sheaths were removed patient. Hemostasis was achieved at the right groin access site with deployment of an ExoSeal closure device and manual compression. A dressing applied. The patient tolerated the procedure well without immediate postprocedural complication. FINDINGS: Selective left renal arteriogram demonstrates marked irregularity and contrast extravasation arising from several midpole subsegmental renal arteries. As such, both co-dominant midpole  segmental arteries were sub selective and percutaneously coil embolized. Completion arteriogram demonstrates a technically excellent result without evidence of residual contrast extravasation or vessel irregularity. Completion left renal arteriogram demonstrates patency of several superior pole segmental divisions without additional area contrast extravasation or vessel irregularity. Flush abdominal aortogram demonstrates patency of the known accessory tiny left renal artery. Despite prolonged efforts, the accessory left renal artery could not be selectively cannulated, likely secondary to a combination of  suspected narrowing of the vessel's origin (as was demonstrated on preceding CT), as well as the patient's aneurysmal and tortuous abdominal aorta. IMPRESSION: 1. Technically successful percutaneous coil embolization of two mid pole segmental arteries supplying ill-defined areas of contrast extravasation and vessel irregularity. 2. Attempted, though unsuccessful, cannulation of the known tiny left accessory renal artery likely to secondary to a combination of suspected narrowing of the vessel's origin (as was demonstrated on preceding CT) as well as the patient's aneurysmal and tortuous abdominal aorta. 3. Successful image guided placement of a non tunneled right triple-lumen central venous catheter with tip terminating within the superior cavoatrial junction. The central venous catheter is ready for immediate use. Electronically Signed   By: Sandi Mariscal M.D.   On: 09/15/2019 16:52   Ir US Guide Vasc Access Right  Result Date: 09/15/2019 INDICATION: Fall on anticoagulation, now with left-sided renal fractures and left renal laceration. Request made for left renal arteriogram and potential embolization. Request also made for placement of a image guided central venous catheter for durable intravenous access. EXAM: 1. ULTRASOUND GUIDANCE FOR VENOUS ACCESS 2. FLUOROSCOPIC GUIDED PLACEMENT OF A NON TUNNELED TRIPLE-LUMEN CENTRAL VENOUS CATHETER. 3. ULTRASOUND GUIDANCE FOR ARTERIAL ACCESS 4. FLUSH ABDOMINAL AORTOGRAM 5. SELECTIVE LEFT RENAL ARTERIOGRAM 6. SUB SELECTIVE LEFT ARTERIOGRAM AND PERCUTANEOUS COIL EMBOLIZATION COMPARISON:  CT the chest, abdomen and pelvis - earlier same day MEDICATIONS: NONE ANESTHESIA/SEDATION: Moderate (conscious) sedation was employed during this procedure. A total of Versed 2 mg and Fentanyl 100 mcg was administered intravenously. Moderate Sedation Time: 85 minutes. The patient's level of consciousness and vital signs were monitored continuously by radiology nursing throughout the  procedure under my direct supervision. CONTRAST:  75 cc Omnipaque 300 FLUOROSCOPY TIME:  24 minutes (5,621 mGy) COMPLICATIONS: None immediate. PROCEDURE: Informed consent was obtained from the patient following explanation of the procedure, risks, benefits and alternatives. The patient understands, agrees and consents for the procedure. All questions were addressed. A time out was performed prior to the initiation of the procedure. Maximal barrier sterile technique utilized including caps, mask, sterile gowns, sterile gloves, large sterile drape, hand hygiene, and Betadine prep. Attention was initially paid towards placement of a non tunneled central venous catheter. Under direct ultrasound guidance, the left internal jugular vein was accessed with a micropuncture kit. A micropuncture wire was utilized for measurement purposes. Under fluoroscopic guidance, a peel-away sheath was placed and a 15 cm triple-lumen non tunneled central venous catheter was inserted with tip terminating at the level the superior cavoatrial junction. Postprocedural spot fluoroscopic radiographic image was obtained. The central venous catheter was secured at the neck entrance site within interrupted suture. A dressing was applied. _________________________________________________________ The right femoral head was marked fluoroscopically. Under ultrasound guidance, the right common femoral artery was accessed with a micropuncture kit after the overlying soft tissues were anesthetized with 1% lidocaine. An ultrasound image was saved for documentation purposes. The micropuncture sheath was exchanged for a 5 [place] vascular sheath over a Bentson wire. A closure arteriogram was performed through the side of the sheath  confirming access within the right common femoral artery. Over a Bentson wire, a Mickelson catheter was advanced to the level of the inferior aspect of the thoracic aorta where it was back bled and flushed. The catheter was then  utilized to select the left renal artery and a selective left renal arteriogram was performed. Next, with the use of a fathom 14 microcatheter, a regular Renegade microcatheter was advanced into a midpole division of the left renal artery. Sub selective arteriogram was performed and the vessel was percutaneously coil embolized with multiple overlapping 2 mm, 3 mm and 4 mm diameter interlock coils. Next, the microcatheter was utilized to an additional midpole division of the left renal artery and a sub selective arteriogram was performed. Next, the vessel was percutaneously coil embolized with multiple overlapping 4 mm, 5 mm and 6 mm interlock coils. The microcatheter was retracted to the level of the main left renal artery and selective left arteriogram was performed. The microcatheter was removed and a completion arteriogram was performed via the [facility] catheter. Next, prolonged efforts were made to cannulate the known tiny accessory left renal artery however this ultimately proved difficult. As such, a flush aortogram was performed demonstrating patency of the accessory left renal artery. Unfortunately, despite prolonged efforts, the tiny accessory left renal artery could not be selectively cannulated, likely secondary to a combination of suspected narrowing involving the vessel's origin (as was demonstrated on preceding CT) as well as the patient's aneurysmal and tortuous abdominal aorta. At this point, the procedure was terminated. All wires, catheters and sheaths were removed patient. Hemostasis was achieved at the right groin access site with deployment of an ExoSeal closure device and manual compression. A dressing applied. The patient tolerated the procedure well without immediate postprocedural complication. FINDINGS: Selective left renal arteriogram demonstrates marked irregularity and contrast extravasation arising from several midpole subsegmental renal arteries. As such, both co-dominant midpole  segmental arteries were sub selective and percutaneously coil embolized. Completion arteriogram demonstrates a technically excellent result without evidence of residual contrast extravasation or vessel irregularity. Completion left renal arteriogram demonstrates patency of several superior pole segmental divisions without additional area contrast extravasation or vessel irregularity. Flush abdominal aortogram demonstrates patency of the known accessory tiny left renal artery. Despite prolonged efforts, the accessory left renal artery could not be selectively cannulated, likely secondary to a combination of suspected narrowing of the vessel's origin (as was demonstrated on preceding CT), as well as the patient's aneurysmal and tortuous abdominal aorta. IMPRESSION: 1. Technically successful percutaneous coil embolization of two mid pole segmental arteries supplying ill-defined areas of contrast extravasation and vessel irregularity. 2. Attempted, though unsuccessful, cannulation of the known tiny left accessory renal artery likely to secondary to a combination of suspected narrowing of the vessel's origin (as was demonstrated on preceding CT) as well as the patient's aneurysmal and tortuous abdominal aorta. 3. Successful image guided placement of a non tunneled right triple-lumen central venous catheter with tip terminating within the superior cavoatrial junction. The central venous catheter is ready for immediate use. Electronically Signed   By: Sandi Mariscal M.D.   On: 09/15/2019 16:52   Ir US Guide Vasc Access Right  Result Date: 09/15/2019 INDICATION: Fall on anticoagulation, now with left-sided renal fractures and left renal laceration. Request made for left renal arteriogram and potential embolization. Request also made for placement of a image guided central venous catheter for durable intravenous access. EXAM: 1. ULTRASOUND GUIDANCE FOR VENOUS ACCESS 2. FLUOROSCOPIC GUIDED PLACEMENT OF A NON TUNNELED  TRIPLE-LUMEN CENTRAL VENOUS CATHETER. 3. ULTRASOUND GUIDANCE FOR ARTERIAL ACCESS 4. FLUSH ABDOMINAL AORTOGRAM 5. SELECTIVE LEFT RENAL ARTERIOGRAM 6. SUB SELECTIVE LEFT ARTERIOGRAM AND PERCUTANEOUS COIL EMBOLIZATION COMPARISON:  CT the chest, abdomen and pelvis - earlier same day MEDICATIONS: NONE ANESTHESIA/SEDATION: Moderate (conscious) sedation was employed during this procedure. A total of Versed 2 mg and Fentanyl 100 mcg was administered intravenously. Moderate Sedation Time: 85 minutes. The patient's level of consciousness and vital signs were monitored continuously by radiology nursing throughout the procedure under my direct supervision. CONTRAST:  75 cc Omnipaque 300 FLUOROSCOPY TIME:  24 minutes (0,981 mGy) COMPLICATIONS: None immediate. PROCEDURE: Informed consent was obtained from the patient following explanation of the procedure, risks, benefits and alternatives. The patient understands, agrees and consents for the procedure. All questions were addressed. A time out was performed prior to the initiation of the procedure. Maximal barrier sterile technique utilized including caps, mask, sterile gowns, sterile gloves, large sterile drape, hand hygiene, and Betadine prep. Attention was initially paid towards placement of a non tunneled central venous catheter. Under direct ultrasound guidance, the left internal jugular vein was accessed with a micropuncture kit. A micropuncture wire was utilized for measurement purposes. Under fluoroscopic guidance, a peel-away sheath was placed and a 15 cm triple-lumen non tunneled central venous catheter was inserted with tip terminating at the level the superior cavoatrial junction. Postprocedural spot fluoroscopic radiographic image was obtained. The central venous catheter was secured at the neck entrance site within interrupted suture. A dressing was applied. _________________________________________________________ The right femoral head was marked fluoroscopically.  Under ultrasound guidance, the right common femoral artery was accessed with a micropuncture kit after the overlying soft tissues were anesthetized with 1% lidocaine. An ultrasound image was saved for documentation purposes. The micropuncture sheath was exchanged for a 5 [place] vascular sheath over a Bentson wire. A closure arteriogram was performed through the side of the sheath confirming access within the right common femoral artery. Over a Bentson wire, a Mickelson catheter was advanced to the level of the inferior aspect of the thoracic aorta where it was back bled and flushed. The catheter was then utilized to select the left renal artery and a selective left renal arteriogram was performed. Next, with the use of a fathom 14 microcatheter, a regular Renegade microcatheter was advanced into a midpole division of the left renal artery. Sub selective arteriogram was performed and the vessel was percutaneously coil embolized with multiple overlapping 2 mm, 3 mm and 4 mm diameter interlock coils. Next, the microcatheter was utilized to an additional midpole division of the left renal artery and a sub selective arteriogram was performed. Next, the vessel was percutaneously coil embolized with multiple overlapping 4 mm, 5 mm and 6 mm interlock coils. The microcatheter was retracted to the level of the main left renal artery and selective left arteriogram was performed. The microcatheter was removed and a completion arteriogram was performed via the [facility] catheter. Next, prolonged efforts were made to cannulate the known tiny accessory left renal artery however this ultimately proved difficult. As such, a flush aortogram was performed demonstrating patency of the accessory left renal artery. Unfortunately, despite prolonged efforts, the tiny accessory left renal artery could not be selectively cannulated, likely secondary to a combination of suspected narrowing involving the vessel's origin (as was demonstrated on  preceding CT) as well as the patient's aneurysmal and tortuous abdominal aorta. At this point, the procedure was terminated. All wires, catheters and sheaths were removed patient. Hemostasis was achieved at  the right groin access site with deployment of an ExoSeal closure device and manual compression. A dressing applied. The patient tolerated the procedure well without immediate postprocedural complication. FINDINGS: Selective left renal arteriogram demonstrates marked irregularity and contrast extravasation arising from several midpole subsegmental renal arteries. As such, both co-dominant midpole segmental arteries were sub selective and percutaneously coil embolized. Completion arteriogram demonstrates a technically excellent result without evidence of residual contrast extravasation or vessel irregularity. Completion left renal arteriogram demonstrates patency of several superior pole segmental divisions without additional area contrast extravasation or vessel irregularity. Flush abdominal aortogram demonstrates patency of the known accessory tiny left renal artery. Despite prolonged efforts, the accessory left renal artery could not be selectively cannulated, likely secondary to a combination of suspected narrowing of the vessel's origin (as was demonstrated on preceding CT), as well as the patient's aneurysmal and tortuous abdominal aorta. IMPRESSION: 1. Technically successful percutaneous coil embolization of two mid pole segmental arteries supplying ill-defined areas of contrast extravasation and vessel irregularity. 2. Attempted, though unsuccessful, cannulation of the known tiny left accessory renal artery likely to secondary to a combination of suspected narrowing of the vessel's origin (as was demonstrated on preceding CT) as well as the patient's aneurysmal and tortuous abdominal aorta. 3. Successful image guided placement of a non tunneled right triple-lumen central venous catheter with tip  terminating within the superior cavoatrial junction. The central venous catheter is ready for immediate use. Electronically Signed   By: Sandi Mariscal M.D.   On: 09/15/2019 16:52   Dg Chest Port 1 View  Result Date: 10/06/2019 CLINICAL DATA:  Dyspnea EXAM: PORTABLE CHEST 1 VIEW COMPARISON:  10/01/2019 FINDINGS: Stable cardiac enlargement. Aortic atherosclerosis. Chronic asymmetric elevation of the left hemidiaphragm is noted with overlying platelike atelectasis. Pulmonary vascular congestion is noted. IMPRESSION: 1. Cardiac enlargement and pulmonary vascular congestion. 2. Asymmetric elevation of left hemidiaphragm with left base atelectasis. Electronically Signed   By: Kerby Moors M.D.   On: 10/06/2019 17:35   Dg Chest Port 1 View  Result Date: 10/01/2019 CLINICAL DATA:  85 year old male with known right rib fractures from earlier fall. Concern for aspiration. EXAM: PORTABLE CHEST 1 VIEW COMPARISON:  Chest radiograph dated 09/23/2019 FINDINGS: Elevation of the left hemidiaphragm with left lung base atelectasis similar to prior radiograph. An area of apparent increased density in the right suprahilar region likely related to patient rotation and projection of mediastinal structure. Developing infiltrate is favored less likely. Clinical correlation is recommended. No large pleural effusion. There is no pneumothorax. Stable cardiomediastinal silhouette. Atherosclerotic calcification of the aorta. No acute osseous pathology. IMPRESSION: No interval change. Electronically Signed   By: Anner Crete M.D.   On: 10/01/2019 00:51   Dg Chest Port 1 View  Result Date: 09/23/2019 CLINICAL DATA:  Multiple closed left rib fractures. EXAM: PORTABLE CHEST 1 VIEW COMPARISON:  09/17/2019 FINDINGS: Elevation of the left hemidiaphragm with left base atelectasis. No pneumothorax. Coarsened interstitial opacities throughout the lungs could reflect chronic interstitial lung disease. Cardiomegaly. Multiple left rib  fractures are noted. IMPRESSION: Stable exam. Electronically Signed   By: Rolm Baptise M.D.   On: 09/23/2019 08:47   Dg Chest Port 1 View  Result Date: 09/17/2019 CLINICAL DATA:  Shortness of breath.  Wheezing.  Weakness. EXAM: PORTABLE CHEST 1 VIEW COMPARISON:  09/16/2019; CT of the chest, abdomen and pelvis-09/15/2019 FINDINGS: Grossly unchanged enlarged cardiac silhouette and mediastinal contours with atherosclerotic plaque within a tortuous thoracic aorta. Stable position of support apparatus. There is persistent elevation/eventration of  the left hemidiaphragm. Nodular heterogeneous opacities within the right lung are unchanged. No new focal airspace opacities. No evidence of edema. No acute or aggressive osseous abnormalities. Persistent gaseous distention of the colon. IMPRESSION: 1. Persistent mild elevation of the left hemidiaphragm with associated bibasilar opacities, likely atelectasis. 2.  Aortic Atherosclerosis (ICD10-I70.0). Electronically Signed   By: Sandi Mariscal M.D.   On: 09/17/2019 08:46   Dg Chest Port 1 View  Result Date: 09/16/2019 CLINICAL DATA:  Rib fractures -left EXAM: PORTABLE CHEST 1 VIEW COMPARISON:  09/15/2019 FINDINGS: RIGHT-sided central line tip overlies the superior vena cava. There is persistent cardiomegaly, stable in configuration. Dense atherosclerotic calcification of the thoracic aorta. Stable elevation of the LEFT hemidiaphragm. Interstitial thickening, likely related to mild edema. Distended bowel loops beneath the hemidiaphragms. IMPRESSION: 1. Stable cardiomegaly and mild edema. 2. Stable elevation of the LEFT hemidiaphragm. 3. Distended bowel loops beneath the hemidiaphragms. Electronically Signed   By: Nolon Nations M.D.   On: 09/16/2019 09:56   Dg Chest Port 1 View  Result Date: 09/15/2019 CLINICAL DATA:  Fall with chest pain EXAM: PORTABLE CHEST 1 VIEW COMPARISON:  Chest CT report 12/26/2009 FINDINGS: Low volume chest with generalized interstitial  coarsening. There is asymmetric elevation of the left diaphragm not described on prior study. Borderline heart size given portable technique and rightward rotation. Aortic tortuosity. IMPRESSION: 1. Low volume chest interstitial coarsening, question pulmonary fibrosis. 2. Prominent elevation of the left diaphragm. 3. No definite acute finding. Electronically Signed   By: Monte Fantasia M.D.   On: 09/15/2019 10:06   Dg Swallowing Func-speech Pathology  Result Date: 09/20/2019 Objective Swallowing Evaluation: Type of Study: MBS-Modified Barium Swallow Study  Patient Details Name: [name] [name] MRN: [id number] Date of Birth: 28-May-1923 Today's Date: 09/20/2019 Time: SLP Start Time (ACUTE ONLY): 1100 -SLP Stop Time (ACUTE ONLY): 1120 SLP Time Calculation (min) (ACUTE ONLY): 20 min Past Medical History: Past Medical History: Diagnosis Date . Arthritis  . Atrial fibrillation ([place])  . Cancer [facility])   colon ca - surgery, no chemo or radiation . Hypertension  . Right shoulder pain 09/30/12  recently Past Surgical History: Past Surgical History: Procedure Laterality Date . CATARACT EXTRACTION W/ INTRAOCULAR LENS IMPLANT    b/l . colon surgery for colon cancer  approx. 2006 . IR EMBO ART  VEN HEMORR LYMPH EXTRAV  INC GUIDE ROADMAPPING  09/15/2019 . IR FLUORO GUIDE CV LINE RIGHT  09/15/2019 . IR RENAL SUPRASEL UNI S&I MOD SED  09/15/2019 . IR US GUIDE VASC ACCESS RIGHT  09/15/2019 . IR US GUIDE VASC ACCESS RIGHT  09/15/2019 HPI: Pt is a 85 yo male admitted s/p fall with L rib fxs 7-11 and L kidney lac s/p IR emobolization 10/14. Pt was advancing his diet but developed increased SOB. CXR 10/16 showed bibasilar opacities, likely atelectasis. SLP swallow evaluation was ordered. PMH includes: HTN, colon cancer, Afib, arthritis, and self-reported GER  Subjective: pt alert, pleasant, SOB Assessment / Plan / Recommendation CHL IP CLINICAL IMPRESSIONS 09/20/2019 Clinical Impression Pt has a moderate oropharyngeal and cervical  esophageal dysphagia, felt to be acute on chronic in nature. He has delayed oral transit with reduced bolus cohesion and mild oral residuals, suspect related to acute deconditioning. His oral residue often spills posteriorly to the valleculae, but he clears oral residue well with a second swallow. Small amounts of penetration occur during the swallow with thin liquids, but most of his penetration and aspiration occur after the swallow. His UES appears to be tight, with  boluses partially entering his esophagus but then backflowing up into the pharynx. This backflow often spills posteriorly over the arytenoids and into the larynx. It is difficult to visualize beyond the vocal folds, but silent aspiration was clearly observed with nectar thick liquids and highly suspected with other consistencies. He had the least amount of pharyngeal clearance with soft solids, which were repeatedly penetrated, cleared with reflexive swallows, and then unfortunately repenetrated. Discussed with trauma PA my concern for aspiration across meals regardless of textures, which is likely not acute given what appears to be a primary esophageal source, but he is more likely to develop complications in his deconditioned state. Pt says that he has had his esophagus stretched in the past. Could consider GI involvement. Pt may be appropriate for comfort feeds, accepting the risk of aspiration but trying to reduce it as much as possible with small, single bites/sips of soft purees and thin liquids (although of course if pt is fully accepting of aspiration risk, he could try more solid foods as well). Thorough and frequent oral care would be important. Would use aspiration and esophageal precautions and provide assistance with careful hand feeding. Per PA, she and/or MD will discuss with pt further before making any decisions. Will continue to follow.  SLP Visit Diagnosis Dysphagia, pharyngoesophageal phase (R13.14) Attention and concentration  deficit following -- Frontal lobe and executive function deficit following -- Impact on safety and function Moderate aspiration risk;Risk for inadequate nutrition/hydration   CHL IP TREATMENT RECOMMENDATION 09/20/2019 Treatment Recommendations Therapy as outlined in treatment plan below   Prognosis 09/20/2019 Prognosis for Safe Diet Advancement Fair Barriers to Reach Goals Time post onset Barriers/Prognosis Comment -- CHL IP DIET RECOMMENDATION 09/20/2019 SLP Diet Recommendations Other (Comment) Liquid Administration via -- Medication Administration Crushed with puree Compensations -- Postural Changes --   CHL IP OTHER RECOMMENDATIONS 09/20/2019 Recommended Consults Consider GI evaluation Oral Care Recommendations Oral care QID Other Recommendations Have oral suction available   CHL IP FOLLOW UP RECOMMENDATIONS 09/20/2019 Follow up Recommendations 24 hour supervision/assistance   CHL IP FREQUENCY AND DURATION 09/20/2019 Speech Therapy Frequency (ACUTE ONLY) min 2x/week Treatment Duration 2 weeks      CHL IP ORAL PHASE 09/20/2019 Oral Phase Impaired Oral - Pudding Teaspoon -- Oral - Pudding Cup -- Oral - Honey Teaspoon -- Oral - Honey Cup -- Oral - Nectar Teaspoon -- Oral - Nectar Cup -- Oral - Nectar Straw Decreased bolus cohesion;Delayed oral transit;Lingual/palatal residue;Weak lingual manipulation;Reduced posterior propulsion Oral - Thin Teaspoon -- Oral - Thin Cup Decreased bolus cohesion;Delayed oral transit;Lingual/palatal residue;Weak lingual manipulation;Reduced posterior propulsion Oral - Thin Straw Decreased bolus cohesion;Delayed oral transit;Lingual/palatal residue;Weak lingual manipulation;Reduced posterior propulsion Oral - Puree Decreased bolus cohesion;Delayed oral transit;Lingual/palatal residue;Weak lingual manipulation;Reduced posterior propulsion Oral - Mech Soft Decreased bolus cohesion;Delayed oral transit;Lingual/palatal residue;Weak lingual manipulation;Reduced posterior propulsion Oral -  Regular -- Oral - Multi-Consistency -- Oral - Pill -- Oral Phase - Comment --  CHL IP PHARYNGEAL PHASE 09/20/2019 Pharyngeal Phase Impaired Pharyngeal- Pudding Teaspoon -- Pharyngeal -- Pharyngeal- Pudding Cup -- Pharyngeal -- Pharyngeal- Honey Teaspoon -- Pharyngeal -- Pharyngeal- Honey Cup -- Pharyngeal -- Pharyngeal- Nectar Teaspoon -- Pharyngeal -- Pharyngeal- Nectar Cup -- Pharyngeal -- Pharyngeal- Nectar Straw Penetration/Apiration after swallow;Pharyngeal residue - cp segment;Pharyngeal residue - pyriform Pharyngeal Material enters airway, passes BELOW cords without attempt by patient to eject out (silent aspiration) Pharyngeal- Thin Teaspoon -- Pharyngeal -- Pharyngeal- Thin Cup Penetration/Aspiration during swallow;Penetration/Apiration after swallow;Pharyngeal residue - cp segment;Pharyngeal residue - pyriform Pharyngeal Material enters  airway, CONTACTS cords and not ejected out Pharyngeal- Thin Straw Penetration/Aspiration during swallow;Penetration/Apiration after swallow;Pharyngeal residue - cp segment;Pharyngeal residue - pyriform Pharyngeal Material enters airway, CONTACTS cords and not ejected out Pharyngeal- Puree Penetration/Apiration after swallow;Pharyngeal residue - cp segment;Pharyngeal residue - pyriform Pharyngeal Material enters airway, remains ABOVE vocal cords and not ejected out Pharyngeal- Mechanical Soft Penetration/Apiration after swallow;Pharyngeal residue - cp segment;Pharyngeal residue - pyriform;Penetration/Aspiration during swallow Pharyngeal Material enters airway, remains ABOVE vocal cords and not ejected out Pharyngeal- Regular -- Pharyngeal -- Pharyngeal- Multi-consistency -- Pharyngeal -- Pharyngeal- Pill -- Pharyngeal -- Pharyngeal Comment --  CHL IP CERVICAL ESOPHAGEAL PHASE 09/20/2019 Cervical Esophageal Phase Impaired Pudding Teaspoon -- Pudding Cup -- Honey Teaspoon -- Honey Cup -- Nectar Teaspoon -- Nectar Cup -- Nectar Straw Reduced cricopharyngeal  relaxation;Esophageal backflow into the pharynx;Esophageal backflow into the larynx Thin Teaspoon -- Thin Cup Reduced cricopharyngeal relaxation;Esophageal backflow into the pharynx;Esophageal backflow into the larynx Thin Straw Reduced cricopharyngeal relaxation;Esophageal backflow into the pharynx;Esophageal backflow into the larynx Puree Reduced cricopharyngeal relaxation;Esophageal backflow into the pharynx Mechanical Soft Reduced cricopharyngeal relaxation;Esophageal backflow into the pharynx;Esophageal backflow into the larynx Regular -- Multi-consistency -- Pill -- Cervical Esophageal Comment -- Venita Sheffield Nix 09/20/2019, 9:40 AM  Pollyann Glen, M.A. [place] [facility] Pager [phone] Office [phone]             [place] [facility] Guide Roadmapping  Result Date: 09/15/2019 INDICATION: Fall on anticoagulation, now with left-sided renal fractures and left renal laceration. Request made for left renal arteriogram and potential embolization. Request also made for placement of a image guided central venous catheter for durable intravenous access. EXAM: 1. ULTRASOUND GUIDANCE FOR VENOUS ACCESS 2. FLUOROSCOPIC GUIDED PLACEMENT OF A NON TUNNELED TRIPLE-LUMEN CENTRAL VENOUS CATHETER. 3. ULTRASOUND GUIDANCE FOR ARTERIAL ACCESS 4. FLUSH ABDOMINAL AORTOGRAM 5. SELECTIVE LEFT RENAL ARTERIOGRAM 6. SUB SELECTIVE LEFT ARTERIOGRAM AND PERCUTANEOUS COIL EMBOLIZATION COMPARISON:  CT the chest, abdomen and pelvis - earlier same day MEDICATIONS: NONE ANESTHESIA/SEDATION: Moderate (conscious) sedation was employed during this procedure. A total of Versed 2 mg and Fentanyl 100 mcg was administered intravenously. Moderate Sedation Time: 85 minutes. The patient's level of consciousness and vital signs were monitored continuously by radiology nursing throughout the procedure under my direct supervision. CONTRAST:  75 cc Omnipaque 300 FLUOROSCOPY TIME:  24 minutes (8,657 mGy)  COMPLICATIONS: None immediate. PROCEDURE: Informed consent was obtained from the patient following explanation of the procedure, risks, benefits and alternatives. The patient understands, agrees and consents for the procedure. All questions were addressed. A time out was performed prior to the initiation of the procedure. Maximal barrier sterile technique utilized including caps, mask, sterile gowns, sterile gloves, large sterile drape, hand hygiene, and Betadine prep. Attention was initially paid towards placement of a non tunneled central venous catheter. Under direct ultrasound guidance, the left internal jugular vein was accessed with a micropuncture kit. A micropuncture wire was utilized for measurement purposes. Under fluoroscopic guidance, a peel-away sheath was placed and a 15 cm triple-lumen non tunneled central venous catheter was inserted with tip terminating at the level the superior cavoatrial junction. Postprocedural spot fluoroscopic radiographic image was obtained. The central venous catheter was secured at the neck entrance site within interrupted suture. A dressing was applied. _________________________________________________________ The right femoral head was marked fluoroscopically. Under ultrasound guidance, the right common femoral artery was accessed with a micropuncture kit after the overlying soft tissues were anesthetized with 1% lidocaine. An ultrasound image was saved  for documentation purposes. The micropuncture sheath was exchanged for a 5 [place] vascular sheath over a Bentson wire. A closure arteriogram was performed through the side of the sheath confirming access within the right common femoral artery. Over a Bentson wire, a Mickelson catheter was advanced to the level of the inferior aspect of the thoracic aorta where it was back bled and flushed. The catheter was then utilized to select the left renal artery and a selective left renal arteriogram was performed. Next, with the use  of a fathom 14 microcatheter, a regular Renegade microcatheter was advanced into a midpole division of the left renal artery. Sub selective arteriogram was performed and the vessel was percutaneously coil embolized with multiple overlapping 2 mm, 3 mm and 4 mm diameter interlock coils. Next, the microcatheter was utilized to an additional midpole division of the left renal artery and a sub selective arteriogram was performed. Next, the vessel was percutaneously coil embolized with multiple overlapping 4 mm, 5 mm and 6 mm interlock coils. The microcatheter was retracted to the level of the main left renal artery and selective left arteriogram was performed. The microcatheter was removed and a completion arteriogram was performed via the [facility] catheter. Next, prolonged efforts were made to cannulate the known tiny accessory left renal artery however this ultimately proved difficult. As such, a flush aortogram was performed demonstrating patency of the accessory left renal artery. Unfortunately, despite prolonged efforts, the tiny accessory left renal artery could not be selectively cannulated, likely secondary to a combination of suspected narrowing involving the vessel's origin (as was demonstrated on preceding CT) as well as the patient's aneurysmal and tortuous abdominal aorta. At this point, the procedure was terminated. All wires, catheters and sheaths were removed patient. Hemostasis was achieved at the right groin access site with deployment of an ExoSeal closure device and manual compression. A dressing applied. The patient tolerated the procedure well without immediate postprocedural complication. FINDINGS: Selective left renal arteriogram demonstrates marked irregularity and contrast extravasation arising from several midpole subsegmental renal arteries. As such, both co-dominant midpole segmental arteries were sub selective and percutaneously coil embolized. Completion arteriogram demonstrates a  technically excellent result without evidence of residual contrast extravasation or vessel irregularity. Completion left renal arteriogram demonstrates patency of several superior pole segmental divisions without additional area contrast extravasation or vessel irregularity. Flush abdominal aortogram demonstrates patency of the known accessory tiny left renal artery. Despite prolonged efforts, the accessory left renal artery could not be selectively cannulated, likely secondary to a combination of suspected narrowing of the vessel's origin (as was demonstrated on preceding CT), as well as the patient's aneurysmal and tortuous abdominal aorta. IMPRESSION: 1. Technically successful percutaneous coil embolization of two mid pole segmental arteries supplying ill-defined areas of contrast extravasation and vessel irregularity. 2. Attempted, though unsuccessful, cannulation of the known tiny left accessory renal artery likely to secondary to a combination of suspected narrowing of the vessel's origin (as was demonstrated on preceding CT) as well as the patient's aneurysmal and tortuous abdominal aorta. 3. Successful image guided placement of a non tunneled right triple-lumen central venous catheter with tip terminating within the superior cavoatrial junction. The central venous catheter is ready for immediate use. Electronically Signed   By: Sandi Mariscal M.D.   On: 09/15/2019 16:52   Vas [place] Lower Extremity Venous (dvt)  Result Date: 10/07/2019  Lower Venous Study Indications: Swelling.  Comparison Study: No prior study. Performing Technologist: Maudry Mayhew MHA, RDMS, RVT, RDCS  Examination Guidelines: A complete  evaluation includes B-mode imaging, spectral Doppler, color Doppler, and power Doppler as needed of all accessible portions of each vessel. Bilateral testing is considered an integral part of a complete examination. Limited examinations for reoccurring indications may be performed as noted.   +---------+---------------+---------+-----------+----------+--------------+ RIGHT    CompressibilityPhasicitySpontaneityPropertiesThrombus Aging +---------+---------------+---------+-----------+----------+--------------+ CFV      Full           Yes      Yes                                 +---------+---------------+---------+-----------+----------+--------------+ SFJ      Full                                                        +---------+---------------+---------+-----------+----------+--------------+ FV Prox  Full                                                        +---------+---------------+---------+-----------+----------+--------------+ FV Mid   Full                                                        +---------+---------------+---------+-----------+----------+--------------+ FV DistalFull                                                        +---------+---------------+---------+-----------+----------+--------------+ PFV      Full                                                        +---------+---------------+---------+-----------+----------+--------------+ POP      Full           Yes      Yes                                 +---------+---------------+---------+-----------+----------+--------------+ PTV      Full                                                        +---------+---------------+---------+-----------+----------+--------------+  +---------+---------------+---------+-----------+----------+--------------+ LEFT     CompressibilityPhasicitySpontaneityPropertiesThrombus Aging +---------+---------------+---------+-----------+----------+--------------+ CFV      Full           Yes      Yes                                 +---------+---------------+---------+-----------+----------+--------------+  SFJ      Full                                                         +---------+---------------+---------+-----------+----------+--------------+ FV Prox  Full                                                        +---------+---------------+---------+-----------+----------+--------------+ FV Mid   Full                                                        +---------+---------------+---------+-----------+----------+--------------+ FV DistalFull                                                        +---------+---------------+---------+-----------+----------+--------------+ PFV      Full                                                        +---------+---------------+---------+-----------+----------+--------------+ POP      Full           Yes      Yes                                 +---------+---------------+---------+-----------+----------+--------------+ PTV      Full                                                        +---------+---------------+---------+-----------+----------+--------------+ PERO     Full                                                        +---------+---------------+---------+-----------+----------+--------------+  Summary: Right: There is no evidence of deep vein thrombosis in the lower extremity. However, portions of this examination were limited- see technologist comments above. No cystic structure found in the popliteal fossa. Left: There is no evidence of deep vein thrombosis in the lower extremity. No cystic structure found in the popliteal fossa.  *See table(s) above for measurements and observations. Electronically signed by Monica Martinez MD on 10/07/2019 at 3:41:09 PM.    Final     Time coordinating discharge: Over 30 minutes  SIGNED:   Guilford Shi, MD  [facility] 10/08/2019, 1:02 PM Pager : [phone]

## 2019-10-09 LAB — PARATHYROID HORMONE, INTACT (NO CA): PTH: 17 pg/mL (ref 15–65)

## 2019-11-02 DEATH — deceased

## 2021-06-27 IMAGING — DX DG CHEST 1V PORT
1 series · 1 of 1 positions shown · non-contrast
Comparison: 10/01/2019

CLINICAL DATA: Dyspnea

EXAM:
PORTABLE CHEST 1 VIEW

[chest ap]
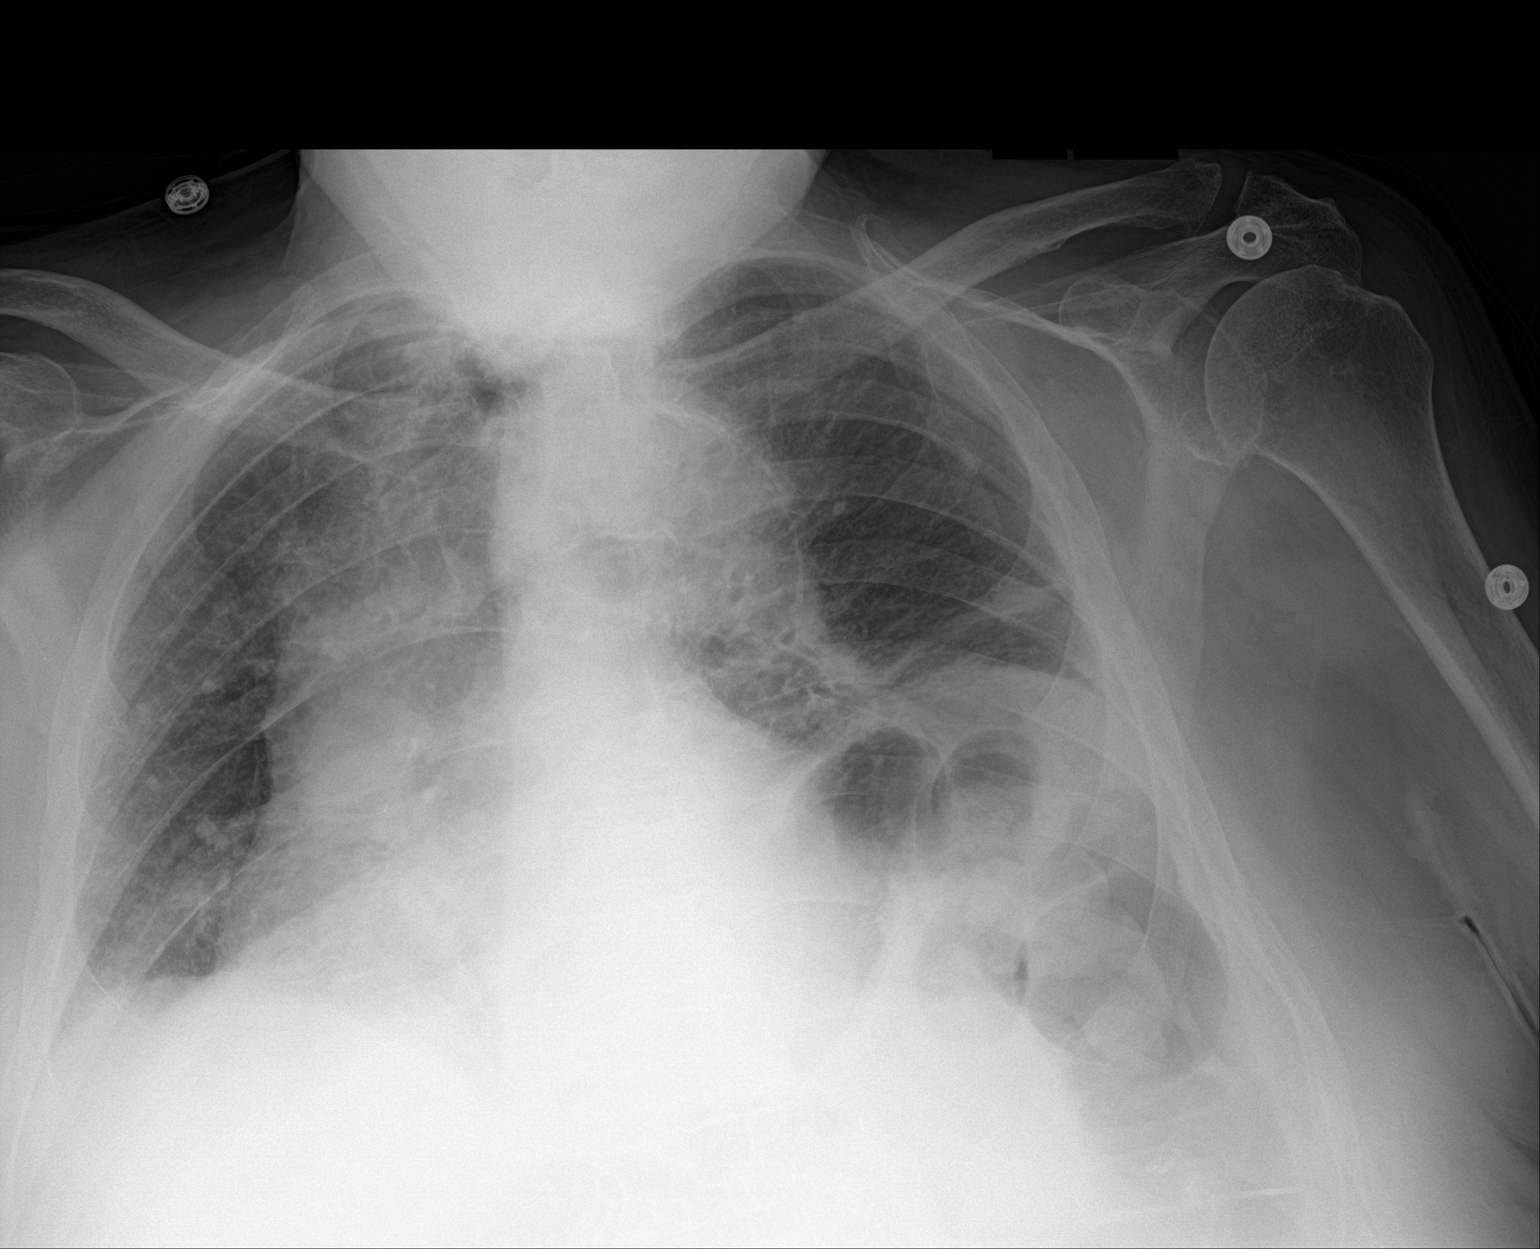

[1 of 1 positions shown; findings below may reference images not displayed]

FINDINGS: Stable cardiac enlargement. Aortic atherosclerosis. Chronic
asymmetric elevation of the left hemidiaphragm is noted with
overlying platelike atelectasis. Pulmonary vascular congestion is
noted.
IMPRESSION: 1. Cardiac enlargement and pulmonary vascular congestion.
2. Asymmetric elevation of left hemidiaphragm with left base
atelectasis.
